# Patient Record
Sex: Male | Born: 1960 | Race: Black or African American | Hispanic: No | Smoking: Former smoker
Health system: Southern US, Community
[De-identification: ages and names within clinical notes are randomized; demographics above are authoritative.]

## PROBLEM LIST (undated history)

## (undated) DIAGNOSIS — J189 Pneumonia, unspecified organism: Secondary | ICD-10-CM

## (undated) DIAGNOSIS — C4491 Basal cell carcinoma of skin, unspecified: Secondary | ICD-10-CM

## (undated) DIAGNOSIS — C801 Malignant (primary) neoplasm, unspecified: Secondary | ICD-10-CM

## (undated) HISTORY — DX: Basal cell carcinoma of skin, unspecified: C44.91

## (undated) HISTORY — PX: APPENDECTOMY: SHX54

---

## 2006-01-29 ENCOUNTER — Ambulatory Visit (HOSPITAL_COMMUNITY): Admission: RE | Admit: 2006-01-29 | Discharge: 2006-01-29 | Payer: Self-pay

## 2020-04-17 DIAGNOSIS — C189 Malignant neoplasm of colon, unspecified: Secondary | ICD-10-CM

## 2020-04-17 HISTORY — DX: Malignant neoplasm of colon, unspecified: C18.9

## 2020-05-09 ENCOUNTER — Telehealth: Payer: Self-pay | Admitting: Hematology and Oncology

## 2020-05-09 NOTE — Telephone Encounter (Signed)
Created in error

## 2020-05-10 ENCOUNTER — Other Ambulatory Visit: Payer: Self-pay | Admitting: Surgery

## 2020-05-10 DIAGNOSIS — C189 Malignant neoplasm of colon, unspecified: Secondary | ICD-10-CM

## 2020-05-24 ENCOUNTER — Ambulatory Visit (HOSPITAL_BASED_OUTPATIENT_CLINIC_OR_DEPARTMENT_OTHER): Admission: RE | Admit: 2020-05-24 | Payer: No Typology Code available for payment source | Source: Ambulatory Visit

## 2020-06-04 ENCOUNTER — Ambulatory Visit
Admission: RE | Admit: 2020-06-04 | Discharge: 2020-06-04 | Disposition: A | Discharge status: Home / Self Care | Payer: No Typology Code available for payment source | Source: Ambulatory Visit | Attending: Surgery | Admitting: Surgery

## 2020-06-04 DIAGNOSIS — C189 Malignant neoplasm of colon, unspecified: Secondary | ICD-10-CM

## 2020-06-04 MED ORDER — IOPAMIDOL (ISOVUE-300) INJECTION 61%
75.0000 mL | Freq: Once | INTRAVENOUS | Status: AC | PRN
Start: 2020-06-04 — End: 2020-06-04
  Administered 2020-06-04: 75 mL via INTRAVENOUS

## 2020-06-05 NOTE — Patient Instructions (Signed)
DUE TO COVID-19 ONLY ONE VISITOR IS ALLOWED TO COME WITH YOU AND STAY IN THE WAITING ROOM ONLY DURING PRE OP AND PROCEDURE DAY OF SURGERY. THE 1 VISITOR  MAY VISIT WITH YOU AFTER SURGERY IN YOUR PRIVATE ROOM DURING VISITING HOURS ONLY!  YOU NEED TO HAVE A COVID 19 TEST ON Jun 11, 2020 @ 10:55 am, THIS TEST MUST BE DONE BEFORE SURGERY,  COVID TESTING SITE [address] [place] [address], IT IS ON THE RIGHT GOING OUT [address] APPROXIMATELY  2 MINUTES PAST [employer] ON THE RIGHT. ONCE YOUR COVID TEST IS COMPLETED,  PLEASE BEGIN THE QUARANTINE INSTRUCTIONS AS OUTLINED IN YOUR HANDOUT.                Andrew Hines  06/05/2020   Your procedure is scheduled on: Jun 13, 2020   Report to [facility] Main  Entrance   Report to admitting at 12:45 PM     Call this number if you have problems the morning of surgery [phone]    Remember: Do not eat food after Midnight. Clear liquids from midnight till 11:45 am day of surgery then nothing by mouth.  BRUSH YOUR TEETH MORNING OF SURGERY AND RINSE YOUR MOUTH OUT, NO CHEWING GUM CANDY OR MINTS.     Take these medicines the morning of surgery with A SIP OF WATER:   DO NOT TAKE ANY DIABETIC MEDICATIONS DAY OF YOUR SURGERY                               You may not have any metal on your body including hair pins and              piercings  Do not wear jewelry, make-up, lotions, powders or perfumes, deodorant             Do not wear nail polish on your fingernails.  Do not shave  48 hours prior to surgery.              Men may shave face and neck.   Do not bring valuables to the hospital. [place].  Contacts, dentures or bridgework may not be worn into surgery.  Leave suitcase in the car. After surgery it may be brought to your room.     Patients discharged the day of surgery will not be allowed to drive home. IF YOU ARE HAVING SURGERY AND GOING HOME THE SAME DAY,  YOU MUST HAVE AN ADULT TO DRIVE YOU HOME AND BE WITH YOU FOR 24 HOURS. YOU MAY GO HOME BY TAXI OR UBER OR ORTHERWISE, BUT AN ADULT MUST ACCOMPANY YOU HOME AND STAY WITH YOU FOR 24 HOURS.  Name and phone number of your driver:  Special Instructions: N/A              Please read over the following fact sheets you were given: _____________________________________________________________________                [place]  Foods Excluded  Coffee and tea, regular and decaf                             liquids that you cannot  Plain Jell-O any favor except red or purple                                           see through such as: Fruit ices (not with fruit pulp)                                     milk, soups, orange juice  Iced Popsicles                                    All solid food Carbonated beverages, regular and diet                                    Cranberry, grape and apple juices Sports drinks like Gatorade Lightly seasoned clear broth or consume(fat free) Sugar, honey syrup  Sample Menu Breakfast                                Lunch                                     Supper Cranberry juice                    Beef broth                            Chicken broth Jell-O                                     Grape juice                           Apple juice Coffee or tea                        Jell-O                                      Popsicle                                                Coffee or tea                        Coffee or tea  _____________________________________________________________________  [facility] - Preparing for Surgery Before surgery, you can play an important role.  Because skin is not sterile, your skin  needs to be as free of germs as possible.  You can reduce the number of germs on your skin by washing with CHG (chlorahexidine gluconate) soap before  surgery.  CHG is an antiseptic cleaner which kills germs and bonds with the skin to continue killing germs even after washing. Please DO NOT use if you have an allergy to CHG or antibacterial soaps.  If your skin becomes reddened/irritated stop using the CHG and inform your nurse when you arrive at Short Stay. Do not shave (including legs and underarms) for at least 48 hours prior to the first CHG shower.  You may shave your face/neck. Please follow these instructions carefully:  1.  Shower with CHG Soap the night before surgery and the  morning of Surgery.  2.  If you choose to wash your hair, wash your hair first as usual with your  normal  shampoo.  3.  After you shampoo, rinse your hair and body thoroughly to remove the  shampoo.                           4.  Use CHG as you would any other liquid soap.  You can apply chg directly  to the skin and wash                       Gently with a scrungie or clean washcloth.  5.  Apply the CHG Soap to your body ONLY FROM THE NECK DOWN.   Do not use on face/ open                           Wound or open sores. Avoid contact with eyes, ears mouth and genitals (private parts).                       Wash face,  Genitals (private parts) with your normal soap.             6.  Wash thoroughly, paying special attention to the area where your surgery  will be performed.  7.  Thoroughly rinse your body with warm water from the neck down.  8.  DO NOT shower/wash with your normal soap after using and rinsing off  the CHG Soap.                9.  Pat yourself dry with a clean towel.            10.  Wear clean pajamas.            11.  Place clean sheets on your bed the night of your first shower and do not  sleep with pets. Day of Surgery : Do not apply any lotions/deodorants the morning of surgery.  Please wear clean clothes to the hospital/surgery center.  FAILURE TO FOLLOW THESE INSTRUCTIONS MAY RESULT IN THE CANCELLATION OF YOUR SURGERY PATIENT  SIGNATURE_________________________________  NURSE SIGNATURE__________________________________  ________________________________________________________________________ [facility] - Preparing for Surgery Before surgery, you can play an important role.  Because skin is not sterile, your skin needs to be as free of germs as possible.  You can reduce the number of germs on your skin by washing with CHG (chlorahexidine gluconate) soap before surgery.  CHG is an antiseptic cleaner which kills germs and bonds with the skin to continue killing germs even after washing. Please DO NOT use if you have  an allergy to CHG or antibacterial soaps.  If your skin becomes reddened/irritated stop using the CHG and inform your nurse when you arrive at Short Stay. Do not shave (including legs and underarms) for at least 48 hours prior to the first CHG shower.  You may shave your face/neck. Please follow these instructions carefully:  1.  Shower with CHG Soap the night before surgery and the  morning of Surgery.  2.  If you choose to wash your hair, wash your hair first as usual with your  normal  shampoo.  3.  After you shampoo, rinse your hair and body thoroughly to remove the  shampoo.                           4.  Use CHG as you would any other liquid soap.  You can apply chg directly  to the skin and wash                       Gently with a scrungie or clean washcloth.  5.  Apply the CHG Soap to your body ONLY FROM THE NECK DOWN.   Do not use on face/ open                           Wound or open sores. Avoid contact with eyes, ears mouth and genitals (private parts).                       Wash face,  Genitals (private parts) with your normal soap.             6.  Wash thoroughly, paying special attention to the area where your surgery  will be performed.  7.  Thoroughly rinse your body with warm water from the neck down.  8.  DO NOT shower/wash with your normal soap after using and rinsing off  the CHG Soap.                 9.  Pat yourself dry with a clean towel.            10.  Wear clean pajamas.            11.  Place clean sheets on your bed the night of your first shower and do not  sleep with pets. Day of Surgery : Do not apply any lotions/deodorants the morning of surgery.  Please wear clean clothes to the hospital/surgery center.  FAILURE TO FOLLOW THESE INSTRUCTIONS MAY RESULT IN THE CANCELLATION OF YOUR SURGERY PATIENT SIGNATURE_________________________________  NURSE SIGNATURE__________________________________  ________________________________________________________________________

## 2020-06-05 NOTE — Progress Notes (Signed)
Please place surgical orders in epic for surgical procedure scheduled on Jun 13, 2020. Thank You.

## 2020-06-06 ENCOUNTER — Encounter (HOSPITAL_COMMUNITY)
Admission: RE | Admit: 2020-06-06 | Discharge: 2020-06-06 | Disposition: A | Discharge status: Home / Self Care | Payer: No Typology Code available for payment source | Source: Ambulatory Visit | Attending: Surgery | Admitting: Surgery

## 2020-06-06 ENCOUNTER — Ambulatory Visit: Payer: Self-pay | Admitting: Surgery

## 2020-06-06 ENCOUNTER — Other Ambulatory Visit: Payer: Self-pay

## 2020-06-06 ENCOUNTER — Encounter (HOSPITAL_COMMUNITY): Payer: Self-pay

## 2020-06-06 DIAGNOSIS — Z01812 Encounter for preprocedural laboratory examination: Secondary | ICD-10-CM | POA: Insufficient documentation

## 2020-06-06 HISTORY — DX: Malignant (primary) neoplasm, unspecified: C80.1

## 2020-06-06 HISTORY — DX: Pneumonia, unspecified organism: J18.9

## 2020-06-06 LAB — CBC
HCT: 41 % (ref 39.0–52.0)
Hemoglobin: 13.1 g/dL (ref 13.0–17.0)
MCH: 28.4 pg (ref 26.0–34.0)
MCHC: 32 g/dL (ref 30.0–36.0)
MCV: 88.9 fL (ref 80.0–100.0)
Platelets: 321 K/uL (ref 150–400)
RBC: 4.61 MIL/uL (ref 4.22–5.81)
RDW: 13.3 % (ref 11.5–15.5)
WBC: 4.9 K/uL (ref 4.0–10.5)
nRBC: 0 % (ref 0.0–0.2)

## 2020-06-06 LAB — DIFFERENTIAL
Abs Immature Granulocytes: 0.01 10*3/uL (ref 0.00–0.07)
Basophils Absolute: 0 10*3/uL (ref 0.0–0.1)
Basophils Relative: 1 %
Eosinophils Absolute: 0 10*3/uL (ref 0.0–0.5)
Eosinophils Relative: 1 %
Immature Granulocytes: 0 %
Lymphocytes Relative: 25 %
Lymphs Abs: 1.3 10*3/uL (ref 0.7–4.0)
Monocytes Absolute: 0.5 10*3/uL (ref 0.1–1.0)
Monocytes Relative: 11 %
Neutro Abs: 3.1 10*3/uL (ref 1.7–7.7)
Neutrophils Relative %: 62 %

## 2020-06-06 LAB — COMPREHENSIVE METABOLIC PANEL WITH GFR
ALT: 12 U/L (ref 0–44)
AST: 32 U/L (ref 15–41)
Albumin: 4 g/dL (ref 3.5–5.0)
Alkaline Phosphatase: 77 U/L (ref 38–126)
Anion gap: 10 (ref 5–15)
BUN: 14 mg/dL (ref 8–23)
CO2: 22 mmol/L (ref 22–32)
Calcium: 9.3 mg/dL (ref 8.9–10.3)
Chloride: 110 mmol/L (ref 98–111)
Creatinine, Ser: 1 mg/dL (ref 0.61–1.24)
GFR, Estimated: >60 mL/min (ref >60)
Glucose, Bld: 92 mg/dL (ref 70–99)
Potassium: 4.3 mmol/L (ref 3.5–5.1)
Sodium: 142 mmol/L (ref 135–145)
Total Bilirubin: 0.9 mg/dL (ref 0.3–1.2)
Total Protein: 7.7 g/dL (ref 6.5–8.1)

## 2020-06-06 NOTE — Progress Notes (Addendum)
Called [facility] and Spoke with Elmo Putt in Triage.  ORders for surgery not in until after preop appt.  Asked if could Call pt and have pt drink 2 gatorades at 1000pm the nite before surgery and one gatorade am of surgery .  Aliciia to ask DR Dema Severin and call me back.  Alicia from [facility] called back to state per DR Dema Severin it was okay for pt to obtain 3 Gatorades for preop drinks for surgery since orders were placed after preop appt.

## 2020-06-06 NOTE — H&P (Signed)
CC: Referred for newly diagnosed colon cancer - proximal ascending colon  HPI: Andrew Hines is a very pleasant 60yoM with hx of ED, HLD, who underwent his first colonoscopy at [facility] 04/17/20 with Dr. Alan Ripper. This was done for the purposes of blood in his stool when she first noticed approximately 1 month ago. He was found to have a mass in the proximal ascending colon that was one third the circumference of the lumen, nonobstructing, ulcerated, friable. Biopsies were obtained in this lesion was tattooed proximally and distally. A small polyp was removed from the descending colon.  Path: Pathology returned of the proximal ascending colon mass showing invasive moderately differentiated adenocarcinoma arising in a tubular adenoma with high-grade dysplasia. Descending colon polyp returned as a tubular adenoma.  He denies any complaints at this time. He reports that the blood in his stool he had noted has completely resolved. He denies any weight changes. He denies any nausea or vomiting. He denies any abdominal pain.  CT Abd/pelvis at [facility] 04/17/20 - done right after his colonoscopy showed large amount of gas;Marland Kitchen No obvious masses per se. Right renal cysts with probable hepatic cysts without definite evidence of metastatic disease. Right inguinal hernia. The lesion in the liver is 6 mm and low density. It did not enhance.  PMH: ED, HLD  PSH: Open appendectomy, 1983.  FHx: His father had metastatic prostate ca but ultimately died of lung cancer. He denies FHx of breast, endometrial, ovarian or cervical cancer  Social: Denies use of tobacco/drugs; 2-3 shots of gin per day. He works as a Librarian, academic at [employer]  ROS: A comprehensive 10 system review of systems was completed with the patient and pertinent findings as noted above.  The patient is a 60 year old male.   Past Surgical History Carrolyn Leigh, [place]; 23-May-2020 8:56 AM) Appendectomy  Colon Polyp  Removal - Colonoscopy   Diagnostic Studies History Car[place]lyn Leigh, [place]; 2020/05/23 8:56 AM) Colonoscopy  within last year  Social History Car[place]lyn Leigh, [place]; May 23, 2020 8:56 AM) Alcohol use  Moderate alcohol use. Caffeine use  Carbonated beverages, Coffee, Tea. No drug use  Tobacco use  Former smoker.  Family History Carrolyn Leigh, [place]; 05-23-20 8:56 AM) Alcohol Abuse  Father. Colon Cancer  Father. Colon Polyps  Mother. Hypertension  Brother, Mother, Sister. Prostate Cancer  Father. Respiratory Condition  Father.  Other Problems Carrolyn Leigh, [place]; 2020/05/23 8:56 AM) Depression  Hemorrhoids     Review of Systems Carrolyn Leigh CMA; 2020-05-23 8:56 AM) General Present- Weight Loss. Not Present- Appetite Loss, Chills, Fatigue, Fever, Night Sweats and Weight Gain. Skin Not Present- Change in Wart/Mole, Dryness, Hives, Jaundice, New Lesions, Non-Healing Wounds, Rash and Ulcer. HEENT Not Present- Earache, Hearing Loss, Hoarseness, Nose Bleed, Oral Ulcers, Ringing in the Ears, Seasonal Allergies, Sinus Pain, Sore Throat, Visual Disturbances, Wears glasses/contact lenses and Yellow Eyes. Breast Not Present- Breast Mass, Breast Pain, Nipple Discharge and Skin Changes. Cardiovascular Not Present- Chest Pain, Difficulty Breathing Lying Down, Leg Cramps, Palpitations, Rapid Heart Rate, Shortness of Breath and Swelling of Extremities. Gastrointestinal Present- Hemorrhoids. Not Present- Abdominal Pain, Bloating, Bloody Stool, Change in Bowel Habits, Chronic diarrhea, Constipation, Difficulty Swallowing, Excessive gas, Gets full quickly at meals, Indigestion, Nausea, Rectal Pain and Vomiting. Male Genitourinary Not Present- Blood in Urine, Change in Urinary Stream, Frequency, Impotence, Nocturia, Painful Urination, Urgency and Urine Leakage. Musculoskeletal Not Present- Back Pain, Joint Pain, Joint Stiffness, Muscle Pain, Muscle Weakness and Swelling of Extremities. Neurological Not Present-  Decreased  Memory, Fainting, Headaches, Numbness, Seizures, Tingling, Tremor, Trouble walking and Weakness. Psychiatric Not Present- Anxiety, Bipolar, Change in Sleep Pattern, Depression, Fearful and Frequent crying. Endocrine Not Present- Cold Intolerance, Excessive Hunger, Hair Changes, Heat Intolerance, Hot flashes and New Diabetes. Hematology Not Present- Blood Thinners, Easy Bruising, Excessive bleeding, Gland problems, HIV and Persistent Infections.  Vitals Arville Go Fox CMA; 05/09/2020 8:57 AM) 05/09/2020 8:57 AM Height: 67in Temp.: 97.46F  Pulse: 97 (Regular)  P.OX: 99% (Room air) BP: 140/82(Sitting, Left Arm, Standard)       Physical Exam [place] Gave M. Shalunda Lindh MD; 05/09/2020 9:33 AM) The physical exam findings are as follows: Note: Constitutional: No acute distress; conversant; no deformities; wearing mask Eyes: Moist conjunctiva; no lid lag; anicteric sclerae; pupils equal and round Neck: Trachea midline; no palpable thyromegaly Lungs: Normal respiratory effort; no tactile fremitus CV: rrr; no palpable thrill; no pitting edema GI: Abdomen soft, nontender, nondistended; no palpable hepatosplenomegaly MSK: Normal gait; no clubbing/cyanosis Psychiatric: Appropriate affect; alert and oriented 3 Lymphatic: No palpable cervical or axillary lymphadenopathy    Assessment & Plan [place] Gave M. Jevan Gaunt MD; 05/09/2020 9:39 AM) COLON CANCER (C18.9) Story: Andrew Hines is a very pleasant 60yoM with hx of HLD, ED here for evaluation of newly diagnosed adenocarcinoma in proximal ascending colon - tattoo'd prox + distally CT A/P 04/17/20 - no evident metastatic dz; small 6 mm low denisity liver lesion which did not enhance - favored cyst Impression: -CT chest with IV contrast -The anatomy and physiology of the GI tract was reviewed with him today as well as the pathophysiology of colon cancers with associated pictures. -We discussed laparoscopic right hemicolectomy to address his malignancy, provided  CT chest is without evidence of metastatic disease -The planned procedure, material risks (including, but not limited to, pain, bleeding, infection, scarring, need for blood transfusion, damage to surrounding structures- blood vessels/nerves/viscus/organs, damage to ureter, leak from anastomosis, need for additional procedures, worsening of pre-existing medical conditions, need for stoma which may be permanent, hernia formation, recurrence of cancer despite surgery, DVT/PE, pneumonia, heart attack, stroke, death) benefits and alternatives to surgery were discussed at length. The patient's questions were answered to his satisfaction, he voiced understanding and elected to proceed with surgery. Additionally, we discussed typical postoperative expectations and the recovery process. -We also reviewed potential for additional treatment being recommended based on pathology results following surgery  This patient encounter took 65 minutes today to perform the following: take history, perform exam, review outside records, interpret imaging, counsel the patient on their diagnosis and document encounter, findings & plan in the EHR  Signed by Ileana Roup, MD (05/09/2020 9:39 AM)

## 2020-06-06 NOTE — Progress Notes (Signed)
DUE TO COVID-19 ONLY ONE VISITOR IS ALLOWED TO COME WITH YOU AND STAY IN THE WAITING ROOM ONLY DURING PRE OP AND PROCEDURE DAY OF SURGERY. THE 1 VISITOR  MAY VISIT WITH YOU AFTER SURGERY IN YOUR PRIVATE ROOM DURING VISITING HOURS ONLY!  YOU NEED TO HAVE A COVID 19 TEST ON_5/2/22______ @_______ , THIS TEST MUST BE DONE BEFORE SURGERY,  COVID TESTING SITE [address] [place] [address], IT IS ON THE RIGHT GOING OUT [address] APPROXIMATELY  2 MINUTES PAST [employer] ON THE RIGHT. ONCE YOUR COVID TEST IS COMPLETED,  PLEASE BEGIN THE QUARANTINE INSTRUCTIONS AS OUTLINED IN YOUR HANDOUT.                Andrew Hines  06/06/2020   Your procedure is scheduled on:  06/13/20  Report to [facility] Main  Entrance   Report to admitting at     1245pm     Call this number if you have problems the morning of surgery [phone]    Remember: Do not eat food , candy gum or mints :After Midnight. You may have clear liquids from midnight until 1145am     CLEAR LIQUID DIET   Foods Allowed                                                                       Coffee and tea, regular and decaf                              Plain Jell-O any favor except red or purple                                            Fruit ices (not with fruit pulp)                                      Iced Popsicles                                     Carbonated beverages, regular and diet                                    Cranberry, grape and apple juices Sports drinks like Gatorade Lightly seasoned clear broth or consume(fat free) Sugar, honey syrup   _____________________________________________________________________    BRUSH YOUR TEETH MORNING OF SURGERY AND RINSE YOUR MOUTH OUT, NO CHEWING GUM CANDY OR MINTS.     Take these medicines the morning of surgery with A SIP OF WATER: none   DO NOT TAKE ANY DIABETIC MEDICATIONS DAY OF YOUR SURGERY                               You may  not have any metal on your body including  hair pins and              piercings  Do not wear jewelry, make-up, lotions, powders or perfumes, deodorant             Do not wear nail polish on your fingernails.  Do not shave  48 hours prior to surgery.              Men may shave face and neck.   Do not bring valuables to the hospital. [place].  Contacts, dentures or bridgework may not be worn into surgery.  Leave suitcase in the car. After surgery it may be brought to your room.     Patients discharged the day of surgery will not be allowed to drive home. IF YOU ARE HAVING SURGERY AND GOING HOME THE SAME DAY, YOU MUST HAVE AN ADULT TO DRIVE YOU HOME AND BE WITH YOU FOR 24 HOURS. YOU MAY GO HOME BY TAXI OR UBER OR ORTHERWISE, BUT AN ADULT MUST ACCOMPANY YOU HOME AND STAY WITH YOU FOR 24 HOURS.  Name and phone number of your driver:  Special Instructions: N/A              Please read over the following fact sheets you were given: _____________________________________________________________________  [facility] - Preparing for Surgery Before surgery, you can play an important role.  Because skin is not sterile, your skin needs to be as free of germs as possible.  You can reduce the number of germs on your skin by washing with CHG (chlorahexidine gluconate) soap before surgery.  CHG is an antiseptic cleaner which kills germs and bonds with the skin to continue killing germs even after washing. Please DO NOT use if you have an allergy to CHG or antibacterial soaps.  If your skin becomes reddened/irritated stop using the CHG and inform your nurse when you arrive at Short Stay. Do not shave (including legs and underarms) for at least 48 hours prior to the first CHG shower.  You may shave your face/neck. Please follow these instructions carefully:  1.  Shower with CHG Soap the night before surgery and the  morning of Surgery.  2.  If you choose to wash  your hair, wash your hair first as usual with your  normal  shampoo.  3.  After you shampoo, rinse your hair and body thoroughly to remove the  shampoo.                           4.  Use CHG as you would any other liquid soap.  You can apply chg directly  to the skin and wash                       Gently with a scrungie or clean washcloth.  5.  Apply the CHG Soap to your body ONLY FROM THE NECK DOWN.   Do not use on face/ open                           Wound or open sores. Avoid contact with eyes, ears mouth and genitals (private parts).                       Wash face,  Genitals (private parts) with  your normal soap.             6.  Wash thoroughly, paying special attention to the area where your surgery  will be performed.  7.  Thoroughly rinse your body with warm water from the neck down.  8.  DO NOT shower/wash with your normal soap after using and rinsing off  the CHG Soap.                9.  Pat yourself dry with a clean towel.            10.  Wear clean pajamas.            11.  Place clean sheets on your bed the night of your first shower and do not  sleep with pets. Day of Surgery : Do not apply any lotions/deodorants the morning of surgery.  Please wear clean clothes to the hospital/surgery center.  FAILURE TO FOLLOW THESE INSTRUCTIONS MAY RESULT IN THE CANCELLATION OF YOUR SURGERY PATIENT SIGNATURE_________________________________  NURSE SIGNATURE__________________________________  ________________________________________________________________________

## 2020-06-06 NOTE — Progress Notes (Signed)
Called and [facility] for pt to call me back regarding preop instrucitons for surgery on 5/4/2.  Left call back number of [phone].

## 2020-06-08 NOTE — Progress Notes (Signed)
PT has not yet returned call for nurse to give him further preop instructions regardin gsurgery o f 06/13/20.  [facility] again for pt to call back with call back number of [phone].  Between the hourse of 8-430pm.

## 2020-06-11 ENCOUNTER — Other Ambulatory Visit (HOSPITAL_COMMUNITY)
Admission: RE | Admit: 2020-06-11 | Discharge: 2020-06-11 | Disposition: A | Discharge status: Home / Self Care | Payer: No Typology Code available for payment source | Source: Ambulatory Visit | Attending: Surgery | Admitting: Surgery

## 2020-06-11 DIAGNOSIS — Z20822 Contact with and (suspected) exposure to covid-19: Secondary | ICD-10-CM | POA: Insufficient documentation

## 2020-06-11 DIAGNOSIS — Z01812 Encounter for preprocedural laboratory examination: Secondary | ICD-10-CM | POA: Insufficient documentation

## 2020-06-11 NOTE — Progress Notes (Signed)
Patient called back.  Patient instructed per instruction of DR Dema Severin to drink 2 Gatorade drinks ( 8-10 ounces ) at 1000pm nite before surgery and one ( 8-10 ounce) of Gatorade am of surgery and have completed by 1145 am of surgery.  PT instructed to either drink the orange or blue or clear gatorade.  Patient voiced understanding.

## 2020-06-12 LAB — SARS CORONAVIRUS 2 (TAT 6-24 HRS): SARS Coronavirus 2: NEGATIVE

## 2020-06-12 MED ORDER — BUPIVACAINE LIPOSOME 1.3 % IJ SUSP
20.0000 mL | Freq: Once | INTRAMUSCULAR | Status: DC
  Filled 2020-06-12: qty 20

## 2020-06-13 ENCOUNTER — Inpatient Hospital Stay (HOSPITAL_COMMUNITY)
Admission: RE | Admit: 2020-06-13 | Discharge: 2020-06-17 | DRG: 330 | Disposition: A | Discharge status: Home / Self Care | Payer: No Typology Code available for payment source | Attending: Surgery | Admitting: Surgery

## 2020-06-13 ENCOUNTER — Inpatient Hospital Stay (HOSPITAL_COMMUNITY): Payer: No Typology Code available for payment source | Admitting: Anesthesiology

## 2020-06-13 ENCOUNTER — Other Ambulatory Visit: Payer: Self-pay

## 2020-06-13 ENCOUNTER — Encounter (HOSPITAL_COMMUNITY)
Admission: RE | Disposition: A | Discharge status: Home / Self Care | Payer: Self-pay | Source: Other Acute Inpatient Hospital | Attending: Surgery

## 2020-06-13 ENCOUNTER — Encounter (HOSPITAL_COMMUNITY): Payer: Self-pay | Admitting: Surgery

## 2020-06-13 DIAGNOSIS — C182 Malignant neoplasm of ascending colon: Principal | ICD-10-CM | POA: Diagnosis present

## 2020-06-13 DIAGNOSIS — K921 Melena: Secondary | ICD-10-CM | POA: Diagnosis not present

## 2020-06-13 DIAGNOSIS — C772 Secondary and unspecified malignant neoplasm of intra-abdominal lymph nodes: Secondary | ICD-10-CM | POA: Diagnosis present

## 2020-06-13 DIAGNOSIS — K660 Peritoneal adhesions (postprocedural) (postinfection): Secondary | ICD-10-CM | POA: Diagnosis present

## 2020-06-13 DIAGNOSIS — Z8042 Family history of malignant neoplasm of prostate: Secondary | ICD-10-CM

## 2020-06-13 DIAGNOSIS — E785 Hyperlipidemia, unspecified: Secondary | ICD-10-CM | POA: Diagnosis present

## 2020-06-13 DIAGNOSIS — Z85828 Personal history of other malignant neoplasm of skin: Secondary | ICD-10-CM

## 2020-06-13 DIAGNOSIS — Z801 Family history of malignant neoplasm of trachea, bronchus and lung: Secondary | ICD-10-CM | POA: Diagnosis not present

## 2020-06-13 DIAGNOSIS — Z87891 Personal history of nicotine dependence: Secondary | ICD-10-CM

## 2020-06-13 DIAGNOSIS — Z9049 Acquired absence of other specified parts of digestive tract: Secondary | ICD-10-CM

## 2020-06-13 DIAGNOSIS — Z20822 Contact with and (suspected) exposure to covid-19: Secondary | ICD-10-CM | POA: Diagnosis present

## 2020-06-13 HISTORY — PX: LAPAROSCOPIC RIGHT HEMI COLECTOMY: SHX5926

## 2020-06-13 LAB — PROTIME-INR
INR: 1 (ref 0.8–1.2)
Prothrombin Time: 13.1 seconds (ref 11.4–15.2)

## 2020-06-13 LAB — TYPE AND SCREEN
ABO/RH(D): O NEG
Antibody Screen: NEGATIVE

## 2020-06-13 LAB — HEMOGLOBIN A1C
Hgb A1c MFr Bld: 5.6 % (ref 4.8–5.6)
Mean Plasma Glucose: 114.02 mg/dL

## 2020-06-13 LAB — ABO/RH: ABO/RH(D): O NEG

## 2020-06-13 LAB — APTT: aPTT: 35 seconds (ref 24–36)

## 2020-06-13 SURGERY — LAPAROSCOPIC RIGHT HEMI COLECTOMY
Anesthesia: General | Site: Abdomen

## 2020-06-13 MED ORDER — DEXAMETHASONE SODIUM PHOSPHATE 10 MG/ML IJ SOLN
INTRAMUSCULAR | Status: AC
  Filled 2020-06-13: qty 1

## 2020-06-13 MED ORDER — CHLORHEXIDINE GLUCONATE CLOTH 2 % EX PADS: 6.0000 | MEDICATED_PAD | Freq: Once | CUTANEOUS | Status: DC

## 2020-06-13 MED ORDER — FENTANYL CITRATE (PF) 250 MCG/5ML IJ SOLN
INTRAMUSCULAR | Status: AC
  Filled 2020-06-13: qty 5

## 2020-06-13 MED ORDER — KETAMINE HCL 10 MG/ML IJ SOLN
INTRAMUSCULAR | Status: AC
  Filled 2020-06-13: qty 1

## 2020-06-13 MED ORDER — LIDOCAINE HCL 2 % IJ SOLN
INTRAMUSCULAR | Status: AC
  Filled 2020-06-13: qty 20

## 2020-06-13 MED ORDER — ONDANSETRON HCL 4 MG/2ML IJ SOLN
INTRAMUSCULAR | Status: AC
  Filled 2020-06-13: qty 2

## 2020-06-13 MED ORDER — IBUPROFEN 400 MG PO TABS: 600.0000 mg | ORAL_TABLET | Freq: Four times a day (QID) | ORAL | Status: DC | PRN

## 2020-06-13 MED ORDER — ROCURONIUM BROMIDE 10 MG/ML (PF) SYRINGE
PREFILLED_SYRINGE | INTRAVENOUS | Status: DC | PRN
  Administered 2020-06-13: 10 mg via INTRAVENOUS
  Administered 2020-06-13: 100 mg via INTRAVENOUS

## 2020-06-13 MED ORDER — FENTANYL CITRATE (PF) 250 MCG/5ML IJ SOLN
INTRAMUSCULAR | Status: DC | PRN
  Administered 2020-06-13: 50 ug via INTRAVENOUS
  Administered 2020-06-13: 100 ug via INTRAVENOUS

## 2020-06-13 MED ORDER — POLYETHYLENE GLYCOL 3350 17 GM/SCOOP PO POWD: 1.0000 | Freq: Once | ORAL | Status: DC

## 2020-06-13 MED ORDER — SUGAMMADEX SODIUM 200 MG/2ML IV SOLN
INTRAVENOUS | Status: DC | PRN
  Administered 2020-06-13: 200 mg via INTRAVENOUS

## 2020-06-13 MED ORDER — PHENYLEPHRINE 40 MCG/ML (10ML) SYRINGE FOR IV PUSH (FOR BLOOD PRESSURE SUPPORT)
PREFILLED_SYRINGE | INTRAVENOUS | Status: DC | PRN
  Administered 2020-06-13: 200 ug via INTRAVENOUS

## 2020-06-13 MED ORDER — LIDOCAINE 20MG/ML (2%) 15 ML SYRINGE OPTIME
INTRAMUSCULAR | Status: DC | PRN
  Administered 2020-06-13: 1 mg/kg/h via INTRAVENOUS

## 2020-06-13 MED ORDER — ONDANSETRON HCL 4 MG/2ML IJ SOLN
4.0000 mg | Freq: Four times a day (QID) | INTRAMUSCULAR | Status: DC | PRN
  Administered 2020-06-15: 4 mg via INTRAVENOUS
  Filled 2020-06-13: qty 2

## 2020-06-13 MED ORDER — BISACODYL 5 MG PO TBEC: 20.0000 mg | DELAYED_RELEASE_TABLET | Freq: Once | ORAL | Status: DC

## 2020-06-13 MED ORDER — LACTATED RINGERS IV SOLN: INTRAVENOUS | Status: DC

## 2020-06-13 MED ORDER — NEOMYCIN SULFATE 500 MG PO TABS: 1000.0000 mg | ORAL_TABLET | ORAL | Status: DC

## 2020-06-13 MED ORDER — BUPIVACAINE LIPOSOME 1.3 % IJ SUSP
INTRAMUSCULAR | Status: DC | PRN
  Administered 2020-06-13: 20 mL

## 2020-06-13 MED ORDER — ACETAMINOPHEN 500 MG PO TABS
1000.0000 mg | ORAL_TABLET | Freq: Four times a day (QID) | ORAL | Status: DC
  Administered 2020-06-13 – 2020-06-16 (×9): 1000 mg via ORAL
  Filled 2020-06-13 (×13): qty 2

## 2020-06-13 MED ORDER — MIDAZOLAM HCL 5 MG/5ML IJ SOLN
INTRAMUSCULAR | Status: DC | PRN
  Administered 2020-06-13: 2 mg via INTRAVENOUS

## 2020-06-13 MED ORDER — ONDANSETRON HCL 4 MG PO TABS: 4.0000 mg | ORAL_TABLET | Freq: Four times a day (QID) | ORAL | Status: DC | PRN

## 2020-06-13 MED ORDER — ACETAMINOPHEN 500 MG PO TABS
1000.0000 mg | ORAL_TABLET | ORAL | Status: AC
  Administered 2020-06-13: 1000 mg via ORAL
  Filled 2020-06-13: qty 2

## 2020-06-13 MED ORDER — ALVIMOPAN 12 MG PO CAPS
12.0000 mg | ORAL_CAPSULE | ORAL | Status: AC
  Administered 2020-06-13: 12 mg via ORAL
  Filled 2020-06-13: qty 1

## 2020-06-13 MED ORDER — DIPHENHYDRAMINE HCL 50 MG/ML IJ SOLN
12.5000 mg | Freq: Four times a day (QID) | INTRAMUSCULAR | Status: DC | PRN
  Administered 2020-06-14 (×2): 12.5 mg via INTRAVENOUS
  Filled 2020-06-13 (×2): qty 1

## 2020-06-13 MED ORDER — ALUM & MAG HYDROXIDE-SIMETH 200-200-20 MG/5ML PO SUSP: 30.0000 mL | Freq: Four times a day (QID) | ORAL | Status: DC | PRN

## 2020-06-13 MED ORDER — LACTATED RINGERS IR SOLN
Status: DC | PRN
Start: 2020-06-13 — End: 2020-06-13
  Administered 2020-06-13: 1000 mL

## 2020-06-13 MED ORDER — SIMETHICONE 80 MG PO CHEW: 40.0000 mg | CHEWABLE_TABLET | Freq: Four times a day (QID) | ORAL | Status: DC | PRN

## 2020-06-13 MED ORDER — FENTANYL CITRATE (PF) 100 MCG/2ML IJ SOLN
25.0000 ug | INTRAMUSCULAR | Status: DC | PRN
  Administered 2020-06-13 (×3): 50 ug via INTRAVENOUS

## 2020-06-13 MED ORDER — ROCURONIUM BROMIDE 10 MG/ML (PF) SYRINGE
PREFILLED_SYRINGE | INTRAVENOUS | Status: AC
  Filled 2020-06-13: qty 10

## 2020-06-13 MED ORDER — METRONIDAZOLE 500 MG PO TABS: 1000.0000 mg | ORAL_TABLET | ORAL | Status: DC

## 2020-06-13 MED ORDER — TRAMADOL HCL 50 MG PO TABS
50.0000 mg | ORAL_TABLET | Freq: Four times a day (QID) | ORAL | Status: DC | PRN
  Administered 2020-06-14: 50 mg via ORAL
  Filled 2020-06-13: qty 1

## 2020-06-13 MED ORDER — KETAMINE HCL 10 MG/ML IJ SOLN
INTRAMUSCULAR | Status: DC | PRN
  Administered 2020-06-13: 30 mg via INTRAVENOUS
  Administered 2020-06-13: 20 mg via INTRAVENOUS

## 2020-06-13 MED ORDER — PROPOFOL 10 MG/ML IV BOLUS
INTRAVENOUS | Status: DC | PRN
  Administered 2020-06-13: 150 mg via INTRAVENOUS

## 2020-06-13 MED ORDER — ONDANSETRON HCL 4 MG/2ML IJ SOLN
INTRAMUSCULAR | Status: DC | PRN
  Administered 2020-06-13: 4 mg via INTRAVENOUS

## 2020-06-13 MED ORDER — LACTATED RINGERS IV SOLN: INTRAVENOUS | Status: DC | PRN

## 2020-06-13 MED ORDER — ENSURE PRE-SURGERY PO LIQD
296.0000 mL | Freq: Once | ORAL | Status: DC
  Filled 2020-06-13: qty 296

## 2020-06-13 MED ORDER — TETRAHYDROZOLINE HCL 0.05 % OP SOLN
1.0000 [drp] | Freq: Two times a day (BID) | OPHTHALMIC | Status: DC
  Administered 2020-06-13 – 2020-06-17 (×8): 1 [drp] via OPHTHALMIC
  Filled 2020-06-13: qty 15

## 2020-06-13 MED ORDER — ENSURE PRE-SURGERY PO LIQD
592.0000 mL | Freq: Once | ORAL | Status: DC
  Filled 2020-06-13: qty 592

## 2020-06-13 MED ORDER — LIDOCAINE 2% (20 MG/ML) 5 ML SYRINGE
INTRAMUSCULAR | Status: DC | PRN
  Administered 2020-06-13: 80 mg via INTRAVENOUS

## 2020-06-13 MED ORDER — HEPARIN SODIUM (PORCINE) 5000 UNIT/ML IJ SOLN
5000.0000 [IU] | Freq: Three times a day (TID) | INTRAMUSCULAR | Status: DC
  Administered 2020-06-13 – 2020-06-16 (×10): 5000 [IU] via SUBCUTANEOUS
  Filled 2020-06-13 (×10): qty 1

## 2020-06-13 MED ORDER — MIDAZOLAM HCL 2 MG/2ML IJ SOLN
INTRAMUSCULAR | Status: AC
  Filled 2020-06-13: qty 2

## 2020-06-13 MED ORDER — HYDRALAZINE HCL 20 MG/ML IJ SOLN: 10.0000 mg | INTRAMUSCULAR | Status: DC | PRN

## 2020-06-13 MED ORDER — LABETALOL HCL 5 MG/ML IV SOLN
INTRAVENOUS | Status: DC | PRN
  Administered 2020-06-13 (×2): 2.5 mg via INTRAVENOUS

## 2020-06-13 MED ORDER — PROPOFOL 10 MG/ML IV BOLUS
INTRAVENOUS | Status: AC
  Filled 2020-06-13: qty 20

## 2020-06-13 MED ORDER — HYDROMORPHONE HCL 1 MG/ML IJ SOLN: 0.5000 mg | INTRAMUSCULAR | Status: DC | PRN

## 2020-06-13 MED ORDER — HEPARIN SODIUM (PORCINE) 5000 UNIT/ML IJ SOLN
5000.0000 [IU] | Freq: Once | INTRAMUSCULAR | Status: AC
  Administered 2020-06-13: 5000 [IU] via SUBCUTANEOUS
  Filled 2020-06-13: qty 1

## 2020-06-13 MED ORDER — DIPHENHYDRAMINE HCL 12.5 MG/5ML PO ELIX: 12.5000 mg | ORAL_SOLUTION | Freq: Four times a day (QID) | ORAL | Status: DC | PRN

## 2020-06-13 MED ORDER — PHENYLEPHRINE HCL (PRESSORS) 10 MG/ML IV SOLN
INTRAVENOUS | Status: AC
  Filled 2020-06-13: qty 1

## 2020-06-13 MED ORDER — DEXAMETHASONE SODIUM PHOSPHATE 10 MG/ML IJ SOLN
INTRAMUSCULAR | Status: DC | PRN
  Administered 2020-06-13: 10 mg via INTRAVENOUS

## 2020-06-13 MED ORDER — SODIUM CHLORIDE 0.9 % IR SOLN
Status: DC | PRN
Start: 2020-06-13 — End: 2020-06-13
  Administered 2020-06-13: 2000 mL

## 2020-06-13 MED ORDER — LIDOCAINE 2% (20 MG/ML) 5 ML SYRINGE
INTRAMUSCULAR | Status: AC
  Filled 2020-06-13: qty 5

## 2020-06-13 MED ORDER — ENSURE SURGERY PO LIQD
237.0000 mL | Freq: Two times a day (BID) | ORAL | Status: DC
  Administered 2020-06-14: 237 mL via ORAL

## 2020-06-13 MED ORDER — ALVIMOPAN 12 MG PO CAPS: 12.0000 mg | ORAL_CAPSULE | Freq: Two times a day (BID) | ORAL | Status: DC

## 2020-06-13 MED ORDER — FENTANYL CITRATE (PF) 100 MCG/2ML IJ SOLN
INTRAMUSCULAR | Status: AC
  Filled 2020-06-13: qty 2

## 2020-06-13 MED ORDER — BUPIVACAINE-EPINEPHRINE (PF) 0.25% -1:200000 IJ SOLN
INTRAMUSCULAR | Status: AC
  Filled 2020-06-13: qty 30

## 2020-06-13 MED ORDER — HYDROMORPHONE HCL 1 MG/ML IJ SOLN
0.2500 mg | INTRAMUSCULAR | Status: DC | PRN
Start: 2020-06-13 — End: 2020-06-13
  Administered 2020-06-13 (×2): 0.25 mg via INTRAVENOUS

## 2020-06-13 MED ORDER — BUPIVACAINE-EPINEPHRINE 0.25% -1:200000 IJ SOLN
INTRAMUSCULAR | Status: DC | PRN
  Administered 2020-06-13: 30 mL

## 2020-06-13 MED ORDER — ORAL CARE MOUTH RINSE: 15.0000 mL | Freq: Once | OROMUCOSAL | Status: AC

## 2020-06-13 MED ORDER — PHENYLEPHRINE HCL-NACL 10-0.9 MG/250ML-% IV SOLN
INTRAVENOUS | Status: DC | PRN
  Administered 2020-06-13: 50 ug/min via INTRAVENOUS

## 2020-06-13 MED ORDER — CHLORHEXIDINE GLUCONATE 0.12 % MT SOLN
15.0000 mL | Freq: Once | OROMUCOSAL | Status: AC
  Administered 2020-06-13: 15 mL via OROMUCOSAL

## 2020-06-13 MED ORDER — ACETAMINOPHEN 500 MG PO TABS: 1000.0000 mg | ORAL_TABLET | ORAL | Status: DC

## 2020-06-13 MED ORDER — HYDROMORPHONE HCL 1 MG/ML IJ SOLN
INTRAMUSCULAR | Status: AC
  Filled 2020-06-13: qty 1

## 2020-06-13 MED ORDER — SODIUM CHLORIDE 0.9 % IV SOLN
2.0000 g | INTRAVENOUS | Status: AC
  Administered 2020-06-13: 2 g via INTRAVENOUS
  Filled 2020-06-13: qty 2

## 2020-06-13 SURGICAL SUPPLY — 57 items
APPLIER CLIP ROT 10 11.4 M/L (STAPLE)
BLADE CLIPPER SURG (BLADE) IMPLANT
CABLE HIGH FREQUENCY MONO STRZ (ELECTRODE) ×4 IMPLANT
CELLS DAT CNTRL 66122 CELL SVR (MISCELLANEOUS) IMPLANT
CHLORAPREP W/TINT 26 (MISCELLANEOUS) ×2 IMPLANT
CLIP APPLIE ROT 10 11.4 M/L (STAPLE) IMPLANT
COVER WAND RF STERILE (DRAPES) IMPLANT
DECANTER SPIKE VIAL GLASS SM (MISCELLANEOUS) ×2 IMPLANT
DISSECTOR BLUNT TIP ENDO 5MM (MISCELLANEOUS) IMPLANT
DRSG OPSITE POSTOP 4X6 (GAUZE/BANDAGES/DRESSINGS) ×2 IMPLANT
ELECT REM PT RETURN 15FT ADLT (MISCELLANEOUS) ×2 IMPLANT
GAUZE SPONGE 4X4 12PLY STRL (GAUZE/BANDAGES/DRESSINGS) ×2 IMPLANT
GLOVE SURG ENC MOIS LTX SZ7.5 (GLOVE) ×4 IMPLANT
GLOVE SURG LTX SZ8 (GLOVE) ×4 IMPLANT
GOWN STRL REUS W/TWL XL LVL3 (GOWN DISPOSABLE) ×8 IMPLANT
KIT TURNOVER KIT A (KITS) ×2 IMPLANT
LIGASURE IMPACT 36 18CM CVD LR (INSTRUMENTS) IMPLANT
NS IRRIG 1000ML POUR BTL (IV SOLUTION) ×2 IMPLANT
PACK COLON (CUSTOM PROCEDURE TRAY) ×2 IMPLANT
PAD POSITIONING PINK XL (MISCELLANEOUS) IMPLANT
PENCIL SMOKE EVACUATOR (MISCELLANEOUS) IMPLANT
PROTECTOR NERVE ULNAR (MISCELLANEOUS) IMPLANT
RELOAD PROXIMATE 75MM BLUE (ENDOMECHANICALS) ×4 IMPLANT
RTRCTR WOUND ALEXIS 18CM MED (MISCELLANEOUS)
SCISSORS LAP 5X35 DISP (ENDOMECHANICALS) ×2 IMPLANT
SEALER TISSUE G2 STRG ARTC 35C (ENDOMECHANICALS) ×2 IMPLANT
SET IRRIG TUBING LAPAROSCOPIC (IRRIGATION / IRRIGATOR) IMPLANT
SET TUBE SMOKE EVAC HIGH FLOW (TUBING) ×2 IMPLANT
SHEARS HARMONIC ACE PLUS 36CM (ENDOMECHANICALS) IMPLANT
SLEEVE ADV FIXATION 5X100MM (TROCAR) ×4 IMPLANT
SLEEVE ENDOPATH XCEL 5M (ENDOMECHANICALS) ×2 IMPLANT
STAPLER 90 3.5 STAND SLIM (STAPLE) ×2
STAPLER 90 3.5 STD SLIM (STAPLE) ×1 IMPLANT
STAPLER PROXIMATE 75MM BLUE (STAPLE) ×2 IMPLANT
STAPLER VISISTAT 35W (STAPLE) ×2 IMPLANT
SUT MNCRL AB 4-0 PS2 18 (SUTURE) ×2 IMPLANT
SUT PDS AB 1 CT1 27 (SUTURE) IMPLANT
SUT PROLENE 2 0 CT2 30 (SUTURE) IMPLANT
SUT PROLENE 2 0 KS (SUTURE) IMPLANT
SUT SILK 2 0 (SUTURE)
SUT SILK 2 0 SH CR/8 (SUTURE) IMPLANT
SUT SILK 2 0SH CR/8 30 (SUTURE) ×2 IMPLANT
SUT SILK 2-0 18XBRD TIE 12 (SUTURE) IMPLANT
SUT SILK 3 0 (SUTURE)
SUT SILK 3 0 SH CR/8 (SUTURE) IMPLANT
SUT SILK 3-0 18XBRD TIE 12 (SUTURE) IMPLANT
SYS LAPSCP GELPORT 120MM (MISCELLANEOUS)
SYSTEM LAPSCP GELPORT 120MM (MISCELLANEOUS) IMPLANT
TAPE CLOTH 4X10 WHT NS (GAUZE/BANDAGES/DRESSINGS) IMPLANT
TOWEL OR 17X26 10 PK STRL BLUE (TOWEL DISPOSABLE) ×2 IMPLANT
TRAY FOLEY MTR SLVR 14FR STAT (SET/KITS/TRAYS/PACK) ×2 IMPLANT
TRAY FOLEY MTR SLVR 16FR STAT (SET/KITS/TRAYS/PACK) IMPLANT
TROCAR ADV FIXATION 5X100MM (TROCAR) ×2 IMPLANT
TROCAR BALLN 12MMX100 BLUNT (TROCAR) ×2 IMPLANT
TROCAR XCEL NON-BLD 11X100MML (ENDOMECHANICALS) IMPLANT
TUBING CONNECTING 10 (TUBING) ×4 IMPLANT
YANKAUER SUCT BULB TIP NO VENT (SUCTIONS) ×2 IMPLANT

## 2020-06-13 NOTE — Anesthesia Procedure Notes (Signed)
Procedure Name: Intubation Date/Time: 06/13/2020 1:45 PM Performed by: Mitzie Na, CRNA Pre-anesthesia Checklist: Patient identified, Emergency Drugs available, Suction available and Patient being monitored Patient Re-evaluated:Patient Re-evaluated prior to induction Oxygen Delivery Method: Circle system utilized Preoxygenation: Pre-oxygenation with 100% oxygen Induction Type: IV induction Ventilation: Mask ventilation without difficulty Laryngoscope Size: Mac and 3 Grade View: Grade I Tube type: Oral Tube size: 7.5 mm Number of attempts: 1 Airway Equipment and Method: Stylet and Oral airway Placement Confirmation: positive ETCO2,  breath sounds checked- equal and bilateral and ETT inserted through vocal cords under direct vision Secured at: 24 cm Tube secured with: Tape Dental Injury: Teeth and Oropharynx as per pre-operative assessment

## 2020-06-13 NOTE — Op Note (Signed)
PATIENT: Andrew Hines  60 y.o. male  Patient Care Team: Andrew Cage, PA as PCP - General (Physician Assistant)  PREOP DIAGNOSIS: Ascending colon cancer  POSTOP DIAGNOSIS: Same  PROCEDURE: Laparoscopic-assisted right hemicolectomy 2. Lysis of adhesions x 90 minutes 3. Partial omentectomy 4. Bilateral transversus abdominus plane blocks  SURGEON: Andrew Mt. Ofilia Rayon, MD  ASSISTANT: Andrew Hurl PA-C  ANESTHESIA: General endotracheal  EBL: 75 mL Total I/O In: 1000 [I.V.:1000] Out: 75 [Blood:75]  DRAINS: None  SPECIMEN: 1. Right colon (including terminal ileum) 2. Omentum  COUNTS: Sponge, needle and instrument counts were reported correct x2  FINDINGS:  Relatively dense adhesions in the right lower quadrant between small bowel and small bowel as well as small bowel and mesentery likely related to his prior appendectomy in the 1980s.  Adhesiolysis was necessary to facilitate mobility of small bowel out of the pelvis and to identify the ileum.  Tattoo in the ascending colon.  No evident extension beyond the serosa of the colon or invasion and other structures.  Normal-appearing liver.  Normal-appearing omentum.  Omentum was quite adherent to the ascending colon as well as the ascending mesocolon.  Mass is palpable in the ascending colon.  Laparoscopic-assisted right hemicolectomy was carried out with a side-to-side ileo-transverse anastomosis.  NARRATIVE:  The patient was identified & brought into the operating room, placed supine on the operating table and SCDs were applied to the lower extremities. General endotracheal anesthesia was induced. The patient was positioned supine with left arm tucked.  Pressure points were evaluated and padded.  Antibiotics were administered. A foley catheter was placed under sterile conditions. Hair in the region of planned surgery was clipped. The abdomen was prepped and draped in a sterile fashion. A timeout was performed confirming our patient  and plan.   Beginning with the planned extraction port, a supraumbilical incision was made and carried down to the midline fascia. This was then incised with electrocautery. The peritoneum was identified and elevated between clamps and carefully opened sharply. A small Alexis wound protector with a cap and associated port was then placed. The abdomen was insufflated to 15 mmHg with Co2. A laparoscope was placed and camera inspection revealed no evidence of injury. Bilateral TAP blocks were then performed under laparoscopic visualization using a mixture of 0.25% marcaine with epinepherine + Exparel. 3 additional ports were then placed under direct laparoscopic visualization - two in the left hemiabdomen and one in the right abdomen. The abdomen was surveyed. The liver and peritoneum appeared normal.  There were no signs of metastatic disease.  The patient was positioned in trendelenburg with left side down.  There are relatively dense adhesions in the right lower quadrant from the small bowel to the small bowel as well as to the small bowel in the mesocolon.  There also omental adhesions down to the ascending colon.  Laparoscopic adhesiolysis was then carefully carried out with scissors.  Ultimately we were able to free up the distal ileum and identify the terminal ileum.  Given the amount of omentum that was relatively stuck over his ascending colon, a lateral to medial approach was employed.  The cecum was grasped and retracted medially.  The cecum and ascending colon were then mobilized by incising the Aleyna Cueva line of Toldt.  This was carried up to and partially around the hepatic flexure.  The terminal ileum was mobilized to facilitate region of the upper abdomen.  At this point, the cecum easily reached into the left upper quadrant without difficulty.  He was then repositioned in reverse Trendelenburg.  The omentum was elevated cephalad and the colon retracted caudad.  The lesser sac was gained.  This plane  was developed from the mid transverse colon out to the hepatic flexure.  The hepatic flexure was then carefully mobilized.  This ran relatively high up underneath his liver and therefore the gallbladder was carefully lifted anteriorly to facilitate mobilization.  The associated mesocolon was mobilized medially and the duodenum was identified and protected free of injury.  The mesocolon was mobilized off the anterior portion of the lateral wall of the duodenum.  The retroperitoneum was inspected and hemostasis appreciated.  At this point, the abdomen was desufflated and the terminal ileum and right colon delivered through the wound protector. The terminal ileum was then transected using a GIA blue load stapler.  A Babcock clamp is placed on the ileal staple line to help maintain orientation for our anastomosis.  There were some additional adhesions of the small bowel to the ileal mesentery that were carefully lysed sharply.  The mass was found in the ascending colon.  The middle colic pedicle was identified.  Including the right branch of the middle colic, the planned specimen includes both the ileocolic as well as right branch of middle colic pedicles.  This will allow adequate flow to the transverse colon for the anastomosis.  A window was created the mesentery and the colon at the level of the transverse was divided using a linear cutting GIA 75 mm blue load stapler.  Including the right branch of middle colic, the mesentery was ligated out to the ileocolic pedicle using the Enseal device.  The ileocolic pedicle is circumferentially dissected.  The duodenum was again confirmed to be well away from our plan dissection.  This is then sealed and divided using the Enseal device.  The mesenteric edges are inspected noted be hemostatic.  The specimen was then passed off. Attention was turned to creating the anastomosis. The distal ileum and transverse colon were inspected for orientation to ensure no twisting nor  bowel included in the mesenteric defect. An anastomosis was created between the terminal ileum and the transverse colon using a 75 mm GIA blue load stapler. This is on the antimesenteric side of the ileum and the tenia of the colon. The staple line was inspected and noted to be hemostatic.  The common enterotomy channel was closed using a TA 90 blue load stapler. Hemostasis was achieved at the staple line using 3-0 silk U-stitches. 3-0 silk sutures were used to imbricate the corners of the staple line as well.  A 2-0 silk suture was placed securing the "crotch" of the anastomosis. The anastomosis was palpated and noted to be widely patent. This was then placed back into the abdomen. The abdomen was then irrigated with sterile saline and hemostasis verified. The omentum was then brought down over the anastomosis. The laparoscopic ports were removed under direct visualization and the sites noted to be hemostatic. The Alexis wound protector was removed, counts were reported correct, and we switched to clean instruments, gowns and drapes.   The fascia was then closed using two running #1 PDS sutures tied centrally.  The skin of all incision sites was closed with a skin stapling device and covered with bandages. All counts were again reported correct. The patient was then awakened from anesthesia, extubated, and transferred to a stretcher for transport to PACU in satisfactory condition.  DISPOSITION: PACU in satisfactory condition

## 2020-06-13 NOTE — Anesthesia Postprocedure Evaluation (Signed)
Anesthesia Post Note  Patient: Andrew Hines  Procedure(s) Performed: LAPAROSCOPIC RIGHT HEMI COLECTOMY AND PARTIAL OMENTECTOMY WITH LYSIS OF ADHESIONS (N/A Abdomen)     Patient location during evaluation: PACU Anesthesia Type: General Level of consciousness: awake and alert Pain management: pain level controlled Vital Signs Assessment: post-procedure vital signs reviewed and stable Respiratory status: spontaneous breathing, nonlabored ventilation, respiratory function stable and patient connected to nasal cannula oxygen Cardiovascular status: blood pressure returned to baseline and stable Postop Assessment: no apparent nausea or vomiting Anesthetic complications: no   No complications documented.  Last Vitals:  Vitals:   06/13/20 1745 06/13/20 1805  BP: (!) 144/91 (!) 145/94  Pulse: 77 82  Resp: 10 16  Temp:  36.7 C  SpO2: 100% 99%    Last Pain:  Vitals:   06/13/20 1805  TempSrc: Oral  PainSc:                  Lekia Nier L Nayelis Bonito

## 2020-06-13 NOTE — H&P (Signed)
CC: Referred for newly diagnosed colon cancer - proximal ascending colon  HPI: Mr. Andrew Hines is a very pleasant 42yoM with hx of ED, HLD, who underwent his first colonoscopy at [facility] 04/17/20 with Dr. Alan Ripper. This was done for the purposes of blood in his stool when she first noticed approximately 1 month ago. He was found to have a mass in the proximal ascending colon that was one third the circumference of the lumen, nonobstructing, ulcerated, friable. Biopsies were obtained in this lesion was tattooed proximally and distally. A small polyp was removed from the descending colon.  Path: Pathology returned of the proximal ascending colon mass showing invasive moderately differentiated adenocarcinoma arising in a tubular adenoma with high-grade dysplasia. Descending colon polyp returned as a tubular adenoma.  He denies any complaints at this time. He reports that the blood in his stool he had noted has completely resolved. He denies any weight changes. He denies any nausea or vomiting. He denies any abdominal pain.  CT Abd/pelvis at [facility] 04/17/20 - done right after his colonoscopy showed large amount of gas;Marland Kitchen No obvious masses per se. Right renal cysts with probable hepatic cysts without definite evidence of metastatic disease. Right inguinal hernia. The lesion in the liver is 6 mm and low density. It did not enhance.  CT Chest 06/04/20 shows no evidence of metastatic disease in the chest.  INTERVAL HX He denies any changes in his health or health history since we met in the office. States he tolerated his bowel prep and is ready for surgery.   PMH: ED, HLD  PSH: Open appendectomy, 1983.  FHx: His father had metastatic prostate ca but ultimately died of lung cancer. He denies FHx of breast, endometrial, ovarian or cervical cancer  Social: Denies use of tobacco/drugs; 2-3 shots of gin per day. He works as a Librarian, academic at [employer]  ROS:  A comprehensive 10 system review of systems was completed with the patient and pertinent findings as noted above.  Past Medical History:  Diagnosis Date  . Cancer ([place])    colon cancer, basal cell   . Pneumonia    2020     Past Surgical History:  Procedure Laterality Date  . APPENDECTOMY      History reviewed. No pertinent family history.  Social:  reports that he has quit smoking. He has never used smokeless tobacco. He reports current alcohol use. He reports that he does not use drugs.  Allergies: No Known Allergies  Medications: I have reviewed the patient's current medications.  No results found for this or any previous visit (from the past 48 hour(s)).  No results found.  ROS - all of the below systems have been reviewed with the patient and positives are indicated with bold text General: chills, fever or night sweats Eyes: blurry vision or double vision ENT: epistaxis or sore throat Allergy/Immunology: itchy/watery eyes or nasal congestion Hematologic/Lymphatic: bleeding problems, blood clots or swollen lymph nodes Endocrine: temperature intolerance or unexpected weight changes Breast: new or changing breast lumps or nipple discharge Resp: cough, shortness of breath, or wheezing CV: chest pain or dyspnea on exertion GI: as per HPI GU: dysuria, trouble voiding, or hematuria MSK: joint pain or joint stiffness Neuro: TIA or stroke symptoms Derm: pruritus and skin lesion changes Psych: anxiety and depression  PE Blood pressure (!) 166/89, pulse 73, temperature 98.6 F (37 C), temperature source Oral, resp. rate 18, height 6' (1.829 m), weight 91.2 kg, SpO2 100 %. Constitutional:  NAD; conversant; wearing mask Eyes: Moist conjunctiva; no lid lag; anicteric Lungs: Normal respiratory effort CV: RRR; no pitting edema GI: Abd soft, NT/ND; no palpable hepatosplenomegaly MSK: Normal range of motion of extremities Psychiatric: Appropriate affect; alert and oriented  x3 Lymphatic: No palpable cervical or axillary lymphadenopathy  No results found for this or any previous visit (from the past 48 hour(s)).  No results found.   A/P: Andrew Hines is a very pleasant 59yoM with hx of HLD, ED here for evaluation of newly diagnosed adenocarcinoma in proximal ascending colon - tattoo'd prox + distally  CT A/P 04/17/20 - no evident metastatic dz; small 6 mm low denisity liver lesion which did not enhance - favored cyst  -The anatomy and physiology of the GI tract was reviewed with him today as well as the pathophysiology of colon cancers with associated pictures. -We discussed laparoscopic right hemicolectomy to address his malignancy -The planned procedure, material risks (including, but not limited to, pain, bleeding, infection, scarring, need for blood transfusion, damage to surrounding structures- blood vessels/nerves/viscus/organs, damage to ureter, leak from anastomosis, need for additional procedures, worsening of pre-existing medical conditions, need for stoma which may be permanent, hernia formation, recurrence of cancer despite surgery, DVT/PE, pneumonia, heart attack, stroke, death) benefits and alternatives to surgery were discussed at length. The patient's questions were answered to his satisfaction, he voiced understanding and elected to proceed with surgery. Additionally, we discussed typical postoperative expectations and the recovery process. -We also reviewed potential for additional treatment being recommended based on pathology results following surgery  Andrew Landau, MD [facility] Use [email or web address] to contact on call provider

## 2020-06-13 NOTE — Transfer of Care (Signed)
Immediate Anesthesia Transfer of Care Note  Patient: Andrew Hines  Procedure(s) Performed: Procedure(s): LAPAROSCOPIC RIGHT HEMI COLECTOMY AND PARTIAL OMENTECTOMY WITH LYSIS OF ADHESIONS (N/A)  Patient Location: PACU  Anesthesia Type:General  Level of Consciousness: Alert, Awake, Oriented  Airway & Oxygen Therapy: Patient Spontanous Breathing  Post-op Assessment: Report given to RN  Post vital signs: Reviewed and stable  Last Vitals:  Vitals:   06/13/20 1256  BP: (!) 166/89  Pulse: 73  Resp: 18  Temp: 37 C  SpO2: 262%    Complications: No apparent anesthesia complications

## 2020-06-13 NOTE — Anesthesia Preprocedure Evaluation (Addendum)
Anesthesia Evaluation  Patient identified by MRN, date of birth, ID band Patient awake    Reviewed: Allergy & Precautions, NPO status , Patient's Chart, lab work & pertinent test results  Airway Mallampati: II  TM Distance: >3 FB Neck ROM: Full    Dental no notable dental hx. (+) Teeth Intact, Dental Advisory Given   Pulmonary neg pulmonary ROS, former smoker,    Pulmonary exam normal breath sounds clear to auscultation       Cardiovascular Normal cardiovascular exam Rhythm:Regular Rate:Normal  HLD   Neuro/Psych negative neurological ROS  negative psych ROS   GI/Hepatic negative GI ROS, Neg liver ROS,   Endo/Other  negative endocrine ROS  Renal/GU negative Renal ROS  negative genitourinary   Musculoskeletal negative musculoskeletal ROS (+)   Abdominal   Peds  Hematology negative hematology ROS (+)   Anesthesia Other Findings Colon CA  Reproductive/Obstetrics                            Anesthesia Physical Anesthesia Plan  ASA: II  Anesthesia Plan: General   Post-op Pain Management:    Induction: Intravenous  PONV Risk Score and Plan: 2 and Midazolam, Dexamethasone and Ondansetron  Airway Management Planned: Oral ETT  Additional Equipment:   Intra-op Plan:   Post-operative Plan: Extubation in OR  Informed Consent: I have reviewed the patients History and Physical, chart, labs and discussed the procedure including the risks, benefits and alternatives for the proposed anesthesia with the patient or authorized representative who has indicated his/her understanding and acceptance.     Dental advisory given  Plan Discussed with: CRNA  Anesthesia Plan Comments:         Anesthesia Quick Evaluation

## 2020-06-14 ENCOUNTER — Encounter (HOSPITAL_COMMUNITY): Payer: Self-pay | Admitting: Surgery

## 2020-06-14 LAB — BASIC METABOLIC PANEL
Anion gap: 9 (ref 5–15)
BUN: 9 mg/dL (ref 8–23)
CO2: 22 mmol/L (ref 22–32)
Calcium: 8.6 mg/dL — ABNORMAL LOW (ref 8.9–10.3)
Chloride: 105 mmol/L (ref 98–111)
Creatinine, Ser: 1.16 mg/dL (ref 0.61–1.24)
GFR, Estimated: >60 mL/min (ref >60)
Glucose, Bld: 118 mg/dL — ABNORMAL HIGH (ref 70–99)
Potassium: 4.1 mmol/L (ref 3.5–5.1)
Sodium: 136 mmol/L (ref 135–145)

## 2020-06-14 LAB — CBC
HCT: 40.2 % (ref 39.0–52.0)
Hemoglobin: 12.6 g/dL — ABNORMAL LOW (ref 13.0–17.0)
MCH: 28 pg (ref 26.0–34.0)
MCHC: 31.3 g/dL (ref 30.0–36.0)
MCV: 89.3 fL (ref 80.0–100.0)
Platelets: 286 10*3/uL (ref 150–400)
RBC: 4.5 MIL/uL (ref 4.22–5.81)
RDW: 13.5 % (ref 11.5–15.5)
WBC: 9.9 10*3/uL (ref 4.0–10.5)
nRBC: 0 % (ref 0.0–0.2)

## 2020-06-14 NOTE — Progress Notes (Signed)
Subjective No acute events. Feeling reasonably well. Denies nausea/vomiting. Tolerating clears. Reports he had a BM this morning. Foley out this morning, voiding without difficulty.  Objective: Vital signs in last 24 hours: Temp:  [97.8 F (36.6 C)-98.7 F (37.1 C)] 97.9 F (36.6 C) (05/05 0519) Pulse Rate:  [69-95] 78 (05/05 0519) Resp:  [9-21] 15 (05/05 0519) BP: (133-169)/(88-107) 144/88 (05/05 0519) SpO2:  [93 %-100 %] 98 % (05/05 0519) Weight:  [91.2 kg] 91.2 kg (05/04 1247) Last BM Date: 06/14/20  Intake/Output from previous day: 05/04 0701 - 05/05 0700 In: 1451.2 [I.V.:1451.2] Out: 1550 [Urine:1475; Blood:75] Intake/Output this shift: No intake/output data recorded.  Gen: NAD, comfortable CV: RRR Pulm: Normal work of breathing Abd: Soft, appropriately ttp around incisions. Nondistended. Incisions c/d without erythema or drainage. Ext: SCDs in place  Lab Results: CBC  Recent Labs    06/14/20 0418  WBC 9.9  HGB 12.6*  HCT 40.2  PLT 286   BMET Recent Labs    06/14/20 0418  NA 136  K 4.1  CL 105  CO2 22  GLUCOSE 118*  BUN 9  CREATININE 1.16  CALCIUM 8.6*   PT/INR Recent Labs    06/13/20 1250  LABPROT 13.1  INR 1.0   ABG No results for input(s): PHART, HCO3 in the last 72 hours.  Invalid input(s): PCO2, PO2  Studies/Results:  Anti-infectives: Anti-infectives (From admission, onward)   Start     Dose/Rate Route Frequency Ordered Stop   06/13/20 1400  neomycin (MYCIFRADIN) tablet 1,000 mg  Status:  Discontinued       "And" Linked Group Details   1,000 mg Oral 3 times per day 06/13/20 1233 06/13/20 1243   06/13/20 1400  metroNIDAZOLE (FLAGYL) tablet 1,000 mg  Status:  Discontinued       "And" Linked Group Details   1,000 mg Oral 3 times per day 06/13/20 1233 06/13/20 1243   06/13/20 1245  cefoTEtan (CEFOTAN) 2 g in sodium chloride 0.9 % 100 mL IVPB        2 g 200 mL/hr over 30 Minutes Intravenous On call to O.R. 06/13/20 1233 06/13/20 1353        Assessment/Plan: Patient Active Problem List   Diagnosis Date Noted  . S/P right hemicolectomy 06/13/2020   s/p Procedure(s): LAPAROSCOPIC RIGHT HEMI COLECTOMY AND PARTIAL OMENTECTOMY WITH LYSIS OF ADHESIONS 06/13/2020  -We spent time reviewing his procedure, findings and answering his questions this morning -Full liquids, advance as tolerated -Ambulate 5x/day -D/C Entereg, D/C MIVF -PPx: SQH, SCDs   LOS: 1 day   Nadeen Landau, MD [facility] Use [email or web address] to contact on call provider

## 2020-06-15 LAB — BASIC METABOLIC PANEL
Anion gap: 9 (ref 5–15)
BUN: 10 mg/dL (ref 8–23)
CO2: 23 mmol/L (ref 22–32)
Calcium: 9.1 mg/dL (ref 8.9–10.3)
Chloride: 101 mmol/L (ref 98–111)
Creatinine, Ser: 1.09 mg/dL (ref 0.61–1.24)
GFR, Estimated: >60 mL/min (ref >60)
Glucose, Bld: 111 mg/dL — ABNORMAL HIGH (ref 70–99)
Potassium: 4 mmol/L (ref 3.5–5.1)
Sodium: 133 mmol/L — ABNORMAL LOW (ref 135–145)

## 2020-06-15 LAB — CBC
HCT: 43.1 % (ref 39.0–52.0)
Hemoglobin: 13.8 g/dL (ref 13.0–17.0)
MCH: 28.6 pg (ref 26.0–34.0)
MCHC: 32 g/dL (ref 30.0–36.0)
MCV: 89.2 fL (ref 80.0–100.0)
Platelets: 314 10*3/uL (ref 150–400)
RBC: 4.83 MIL/uL (ref 4.22–5.81)
RDW: 13.8 % (ref 11.5–15.5)
WBC: 10.1 10*3/uL (ref 4.0–10.5)
nRBC: 0 % (ref 0.0–0.2)

## 2020-06-15 MED ORDER — TRAMADOL HCL 50 MG PO TABS: 50.0000 mg | ORAL_TABLET | Freq: Four times a day (QID) | ORAL | 0 refills | Status: DC | PRN

## 2020-06-15 NOTE — Progress Notes (Signed)
Subjective Had 1 emesis at 4 am. Having multiple BMs now and passing flatus. Since has continued to tolerate liquids without difficulty or emesis. Objective: Vital signs in last 24 hours: Temp:  [98 F (36.7 C)-98.6 F (37 C)] 98.5 F (36.9 C) (05/06 0553) Pulse Rate:  [73-81] 77 (05/06 0553) Resp:  [15-18] 18 (05/06 0553) BP: (144-162)/(90-95) 154/93 (05/06 0553) SpO2:  [93 %-100 %] 93 % (05/06 0553) Weight:  [88.7 kg] 88.7 kg (05/06 0556) Last BM Date: 06/15/20  Intake/Output from previous day: 05/05 0701 - 05/06 0700 In: 0  Out: 375 [Urine:225; Emesis/NG output:150] Intake/Output this shift: Total I/O In: 120 [P.O.:120] Out: 600 [Stool:600]  Gen: NAD, comfortable CV: RRR Pulm: Normal work of breathing Abd: Soft, appropriately ttp around incisions. Not significantly distended. Incisions c/d without erythema or drainage; dressings in place Ext: SCDs in place  Lab Results: CBC  Recent Labs    06/14/20 0418 06/15/20 0342  WBC 9.9 10.1  HGB 12.6* 13.8  HCT 40.2 43.1  PLT 286 314   BMET Recent Labs    06/14/20 0418 06/15/20 0342  NA 136 133*  K 4.1 4.0  CL 105 101  CO2 22 23  GLUCOSE 118* 111*  BUN 9 10  CREATININE 1.16 1.09  CALCIUM 8.6* 9.1   PT/INR Recent Labs    06/13/20 1250  LABPROT 13.1  INR 1.0   ABG No results for input(s): PHART, HCO3 in the last 72 hours.  Invalid input(s): PCO2, PO2  Studies/Results:  Anti-infectives: Anti-infectives (From admission, onward)   Start     Dose/Rate Route Frequency Ordered Stop   06/13/20 1400  neomycin (MYCIFRADIN) tablet 1,000 mg  Status:  Discontinued       "And" Linked Group Details   1,000 mg Oral 3 times per day 06/13/20 1233 06/13/20 1243   06/13/20 1400  metroNIDAZOLE (FLAGYL) tablet 1,000 mg  Status:  Discontinued       "And" Linked Group Details   1,000 mg Oral 3 times per day 06/13/20 1233 06/13/20 1243   06/13/20 1245  cefoTEtan (CEFOTAN) 2 g in sodium chloride 0.9 % 100 mL IVPB        2  g 200 mL/hr over 30 Minutes Intravenous On call to O.R. 06/13/20 1233 06/13/20 1353       Assessment/Plan: Patient Active Problem List   Diagnosis Date Noted  . S/P right hemicolectomy 06/13/2020   s/p Procedure(s): LAPAROSCOPIC RIGHT HEMI COLECTOMY AND PARTIAL OMENTECTOMY WITH LYSIS OF ADHESIONS 06/13/2020  -He is on a soft diet; currently without n/v. Tolerating liquids this morning well. Discussed monitoring symptoms. He is not eating much but is drinking a fair amount. Voiding well. If develops more issues with n/v or isn't drinking well, restart MIVF -Ambulate 5x/day -PPx: SQH, SCDs -Dispo: Possible discharge tomorrow/Sunday based on how he's doing clinically   LOS: 2 days   Nadeen Landau, MD [facility] Use [email or web address] to contact on call provider

## 2020-06-15 NOTE — Discharge Instructions (Addendum)
POST OP INSTRUCTIONS AFTER COLON SURGERY  1. DIET: Be sure to include lots of fluids daily to stay hydrated - 64oz of water per day (8, 8 oz glasses).  Avoid fast food or heavy meals for the first couple of weeks as your are more likely to get nauseated. Avoid raw/uncooked fruits or vegetables for the first 4 weeks (its ok to have these if they are blended into smoothie form). If you have fruits/vegetables, make sure they are cooked until soft enough to mash on the roof of your mouth and chew your food well. Otherwise, diet as tolerated.  2. Take your usually prescribed home medications unless otherwise directed.  3. PAIN CONTROL: a. Pain is best controlled by a usual combination of three different methods TOGETHER: i. Ice/Heat ii. Over the counter pain medication iii. Prescription pain medication b. Most patients will experience some swelling and bruising around the surgical site.  Ice packs or heating pads (30-60 minutes up to 6 times a day) will help. Some people prefer to use ice alone, heat alone, alternating between ice & heat.  Experiment to what works for you.  Swelling and bruising can take several weeks to resolve.   c. It is helpful to take an over-the-counter pain medication regularly for the first few weeks: i. Ibuprofen (Motrin/Advil) - 200mg  tabs - take 3 tabs (600mg ) every 6 hours as needed for pain (unless you have been directed previously to avoid NSAIDs/ibuprofen) ii. Acetaminophen (Tylenol) - you may take 650mg  every 6 hours as needed. You can take this with motrin as they act differently on the body. If you are taking a narcotic pain medication that has acetaminophen in it, do not take over the counter tylenol at the same time. iii. NOTE: You may take both of these medications together - most patients  find it most helpful when alternating between the two (i.e. Ibuprofen at 6am, tylenol at 9am, ibuprofen at 12pm ...) d. A  prescription for pain medication should be given to you  upon discharge.  Take your pain medication as prescribed if your pain is not adequatly controlled with the over-the-counter pain reliefs mentioned above.  4. Avoid getting constipated.  Between the surgery and the pain medications, it is common to experience some constipation.  Increasing fluid intake and taking a fiber supplement (such as Metamucil, Citrucel, FiberCon, MiraLax, etc) 1-2 times a day regularly will usually help prevent this problem from occurring.  A mild laxative (prune juice, Milk of Magnesia, MiraLax, etc) should be taken according to package directions if there are no bowel movements after 48 hours.    5. Dressing: Your incisions have skin staples in place. We typically leave these in place for 10-14 days. You may bathe normally in a shower with soap/water over all incisions. Avoid submerging under water (like a bathtub, pool, lake, ocean) until your wounds are fully healed - typically around the time staples come out.   6. ACTIVITIES as tolerated:   a. Avoid heavy lifting (>10lbs or 1 gallon of milk) for the next 6 weeks. b. You may resume regular daily activities as tolerated--such as daily self-care, walking, climbing stairs--gradually increasing activities as tolerated.  If you can walk 30 minutes without difficulty, it is safe to try more intense activity such as jogging, treadmill, bicycling, low-impact aerobics.  c. DO NOT PUSH THROUGH PAIN.  Let pain be your guide: If it hurts to do something, don't do it. d. Dennis Bast may drive when you are no longer taking prescription  pain medication, you can comfortably wear a seatbelt, and you can safely maneuver your car and apply brakes.  7. FOLLOW UP in our office a. Please call [facility] at [phone] to set up an appointment to see your surgeon in the office for a follow-up appointment approximately 2 weeks after your surgery. b. Make sure that you call for this appointment the day you arrive home to insure a convenient appointment  time.  9. If you have disability or family leave forms that need to be completed, you may have them completed by your primary care physician's office; for return to work instructions, please ask our office staff and they will be happy to assist you in obtaining this documentation   When to call us [phone]: 1. Poor pain control 2. Reactions / problems with new medications (rash/itching, etc)  3. Fever over 101.5 F (38.5 C) 4. Inability to urinate 5. Nausea/vomiting 6. Worsening swelling or bruising 7. Continued bleeding from incision. 8. Increased pain, redness, or drainage from the incision  The clinic staff is available to answer your questions during regular business hours (8:30am-5pm).  Please don't hesitate to call and ask to speak to one of our nurses for clinical concerns.   A surgeon from [facility] is always on call at the hospitals   If you have a medical emergency, go to the nearest emergency room or call 911.  [facility], [place] [address] [place], [place], [place]  [address] MAIN: [phone] FAX: [phone] [email or web address]

## 2020-06-16 NOTE — Discharge Summary (Signed)
Physician Discharge Summary  Patient ID: Andrew Hines MRN: [id number] DOB/AGE: Jul 22, 1960 60 y.o.  Admit date: 06/13/2020 Discharge date: 06/16/2020  Admission Diagnoses: Colon cancer ascending colon  Discharge Diagnoses: Same Active Problems:   S/P right hemicolectomy   Discharged Condition: good  Hospital Course: Patient underwent laparoscopic right hemicolectomy 5//2022 with right.  He did well postoperatively.  His bowel function returned.  His diet was advanced and he was able to ambulate.  He did have some diarrhea which improved by postop day 3.  He had no issues with his wounds.  He was felt to be stable for discharge on postop day 3.     Treatments: surgery: Laparoscopic right hemicolectomy  Discharge Exam: Blood pressure 129/80, pulse 85, temperature 98.6 F (37 C), temperature source Oral, resp. rate 18, height 6' (1.829 m), weight 88.7 kg, SpO2 95 %. General appearance: alert and cooperative Resp: clear to auscultation bilaterally Cardio: RRR Incision/Wound: Dressings intact.  Incisions clean dry and intact with minimal serosanguineous drainage.  No rebound or guarding on the abdomen.  Disposition: Discharge disposition: 01-Home or Self Care       Discharge Instructions    Diet - low sodium heart healthy   Complete by: As directed    Increase activity slowly   Complete by: As directed      Allergies as of 06/16/2020   No Known Allergies     Medication List    TAKE these medications   diphenhydrAMINE HCl (Sleep) 50 MG Caps Take 100 mg by mouth at bedtime.   sildenafil 100 MG tablet Commonly known as: VIAGRA Take 100 mg by mouth daily as needed for erectile dysfunction.   traMADol 50 MG tablet Commonly known as: Ultram Take 1 tablet (50 mg total) by mouth every 6 (six) hours as needed for up to 5 days (postop pain not controlled with tylenol and ibuprofen first).   VISINE OP Place 1 drop into both eyes daily.   Vitamin D 50 MCG (2000 UT)  tablet Take 2,000 Units by mouth daily.       Follow-up Information    Ileana Roup, MD Follow up in 3 week(s).   Specialties: General Surgery, Colon and Rectal Surgery Why: Staple removal nurse visit week of 5/16 (our office will call to schedule this). If you do not hear back from our office early next week, please call to confirm this appointment. You will follow-up with me in 3-4 weeks as well, or sooner if necessary. Contact information: [place] [address] [phone]               Signed: Joyice Faster Rockie Vawter MD 06/16/2020, 9:57 AM

## 2020-06-16 NOTE — Progress Notes (Signed)
Dr Thermon Leyland aware frank blood in stool. See new orders entered by MD. Discharged cancelled.

## 2020-06-16 NOTE — Progress Notes (Signed)
3 Days Post-Op   Subjective/Chief Complaint: Patient feeling well.  No abdominal pain  Having loose stools overnight but these are getting more solid this morning he states.  No blood in the stool.  No abdominal pain.   Objective: Vital signs in last 24 hours: Temp:  [98 F (36.7 C)-98.6 F (37 C)] 98.6 F (37 C) (05/07 0535) Pulse Rate:  [85-97] 85 (05/07 0535) Resp:  [18] 18 (05/07 0535) BP: (124-144)/(77-90) 129/80 (05/07 0535) SpO2:  [95 %-100 %] 95 % (05/07 0535) Last BM Date: 06/15/20  Intake/Output from previous day: 05/06 0701 - 05/07 0700 In: 600 [P.O.:600] Out: 1600 [Urine:500; Stool:1100] Intake/Output this shift: Total I/O In: -  Out: 100 [Stool:100]  General appearance: alert and cooperative Resp: clear to auscultation bilaterally Cardio: regular rate and rhythm, S1, S2 normal, no murmur, click, rub or gallop Incision/Wound: Dressings are dry.  Abdomen soft nontender.  No rebound or guarding  Lab Results:  Recent Labs    06/14/20 0418 06/15/20 0342  WBC 9.9 10.1  HGB 12.6* 13.8  HCT 40.2 43.1  PLT 286 314   BMET Recent Labs    06/14/20 0418 06/15/20 0342  NA 136 133*  K 4.1 4.0  CL 105 101  CO2 22 23  GLUCOSE 118* 111*  BUN 9 10  CREATININE 1.16 1.09  CALCIUM 8.6* 9.1   PT/INR Recent Labs    06/13/20 1250  LABPROT 13.1  INR 1.0   ABG No results for input(s): PHART, HCO3 in the last 72 hours.  Invalid input(s): PCO2, PO2  Studies/Results: No results found.  Anti-infectives: Anti-infectives (From admission, onward)   Start     Dose/Rate Route Frequency Ordered Stop   06/13/20 1400  neomycin (MYCIFRADIN) tablet 1,000 mg  Status:  Discontinued       "And" Linked Group Details   1,000 mg Oral 3 times per day 06/13/20 1233 06/13/20 1243   06/13/20 1400  metroNIDAZOLE (FLAGYL) tablet 1,000 mg  Status:  Discontinued       "And" Linked Group Details   1,000 mg Oral 3 times per day 06/13/20 1233 06/13/20 1243   06/13/20 1245   cefoTEtan (CEFOTAN) 2 g in sodium chloride 0.9 % 100 mL IVPB        2 g 200 mL/hr over 30 Minutes Intravenous On call to O.R. 06/13/20 1233 06/13/20 1353      Assessment/Plan: s/p Procedure(s): LAPAROSCOPIC RIGHT HEMI COLECTOMY AND PARTIAL OMENTECTOMY WITH LYSIS OF ADHESIONS (N/A) Patient with diarrhea but much better this morning and stools becoming more solid he states.  He is ready for discharge  I gave instructions for him to remove his dressings tomorrow and shower  No lifting  He may be driving a week  I told to call if his diarrhea worsens or he has blood in his stool.  Follow-up already arranged with Dr. Dema Severin  LOS: 3 days    Joyice Faster Makale Pindell 06/16/2020

## 2020-06-17 LAB — CBC
HCT: 38.5 % — ABNORMAL LOW (ref 39.0–52.0)
Hemoglobin: 12.3 g/dL — ABNORMAL LOW (ref 13.0–17.0)
MCH: 28.4 pg (ref 26.0–34.0)
MCHC: 31.9 g/dL (ref 30.0–36.0)
MCV: 88.9 fL (ref 80.0–100.0)
Platelets: 330 10*3/uL (ref 150–400)
RBC: 4.33 MIL/uL (ref 4.22–5.81)
RDW: 13.8 % (ref 11.5–15.5)
WBC: 5.8 10*3/uL (ref 4.0–10.5)
nRBC: 0 % (ref 0.0–0.2)

## 2020-06-17 MED ORDER — TRAMADOL HCL 50 MG PO TABS: 50.0000 mg | ORAL_TABLET | Freq: Four times a day (QID) | ORAL | 0 refills | Status: AC | PRN

## 2020-06-17 NOTE — Discharge Summary (Signed)
Addendum   Pt has a bloody BM prior to discharge yesterday  That stopped over night   More than likely staple line and not unexpected HGB 12.3   No more bleeding   Feels well Discharge home

## 2020-06-17 NOTE — Progress Notes (Signed)
4 Days Post-Op   Subjective/Chief Complaint: Patient feeling well.  No abdominal pain  Blood in his stool yesterday.  Last BM was 6PM.  No nausea/vomiting.  Pain control adequate.   Objective: Vital signs in last 24 hours: Temp:  [97.9 F (36.6 C)-98.2 F (36.8 C)] 98.1 F (36.7 C) (05/08 0557) Pulse Rate:  [80-88] 80 (05/08 0557) Resp:  [15-18] 15 (05/08 0557) BP: (116-127)/(73-87) 127/87 (05/08 0557) SpO2:  [97 %-98 %] 97 % (05/08 0557) Last BM Date: 06/15/20  Intake/Output from previous day: 05/07 0701 - 05/08 0700 In: 600 [P.O.:600] Out: 450 [Stool:450] Intake/Output this shift: No intake/output data recorded.  Incision/Wound: Dressings are dry.  Abdomen soft nontender.  No rebound or guarding  Lab Results:  Recent Labs    06/15/20 0342  WBC 10.1  HGB 13.8  HCT 43.1  PLT 314   BMET Recent Labs    06/15/20 0342  NA 133*  K 4.0  CL 101  CO2 23  GLUCOSE 111*  BUN 10  CREATININE 1.09  CALCIUM 9.1   PT/INR No results for input(s): LABPROT, INR in the last 72 hours. ABG No results for input(s): PHART, HCO3 in the last 72 hours.  Invalid input(s): PCO2, PO2  Studies/Results: No results found.  Anti-infectives: Anti-infectives (From admission, onward)   Start     Dose/Rate Route Frequency Ordered Stop   06/13/20 1400  neomycin (MYCIFRADIN) tablet 1,000 mg  Status:  Discontinued       "And" Linked Group Details   1,000 mg Oral 3 times per day 06/13/20 1233 06/13/20 1243   06/13/20 1400  metroNIDAZOLE (FLAGYL) tablet 1,000 mg  Status:  Discontinued       "And" Linked Group Details   1,000 mg Oral 3 times per day 06/13/20 1233 06/13/20 1243   06/13/20 1245  cefoTEtan (CEFOTAN) 2 g in sodium chloride 0.9 % 100 mL IVPB        2 g 200 mL/hr over 30 Minutes Intravenous On call to O.R. 06/13/20 1233 06/13/20 1353      Assessment/Plan: s/p Procedure(s): LAPAROSCOPIC RIGHT HEMI COLECTOMY AND PARTIAL OMENTECTOMY WITH LYSIS OF ADHESIONS (N/A)  Follow  up AM labwork.  If hgb stable, plan for discharge today.  Blood in BM likely within normal limits from staple line oozing.  Fewer bowel movements and more solid are signs of bleeding stopping.   LOS: 4 days    Andrew Hines 06/17/2020

## 2020-06-17 NOTE — Progress Notes (Signed)
AVS discussed with patient. All new medications and follow up appointments reviewed with patient. IV access removed. Dressing applied. All pt belongings with patient in belongings bag and given to daughter. All questions answered/ Daughter will transport pt home via private vehicle.

## 2020-06-19 LAB — SURGICAL PATHOLOGY

## 2020-06-19 NOTE — Progress Notes (Signed)
Patient returned my call.  I explained Dr. Dema Severin with [facility] referred him to medical oncology.  I have set him up to be seen by Cira Rue NP & Dr. Truitt Merle on Wednesday 07/04/2020 at 11:00 to arrive no later than 10:45.  He is aware he may bring one person with him to the consult and that [place] parking is available for his use.  He agreed to this date/time.

## 2020-06-19 NOTE — Progress Notes (Signed)
Left voice message for patient to return my call on my direct number regarding referral we received from Dr. Dema Severin to medical oncology.

## 2020-06-27 ENCOUNTER — Other Ambulatory Visit: Payer: Self-pay

## 2020-06-27 NOTE — Progress Notes (Signed)
The proposed treatment discussed in conference is for discussion purposes only and is not a binding recommendation.  The patients have not been physically examined, or presented with their treatment options.  Therefore, final treatment plans cannot be decided.   

## 2020-07-02 ENCOUNTER — Telehealth: Payer: Self-pay

## 2020-07-02 NOTE — Telephone Encounter (Signed)
Andrew Hines at [facility] has faxed request for CD of images done at [facility] be sent to [place] via [employer] for loading into our system.

## 2020-07-03 DIAGNOSIS — C182 Malignant neoplasm of ascending colon: Secondary | ICD-10-CM | POA: Insufficient documentation

## 2020-07-03 NOTE — Progress Notes (Addendum)
[place]  [phone] Fax:([phone] [place] Note   Patient Care Team: Milford Cage, PA as PCP - General (Physician Assistant) Alla Feeling, NP as Nurse Practitioner (Oncology) Jonnie Finner, RN as Oncology Nurse Navigator Truitt Merle, MD as Consulting Physician (Hematology and Oncology) Ileana Roup, MD as Consulting Physician (General Surgery) Koleen Distance, MD as Referring Physician (Gastroenterology) 07/04/2020  CHIEF COMPLAINTS/PURPOSE OF CONSULTATION:  Colon cancer, referred by [facility] surgeon Dr. Nadeen Landau  Cancer Staging Malignant neoplasm of ascending colon [facility]) Staging form: Colon and Rectum, AJCC 8th Edition - Pathologic stage from 06/13/2020: Stage IIIB (pT3, pN1b, cM0) - Signed by Alla Feeling, NP on 07/03/2020 Stage prefix: Initial diagnosis Histologic grading system: 4 grade system Histologic grade (G): G2 Lymph-vascular invasion (LVI): LVI present/identified, NOS Perineural invasion (PNI): Absent  Oncology History  Malignant neoplasm of ascending colon ([place])  04/17/2020 Procedure   Colonoscopy at [facility], by Dr. Twana First Shahid A mass was found in the proximal ascending colon, 1/3 circumference of the lumen, nonobstructing, ulcerated and friable multiple biopsies were performed.  A single polyp measuring 4 mm was found in the descending colon, a polypectomy was performed.  The resection was complete.  Medium size internal hemorrhoids were found.   04/17/2020 Initial Biopsy   Path : Proximal ascending colon: Invasive moderately differentiated adenocarcinoma arising in a tubular adenoma with high-grade dysplasia  Descending colon polyp: tubular adenoma   04/17/2020 Imaging   Staging CT A/P 04/17/20 at [facility]: Impression The lung bases are clear.  Focal 6 mm low-density lesion in the right lobe of the liver which does not enhance and likely represents a small cyst.  No altered  areas of attenuation are identified to suggest hepatic mass.  The spleen, pancreas, adrenal glands have a normal appearance.  There are cortical cysts in both kidneys.  Both kidneys function normally.  Large amount of gas in the colon and this post colonoscopy patient.  No obvious colonic masses.  No pneumoperitoneum.  No free fluid or abdominal fluid collections.  No lytic or blastic lesions.  Right inguinal hernia filled with fat.  Impression: Renal cyst with probable hepatic cyst without definite evidence of metastatic disease.  Right inguinal hernia.   06/04/2020 Imaging   Staging CT chest IMPRESSION: 1. No evidence of metastatic disease. 2. Hepatic steatosis. 3. Aortic atherosclerosis (ICD10-I70.0). Coronary artery calcification. 4. Enlarged pulmonary arteries, indicative of pulmonary arterial hypertension.     06/13/2020 Cancer Staging   Staging form: Colon and Rectum, AJCC 8th Edition - Pathologic stage from 06/13/2020: Stage IIIB (pT3, pN1b, cM0) - Signed by Alla Feeling, NP on 07/03/2020 Stage prefix: Initial diagnosis Histologic grading system: 4 grade system Histologic grade (G): G2 Lymph-vascular invasion (LVI): LVI present/identified, NOS Perineural invasion (PNI): Absent   06/13/2020 Surgery   PROCEDURE: Laparoscopic-assisted right hemicolectomy 2. Lysis of adhesions x 90 minutes 3. Partial omentectomy 4. Bilateral transversus abdominus plane blocks SURGEON: [name], MD   06/13/2020 Pathology Results   FINAL MICROSCOPIC DIAGNOSIS:  A. COLON, RIGHT AND TERMINAL ILEUM, RESECTION:  - Invasive adenocarcinoma, moderately differentiated, spanning 3.8 cm.  - Tumor invades through muscularis propria into pericolonic tissue.  - Lymphovascular invasion present.  - Resection margins are negative.  - Metastatic carcinoma in two of nineteen lymph nodes (2/19).  - See oncology table.   B. OMENTUM, RESECTION:  - Benign fibroadipose tissue.   pT3pN1b   IHC  EXPRESSION RESULTS  TEST  RESULT  MLH1:          Preserved nuclear expression  MSH2:          Preserved nuclear expression  MSH6:          Preserved nuclear expression  PMS2:          Preserved nuclear expression    06/13/2020 Initial Diagnosis   Malignant neoplasm of ascending colon ([facility])    HISTORY OF PRESENTING ILLNESS:  Andrew Hines 60 y.o. male is here because of newly diagnosed colon cancer.  He presented for first colonoscopy on 04/17/2020 with Dr. Alan Ripper [facility]) after 1 month history of blood in his stool.  He was found to have a nonobstructing, ulcerated and friable mass in the proximal ascending colon, one third the circumference of the lumen.  Biopsies were obtained.  A small polyp was removed from the descending colon which was tubular adenoma.  Path of the ascending colon mass showed invasive moderately differentiated adenocarcinoma arising from a tubular adenoma with high-grade dysplasia.  CT AP at [facility] 04/17/2020 showed bilateral cortical cysts in kidneys and a non-enhancing 6 mm low-density right liver lesion, felt to represent a cyst, otherwise no evidence of metastatic disease. CT chest 06/04/2020 to complete staging showed enlarged pulmonary arteries indicative of PAH, aortic atherosclerosis, hepatic steatosis, but no metastatic disease.  Baseline CEA was not done.  He underwent lap assisted right hemicolectomy, lysis of adhesions, and partial omenectomy on 06/13/2020 by Dr. Dema Severin.  Path of the right colon and terminal ileum showed invasive adenocarcinoma, moderately differentiated spanning 3.8 cm invading through muscularis propria into pericolonic tissue.  Lymphovascular invasion is present, resection margins are negative.  No perforation or perineural invasion were identified.  There is metastatic carcinoma found in 2 of 19 lymph nodes.  Omental path showed benign fibroadipose tissue.  This was staged pT3pN1b, stage IIIB  There is preserved expression of the  major mismatch repair proteins.  He had an uneventful postoperative course except 1 bloody BM and was discharged home 06/16/2020.   He has no significant past medical history.  He lives alone, has a significant other, siblings, and strong social support.  He is independent with ADLs and drives himself, walks 2 miles daily.  Works as a Health and safety inspector at [employer].  He is a former smoker x5 years, quit in 1993.  Drinks a gin and tonic daily for the past 25 years.  Denies other drug use.  3 adult healthy children including 2 sons and 1 daughter.  Family history is significant for a father with lung cancer and 6 paternal uncles with either prostate or colon cancer, a paternal cousin with lung cancer, and his ex-wife (his daughter's mother) had breast cancer.  His daughter had genetic testing and is reportedly negative.  He has discussed cancer risk with his children and they are aware.  Andrew Hines presents by himself.  He is recovering well from surgery, overdid it recently trying to clean his home.  He is 60% energy back to baseline.  Bowels moving daily, no recent bleeding.  He has a little discomfort in the abdomen with seatbelts and prolonged sitting.  Denies nausea/vomiting, fever, chills, cough, chest pain, dyspnea, signs of thrombosis, or other concerns.   MEDICAL HISTORY:  Past Medical History:  Diagnosis Date  . Cancer ([place])    colon cancer, basal cell   . Pneumonia    2020     SURGICAL HISTORY: Past Surgical History:  Procedure Laterality Date  .  APPENDECTOMY    . LAPAROSCOPIC RIGHT HEMI COLECTOMY N/A 06/13/2020   Procedure: LAPAROSCOPIC RIGHT HEMI COLECTOMY AND PARTIAL OMENTECTOMY WITH LYSIS OF ADHESIONS;  Surgeon: Ileana Roup, MD;  Location: [facility];  Service: General;  Laterality: N/A;    SOCIAL HISTORY: Social History   Socioeconomic History  . Marital status: Significant Other    Spouse name: Not on file  . Number of children: 3  . Years of education: Not on file   . Highest education level: Not on file  Occupational History  . Not on file  Tobacco Use  . Smoking status: Former Smoker    Packs/day: 1.00    Years: 5.00    Pack years: 5.00    Quit date: 1993    Years since quitting: 29.4  . Smokeless tobacco: Never Used  Vaping Use  . Vaping Use: Never used  Substance and Sexual Activity  . Alcohol use: Yes    Comment: gin daily x25 years  . Drug use: Never  . Sexual activity: Yes  Other Topics Concern  . Not on file  Social History Narrative   Lives alone, has significant other and sibling support. 3 adult children (1 son has Crohn's). Daughter's mother had breast cancer (pt's daughter reportedly negative genetic testing). 6 paternal uncles had either prostate or colon cancers, details kept private in the family. 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     Social Determinants of Health   Financial Resource Strain: Not on file  Food Insecurity: Not on file  Transportation Needs: Not on file  Physical Activity: Not on file  Stress: Not on file  Social Connections: Not on file  Intimate Partner Violence: Not At Risk  . Fear of Current or Ex-Partner: No  . Emotionally Abused: No  . Physically Abused: No  . Sexually Abused: No    FAMILY HISTORY: Family History  Problem Relation Age of Onset  . Cancer Father        lung  . Cancer Paternal Uncle   . Cancer Cousin        paternal, lung  . Cancer Paternal Uncle   . Cancer Paternal Uncle   . Cancer Paternal Uncle   . Cancer Paternal Uncle   . Cancer Paternal Uncle     ALLERGIES:  has No Known Allergies.  MEDICATIONS:  Current Outpatient Medications  Medication Sig Dispense Refill  . Cholecalciferol (VITAMIN D) 50 MCG (2000 UT) tablet Take 2,000 Units by mouth daily.    . diphenhydrAMINE HCl, Sleep, 50 MG CAPS Take 100 mg by mouth at bedtime.    . sildenafil (VIAGRA) 100 MG tablet Take 100 mg by mouth daily as needed for erectile  dysfunction.    . Tetrahydrozoline HCl (VISINE OP) Place 1 drop into both eyes daily.     No current facility-administered medications for this visit.    REVIEW OF SYSTEMS:   Constitutional: Denies fevers, chills or abnormal night sweats Eyes: Denies blurriness of vision, double vision or watery eyes Ears, nose, mouth, throat, and face: Denies mucositis or sore throat Respiratory: Denies cough, dyspnea or wheezes Cardiovascular: Denies palpitation, chest discomfort or lower extremity swelling Gastrointestinal:  Denies nausea, heartburn or change in bowel habits (+) 1 mo bloody stool before diagnosis  Skin: Denies abnormal skin rashes Lymphatics: Denies new lymphadenopathy or easy bruising Neurological:Denies numbness, tingling or new weaknesses Behavioral/Psych: Mood is stable, no new changes  All other systems were reviewed with the patient  and are negative.  PHYSICAL EXAMINATION: ECOG PERFORMANCE STATUS: 1 - Symptomatic but completely ambulatory  Vitals:   07/04/20 1107  BP: (!) 155/91  Pulse: 84  Resp: 19  Temp: (!) 96.3 F (35.7 C)  SpO2: 100%   Filed Weights   07/04/20 1107  Weight: 195 lb 14.4 oz (88.9 kg)    GENERAL:alert, no distress and comfortable SKIN: No rash EYES:  sclera clear NECK: Without mass LYMPH:  no palpable cervical or supraclavicular lymphadenopathy  LUNGS: clear with normal breathing effort HEART: regular rate & rhythm, no lower extremity edema ABDOMEN:abdomen soft, non-tender and normal bowel sounds.  Midline and laparoscopic incisions closed, healing well   PSYCH: alert & oriented x 3 with fluent speech NEURO: no focal motor/sensory deficits  LABORATORY DATA:  I have reviewed the data as listed CBC Latest Ref Rng & Units 06/17/2020 06/15/2020 06/14/2020  WBC 4.0 - 10.5 K/uL 5.8 10.1 9.9  Hemoglobin 13.0 - 17.0 g/dL 12.3(L) 13.8 12.6(L)  Hematocrit 39.0 - 52.0 % 38.5(L) 43.1 40.2  Platelets 150 - 400 K/uL 330 314 286   CMP Latest Ref Rng &  Units 06/15/2020 06/14/2020 06/06/2020  Glucose 70 - 99 mg/dL 111(H) 118(H) 92  BUN 8 - 23 mg/dL _0 Creatinine 0.61 - 1.24 mg/dL 1.09 1.16 1.00  Sodium 135 - 145 mmol/L 133(L) 136 142  Potassium 3.5 - 5.1 mmol/L 4.0 4.1 4.3  Chloride 98 - 111 mmol/L 101 105 110  CO2 22 - 32 mmol/L _1 Calcium 8.9 - 10.3 mg/dL 9.1 8.6(L) 9.3  Total Protein 6.5 - 8.1 g/dL - - 7.7  Total Bilirubin 0.3 - 1.2 mg/dL - - 0.9  Alkaline Phos 38 - 126 U/L - - 77  AST 15 - 41 U/L - - 32  ALT 0 - 44 U/L - - 12    RADIOGRAPHIC STUDIES: I have personally reviewed the radiological images as listed and agreed with the findings in the report. CT CHEST W CONTRAST  Result Date: 06/04/2020 CLINICAL DATA:  Colon cancer staging. EXAM: CT CHEST WITH CONTRAST TECHNIQUE: Multidetector CT imaging of the chest was performed during intravenous contrast administration. CONTRAST:  66m ISOVUE-300 IOPAMIDOL (ISOVUE-300) INJECTION 61% Creatinine was obtained on site at [facility] [address] Results: Creatinine 1.2 mg/dL. COMPARISON:  None. FINDINGS: Cardiovascular: Atherosclerotic calcification of the aorta, aortic valve and coronary arteries. Right and left pulmonary arteries are enlarged. Heart size normal. No pericardial effusion. Mediastinum/Nodes: No pathologically enlarged mediastinal, hilar or axillary lymph nodes. Esophagus is grossly unremarkable. Lungs/Pleura: Minimal patchy peribronchovascular ground-glass in the lingula, likely infectious or inflammatory in etiology. No pulmonary nodules. No pleural fluid. Airway is unremarkable. Upper Abdomen: Liver is decreased in attenuation diffusely. Subcentimeter low-attenuation lesion in the right hepatic lobe is too small to characterize. Visualized portions of the liver, gallbladder and adrenal glands are otherwise unremarkable. 1.2 cm low-attenuation lesion in the right kidney is too small to characterize. Visualized portions of the kidneys, spleen, pancreas,  stomach and bowel are otherwise unremarkable. Musculoskeletal: Minimal degenerative change in the spine. IMPRESSION: 1. No evidence of metastatic disease. 2. Hepatic steatosis. 3. Aortic atherosclerosis (ICD10-I70.0). Coronary artery calcification. 4. Enlarged pulmonary arteries, indicative of pulmonary arterial hypertension. Electronically Signed   By: MLorin PicketM.D.   On: 06/04/2020 15:16    ASSESSMENT & PLAN: 60year old male with no significant PMH  1.  Invasive adenocarcinoma of the ascending colon, grade 2, pT3pN1bM0 stage IIIB, MMR normal -We reviewed his  medical record in detail with the patient.  He presented with 1 month history of bloody stools, found to have invasive adenocarcinoma in the ascending colon on first colonoscopy -Staging work-up showed renal and liver cysts, otherwise negative for distant metastatic disease. Pre-op CEA not done. -We reviewed his surgical path in detail which shows grade 2 adenocarcinoma invading through the muscularis propria, LVI+, metastatic carcinoma in 2 of 19 lymph nodes.  Margins were clear. There was no perineural invasion or perforation.  -MMR is normal, not likely MSI high but will request testing. -We discussed his colon cancer was resected but given the lymph node involvement carries a moderate to high risk of recurrence. -We discussed the standard of care is adjuvant chemo for stage III colon cancer, to reduce the risk of recurrence; we discussed 3 months Capeox with oxaliplatin on day 1 and oral xeloda BID on days 1-14 q21 days versus 3-6 months FOLFOX q14 days. He is young and otherwise, he would likely tolerate chemotherapy well. Dr. Burr Medico recommends 3 months adjuvant capeox. -Chemotherapy consent: Side effects including but not limited to fatigue, decreased appetite, mucositis, n/v, constipation (oxali), diarrhea (xeloda), hair thinning, neuropathy/cold sensitivity (oxali), hand/foot syndrome (xeloda), fluid retention, renal and kidney  dysfunction, neutropenic fever, need for blood transfusion, and bleeding, were discussed with patient in great detail. He agrees to proceed. We discussed if he does not tolerate capoex we can switch to FOLFOX. -We will proceed without a port a cath. Will arrange chemo education session with support person in 1-2 weeks -We discussed the risk of cancer recurrence in the future and the surveillance plan, which is a physical exam and lab test (including CBC, CMP and CEA) every 3 months for the first 2-3 years, then every 6-12 months, colonoscopy in one year, and surveilliance CT scan every year for 3 years. He is agreeable. -Andrew Hines is recovering well from surgery with mild abdominal soreness and fatigue. Incisions closed and healing well. He plans to dog/house sit for his daughter in 2 weeks, plan to start chemo after that.  -in the meantime, he understands to focus on nutrition/hydration, activity, and recovery.  -lab and follow up 6/13 with first cycle capeox. He knows not to start xeloda until we see him.  2.  Genetics -Due to his personal colon cancer and family history of colon and prostate cancers, he qualifies for genetics. He is interested and agreeable, I referred him. -his daughter's mother had breast cancer, daughter is reportedly negative but unclear if just BRCA or full genetics panel.  -patient's 3 children understand their risks and are agreeable to cancer screening. Pt also has siblings who will need screening and possible genetic testing.  3.  Health promotion/disease prevention -He smoked cigarettes for 5 years, quit in 1991/04/06 after his father died of lung cancer. He knows to abstain from smoking -He has had gin/tonic drink daily x25 years. I recommend to stop alcohol during chemo, and cut back significantly to reduce risk of colon cancer recurrence and developing new cancers in the future. He is agreeable -he is UTD on covid-19 vaccines  PLAN: -Work up including colonoscopy,  imaging, and surgical path reviewed -Recommend adjuvant CAPEOX with oxaliplatin on day 1 and oral xeloda BID days 1-14 q21 days x 3 months for stage IIIB colon cancer  -Proceed without PAC  -Reviewed subsequent surveillance plan  -Chemo class in 1-2 weeks -Referral to genetics  -Request MSI testing -Lab and follow up with first cycle 6/13   Orders Placed  This Encounter  Procedures  . CEA (IN HOUSE-[facility])FOR [facility] ONLY    Standing Status:   Standing    Number of Occurrences:   20    Standing Expiration Date:   07/04/2021  . CBC with Differential (Cancer Center Only)    Standing Status:   Standing    Number of Occurrences:   50    Standing Expiration Date:   07/04/2021  . CMP ([place] only)    Standing Status:   Standing    Number of Occurrences:   50    Standing Expiration Date:   07/04/2021  . Ferritin    Standing Status:   Standing    Number of Occurrences:   20    Standing Expiration Date:   07/04/2021  . Iron and TIBC    Standing Status:   Standing    Number of Occurrences:   20    Standing Expiration Date:   07/04/2021  . Ambulatory referral to Genetics    Referral Priority:   Routine    Referral Type:   Consultation    Referral Reason:   Specialty Services Required    Number of Visits Requested:   1    All questions were answered. The patient knows to call the clinic with any problems, questions or concerns.     Alla Feeling, NP 07/04/2020   Addendum  I have seen the patient, examined him. I agree with the assessment and and plan and have edited the notes.   Andrew Hines is a 60 yo male without significant past medical history, presented with rectal bleeding.  Work-up reviewed sending colon cancer.  Status post right hemicolectomy.  I reviewed his surgical pathology in detail, which showed grade 2 T3 lesion, with 2 positive lymph nodes, and positive lymphovascular invasion.  Surgical margins were negative.  I reviewed the natural history of colon cancer, and  high risk of recurrence in stage III disease.  I recommend adjuvant chemotherapy.  We discussed the option of Capox and FOLFOX.  I recommend [place] ox for 3 months, based on the IDEA data.  Benefit and side effects reviewed with him in detail, he agrees to proceed.  He is recovering well from surgery, plan to start in 2 weeks.  We also discussed colon cancer surveillance.  All questions were answered.  Truitt Merle  07/04/2020

## 2020-07-04 ENCOUNTER — Other Ambulatory Visit: Payer: Self-pay

## 2020-07-04 ENCOUNTER — Encounter: Payer: Self-pay | Admitting: Nurse Practitioner

## 2020-07-04 ENCOUNTER — Other Ambulatory Visit: Payer: Self-pay | Admitting: Hematology

## 2020-07-04 ENCOUNTER — Inpatient Hospital Stay
Admission: RE | Admit: 2020-07-04 | Discharge: 2020-07-04 | Disposition: A | Discharge status: Home / Self Care | Payer: Self-pay | Source: Ambulatory Visit | Attending: Hematology | Admitting: Hematology

## 2020-07-04 ENCOUNTER — Inpatient Hospital Stay: Payer: No Typology Code available for payment source | Attending: Nurse Practitioner | Admitting: Nurse Practitioner

## 2020-07-04 ENCOUNTER — Other Ambulatory Visit (HOSPITAL_COMMUNITY): Payer: Self-pay | Admitting: Hematology

## 2020-07-04 DIAGNOSIS — Z79899 Other long term (current) drug therapy: Secondary | ICD-10-CM | POA: Insufficient documentation

## 2020-07-04 DIAGNOSIS — I10 Essential (primary) hypertension: Secondary | ICD-10-CM | POA: Diagnosis not present

## 2020-07-04 DIAGNOSIS — K760 Fatty (change of) liver, not elsewhere classified: Secondary | ICD-10-CM | POA: Diagnosis not present

## 2020-07-04 DIAGNOSIS — Z87891 Personal history of nicotine dependence: Secondary | ICD-10-CM | POA: Diagnosis not present

## 2020-07-04 DIAGNOSIS — K4090 Unilateral inguinal hernia, without obstruction or gangrene, not specified as recurrent: Secondary | ICD-10-CM | POA: Diagnosis not present

## 2020-07-04 DIAGNOSIS — C801 Malignant (primary) neoplasm, unspecified: Secondary | ICD-10-CM

## 2020-07-04 DIAGNOSIS — I700 Atherosclerosis of aorta: Secondary | ICD-10-CM | POA: Diagnosis not present

## 2020-07-04 DIAGNOSIS — Z801 Family history of malignant neoplasm of trachea, bronchus and lung: Secondary | ICD-10-CM | POA: Insufficient documentation

## 2020-07-04 DIAGNOSIS — C182 Malignant neoplasm of ascending colon: Secondary | ICD-10-CM

## 2020-07-04 DIAGNOSIS — K7689 Other specified diseases of liver: Secondary | ICD-10-CM | POA: Diagnosis not present

## 2020-07-04 MED ORDER — CAPECITABINE 500 MG PO TABS
ORAL_TABLET | ORAL | 0 refills | Status: DC
  Filled 2020-07-04: qty 98, fill #0

## 2020-07-04 MED ORDER — PROCHLORPERAZINE MALEATE 10 MG PO TABS: 10.0000 mg | ORAL_TABLET | Freq: Four times a day (QID) | ORAL | 1 refills | Status: DC | PRN

## 2020-07-04 MED ORDER — ONDANSETRON HCL 8 MG PO TABS: 8.0000 mg | ORAL_TABLET | Freq: Two times a day (BID) | ORAL | 1 refills | Status: DC | PRN

## 2020-07-04 NOTE — Progress Notes (Signed)
Met with patient who presented alone to his initial medical oncology consult with Cira Rue NP and Dr. Truitt Merle.  I had previously spoken to him on the phone.  I explained my role as nurse navigator and he was given my direct contact information. No barriers to treatment were identified at this time.

## 2020-07-04 NOTE — Progress Notes (Signed)
START ON PATHWAY REGIMEN - Colorectal     A cycle is every 21 days:     Capecitabine      Oxaliplatin   **Always confirm dose/schedule in your pharmacy ordering system**  Patient Characteristics: Postoperative without Neoadjuvant Therapy (Pathologic Staging), Colon, Stage III, Low Risk (pT1-3, pN1) Tumor Location: Colon Therapeutic Status: Postoperative without Neoadjuvant Therapy (Pathologic Staging) AJCC M Category: cM0 AJCC T Category: pT3 AJCC N Category: pN1b AJCC 8 Stage Grouping: IIIB Intent of Therapy: Curative Intent, Discussed with Patient

## 2020-07-04 NOTE — Patient Instructions (Signed)
Oxaliplatin Injection What is this medicine? OXALIPLATIN (ox AL i PLA tin) is a chemotherapy drug. It targets fast dividing cells, like cancer cells, and causes these cells to die. This medicine is used to treat cancers of the colon and rectum, and many other cancers. This medicine may be used for other purposes; ask your health care provider or pharmacist if you have questions. COMMON BRAND NAME(S): Eloxatin What should I tell my health care provider before I take this medicine? They need to know if you have any of these conditions:  heart disease  history of irregular heartbeat  liver disease  low blood counts, like white cells, platelets, or red blood cells  lung or breathing disease, like asthma  take medicines that treat or prevent blood clots  tingling of the fingers or toes, or other nerve disorder  an unusual or allergic reaction to oxaliplatin, other chemotherapy, other medicines, foods, dyes, or preservatives  pregnant or trying to get pregnant  breast-feeding How should I use this medicine? This drug is given as an infusion into a vein. It is administered in a hospital or clinic by a specially trained health care professional. Talk to your pediatrician regarding the use of this medicine in children. Special care may be needed. Overdosage: If you think you have taken too much of this medicine contact a poison control center or emergency room at once. NOTE: This medicine is only for you. Do not share this medicine with others. What if I miss a dose? It is important not to miss a dose. Call your doctor or health care professional if you are unable to keep an appointment. What may interact with this medicine? Do not take this medicine with any of the following medications:  cisapride  dronedarone  pimozide  thioridazine This medicine may also interact with the following medications:  aspirin and aspirin-like medicines  certain medicines that treat or prevent  blood clots like warfarin, apixaban, dabigatran, and rivaroxaban  cisplatin  cyclosporine  diuretics  medicines for infection like acyclovir, adefovir, amphotericin B, bacitracin, cidofovir, foscarnet, ganciclovir, gentamicin, pentamidine, vancomycin  NSAIDs, medicines for pain and inflammation, like ibuprofen or naproxen  other medicines that prolong the QT interval (an abnormal heart rhythm)  pamidronate  zoledronic acid This list may not describe all possible interactions. Give your health care provider a list of all the medicines, herbs, non-prescription drugs, or dietary supplements you use. Also tell them if you smoke, drink alcohol, or use illegal drugs. Some items may interact with your medicine. What should I watch for while using this medicine? Your condition will be monitored carefully while you are receiving this medicine. You may need blood work done while you are taking this medicine. This medicine may make you feel generally unwell. This is not uncommon as chemotherapy can affect healthy cells as well as cancer cells. Report any side effects. Continue your course of treatment even though you feel ill unless your healthcare professional tells you to stop. This medicine can make you more sensitive to cold. Do not drink cold drinks or use ice. Cover exposed skin before coming in contact with cold temperatures or cold objects. When out in cold weather wear warm clothing and cover your mouth and nose to warm the air that goes into your lungs. Tell your doctor if you get sensitive to the cold. Do not become pregnant while taking this medicine or for 9 months after stopping it. Women should inform their health care professional if they wish to become   pregnant or think they might be pregnant. Men should not father a child while taking this medicine and for 6 months after stopping it. There is potential for serious side effects to an unborn child. Talk to your health care professional  for more information. Do not breast-feed a child while taking this medicine or for 3 months after stopping it. This medicine has caused ovarian failure in some women. This medicine may make it more difficult to get pregnant. Talk to your health care professional if you are concerned about your fertility. This medicine has caused decreased sperm counts in some men. This may make it more difficult to father a child. Talk to your health care professional if you are concerned about your fertility. This medicine may increase your risk of getting an infection. Call your health care professional for advice if you get a fever, chills, or sore throat, or other symptoms of a cold or flu. Do not treat yourself. Try to avoid being around people who are sick. Avoid taking medicines that contain aspirin, acetaminophen, ibuprofen, naproxen, or ketoprofen unless instructed by your health care professional. These medicines may hide a fever. Be careful brushing or flossing your teeth or using a toothpick because you may get an infection or bleed more easily. If you have any dental work done, tell your dentist you are receiving this medicine. What side effects may I notice from receiving this medicine? Side effects that you should report to your doctor or health care professional as soon as possible:  allergic reactions like skin rash, itching or hives, swelling of the face, lips, or tongue  breathing problems  cough  low blood counts - this medicine may decrease the number of white blood cells, red blood cells, and platelets. You may be at increased risk for infections and bleeding  nausea, vomiting  pain, redness, or irritation at site where injected  pain, tingling, numbness in the hands or feet  signs and symptoms of bleeding such as bloody or black, tarry stools; red or dark brown urine; spitting up blood or brown material that looks like coffee grounds; red spots on the skin; unusual bruising or bleeding  from the eyes, gums, or nose  signs and symptoms of a dangerous change in heartbeat or heart rhythm like chest pain; dizziness; fast, irregular heartbeat; palpitations; feeling faint or lightheaded; falls  signs and symptoms of infection like fever; chills; cough; sore throat; pain or trouble passing urine  signs and symptoms of liver injury like dark yellow or brown urine; general ill feeling or flu-like symptoms; light-colored stools; loss of appetite; nausea; right upper belly pain; unusually weak or tired; yellowing of the eyes or skin  signs and symptoms of low red blood cells or anemia such as unusually weak or tired; feeling faint or lightheaded; falls  signs and symptoms of muscle injury like dark urine; trouble passing urine or change in the amount of urine; unusually weak or tired; muscle pain; back pain Side effects that usually do not require medical attention (report to your doctor or health care professional if they continue or are bothersome):  changes in taste  diarrhea  gas  hair loss  loss of appetite  mouth sores This list may not describe all possible side effects. Call your doctor for medical advice about side effects. You may report side effects to FDA at 1-800-FDA-1088. Where should I keep my medicine? This drug is given in a hospital or clinic and will not be stored at home. NOTE:   This sheet is a summary. It may not cover all possible information. If you have questions about this medicine, talk to your doctor, pharmacist, or health care provider.  2021 Elsevier/Gold Standard (2018-06-16 12:20:35)   Capecitabine tablets What is this medicine? CAPECITABINE (ka pe SITE a been) is a chemotherapy drug. It treats certain types of cancer like breast cancer, colon cancer, and rectal cancer. This medicine may be used for other purposes; ask your health care provider or pharmacist if you have questions. COMMON BRAND NAME(S): Xeloda What should I tell my health care  provider before I take this medicine? They need to know if you have any of these conditions:  dehydration  have been told that you lack the enzyme DPD (dihydropyrimidine dehydrogenase)  heart disease  kidney disease  liver disease  low blood counts (white cells or platelets)  take medicines that treat or prevent blood clots  an unusual or allergic reaction to capecitabine, fluorouracil, other medicines, foods, dyes, or preservatives  pregnant or trying to get pregnant  breast-feeding How should I use this medicine? Take this medicine by mouth with water. Take it as directed on the prescription label. Do not cut, crush or chew this medicine. Swallow the tablets whole. Take it with food within 30 minutes after finishing a meal. Keep taking it unless your health care provider tells you to stop. This medicine is taken in "cycles". There will be days you do not take it. Talk to your health care provider if you have questions about when to take your medicine. It is very important to follow the exact schedule. Taking it more often than directed can cause serious side effects. Handling this medicine may be harmful. Wear gloves while touching the medicine or bottle. Talk to your health care provider about how to handle this medicine. Special instructions may apply. Talk to your health care provider about the use of this medicine in children. Special care may be needed. Patients over 24 years of age may have a stronger reaction. Overdosage: If you think you have taken too much of this medicine contact a poison control center or emergency room at once. NOTE: This medicine is only for you. Do not share this medicine with others. What if I miss a dose? If you miss a dose, skip it. Take your next dose at the normal time. Do not take extra or 2 doses at the same time to make up for the missed dose. What may interact with this medicine? This medicine may interact with the following  medications:  allopurinol  leucovorin  phenytoin  warfarin This list may not describe all possible interactions. Give your health care provider a list of all the medicines, herbs, non-prescription drugs, or dietary supplements you use. Also tell them if you smoke, drink alcohol, or use illegal drugs. Some items may interact with your medicine. What should I watch for while using this medicine? This medicine may make you feel generally unwell. This is not uncommon as chemotherapy can affect healthy cells as well as cancer cells. Report any side effects. Continue your course of treatment even though you feel ill unless your health care provider tells you to stop. You may need blood work while you are taking this medicine. This medicine may increase your risk of getting an infection. Call your health care provider for advice if you get a fever, chills, sore throat, or other symptoms of a cold or flu. Do not treat yourself. Try to avoid being around people who are  sick. This medicine may increase your risk to bruise or bleed. Call your doctor or health care professional if you notice any unusual bleeding. Be careful brushing and flossing your teeth or using a toothpick because you may get an infection or bleed more easily. If you have any dental work done, tell your dentist you are receiving this medicine. Avoid taking medicines that contain aspirin, acetaminophen, ibuprofen, naproxen, or ketoprofen unless instructed by your health care provider. These medicines may hide a fever. Do not become pregnant while taking this medicine or for 6 months after stopping it. Women should inform their health care provider if they wish to become pregnant or think they might be pregnant. Men should not father a child while taking this medicine and for 3 months after stopping it. There is potential for serious harm to an unborn child. Talk to your health care provider for more information. Do not breast-feed an infant  while taking this medicine or for 2 weeks after stopping it. This medicine may make it more difficult to get pregnant or father a child. Talk to your health care provider if you are concerned about your fertility. Check with your health care provider if you have severe diarrhea, nausea, and vomiting, or if you sweat a lot. The loss of too much body fluid may make it dangerous for you to take this medicine. This medicine may cause serious skin reactions. They can happen weeks to months after starting the medicine. Contact your health care provider right away if you notice fevers or flu-like symptoms with a rash. The rash may be red or purple and then turn into blisters or peeling of the skin. Or, you might notice a red rash with swelling of the face, lips or lymph nodes in your neck or under your arms. What side effects may I notice from receiving this medicine? Side effects that you should report to your doctor or health care professional as soon as possible:  allergic reactions (skin rash, itching or hives; swelling of the face, lips, or tongue)  diarrhea  dizziness  infection (fever, chills, cough, sore throat, pain or trouble passing urine)  low red blood cell counts (trouble breathing; feeling faint; lightheaded, falls; unusually weak or tired)  kidney injury (trouble passing urine or change in the amount of urine)  light-colored stool  liver injury (dark yellow or brown urine; general ill feeling or flu-like symptoms; loss of appetite, right upper belly pain; unusually weak or tired, yellowing of the eyes or skin)  mouth sores  nausea or vomiting  pain or tightness in the chest, neck, back, or arms  redness, blistering, peeling, bleeding, or swelling of the skin on the palms of your hands or soles of your feet  redness, blistering, peeling, or loosening of the skin, including inside the mouth  unusual bruising or bleeding Side effects that usually do not require medical  attention (report to your doctor or health care professional if they continue or are bothersome):  changes in vision  constipation  loss of appetite  mouth sores  pain, tingling, numbness in the hands or feet  stomach pain This list may not describe all possible side effects. Call your doctor for medical advice about side effects. You may report side effects to FDA at 1-800-FDA-1088. Where should I keep my medicine? Keep out of the reach of children and pets. Store at room temperature between 20 and 25 degrees C (68 and 77 degrees F). Keep the container tightly closed. Get rid  of any unused medicine after the expiration date. To get rid of medicines that are no longer needed or have expired:  Take the medicine to a medicine take-back program. Check with your pharmacy or law enforcement to find a location.  If you cannot return the medicine, ask your pharmacist or health care provider how to get rid of this medicine safely. NOTE: This sheet is a summary. It may not cover all possible information. If you have questions about this medicine, talk to your doctor, pharmacist, or health care provider.  2021 Elsevier/Gold Standard (2019-08-29 10:43:03)

## 2020-07-05 ENCOUNTER — Other Ambulatory Visit (HOSPITAL_COMMUNITY): Payer: Self-pay

## 2020-07-05 ENCOUNTER — Telehealth: Payer: Self-pay | Admitting: Nurse Practitioner

## 2020-07-05 ENCOUNTER — Telehealth: Payer: Self-pay

## 2020-07-05 ENCOUNTER — Encounter: Payer: Self-pay | Admitting: Hematology

## 2020-07-05 ENCOUNTER — Telehealth: Payer: Self-pay | Admitting: Pharmacist

## 2020-07-05 DIAGNOSIS — C182 Malignant neoplasm of ascending colon: Secondary | ICD-10-CM

## 2020-07-05 NOTE — Telephone Encounter (Signed)
Oral Oncology Pharmacist Encounter  Received new prescription for Xeloda (capecitabine) for the adjuvant treatment of stage IIIB colon cancer in conjunction with oxaliplatin, planned duration 3 months.  Prescription dose and frequency assessed for appropriateness. Appropriate for therapy initiation.   BMP from 06/15/20 and CBC from 06/17/20 assessed, no dose adjustments required at this time.  Current medication list in Epic reviewed, no relevant/significant DDIs with Xeloda identified.  Evaluated chart and no patient barriers to medication adherence noted.   Patient agreement for treatment documented in MD note on 07/04/20.  Prescription has been e-scribed to the [facility] for benefits analysis and approval.  Oral Oncology Clinic will continue to follow for insurance authorization, copayment issues, initial counseling and start date.  Leron Croak, PharmD, BCPS Hematology/Oncology Clinical Pharmacist [facility] [phone] 07/05/2020 11:00 AM

## 2020-07-05 NOTE — Telephone Encounter (Signed)
Oral Oncology Patient Advocate Encounter  After completing a benefits investigation, prior authorization for Xeloda is not required at this time through [employer].  Patient's copay is $93.04(has not met deductible).    [place] [facility] [place] Phone [phone] Fax [phone] 07/05/2020 9:08 AM

## 2020-07-05 NOTE — Telephone Encounter (Signed)
Scheduled appt per 5/25 sch msg. Called pt, no answer. Left msg with appt date and time.

## 2020-07-11 ENCOUNTER — Encounter: Payer: Self-pay | Admitting: Hematology

## 2020-07-11 ENCOUNTER — Other Ambulatory Visit: Payer: Self-pay

## 2020-07-11 ENCOUNTER — Inpatient Hospital Stay: Payer: No Typology Code available for payment source | Attending: Hematology

## 2020-07-11 DIAGNOSIS — I2510 Atherosclerotic heart disease of native coronary artery without angina pectoris: Secondary | ICD-10-CM | POA: Insufficient documentation

## 2020-07-11 DIAGNOSIS — F17210 Nicotine dependence, cigarettes, uncomplicated: Secondary | ICD-10-CM | POA: Insufficient documentation

## 2020-07-11 DIAGNOSIS — K7689 Other specified diseases of liver: Secondary | ICD-10-CM | POA: Insufficient documentation

## 2020-07-11 DIAGNOSIS — I700 Atherosclerosis of aorta: Secondary | ICD-10-CM | POA: Insufficient documentation

## 2020-07-11 DIAGNOSIS — K760 Fatty (change of) liver, not elsewhere classified: Secondary | ICD-10-CM | POA: Insufficient documentation

## 2020-07-11 DIAGNOSIS — C182 Malignant neoplasm of ascending colon: Secondary | ICD-10-CM | POA: Insufficient documentation

## 2020-07-11 DIAGNOSIS — Z5111 Encounter for antineoplastic chemotherapy: Secondary | ICD-10-CM | POA: Insufficient documentation

## 2020-07-11 DIAGNOSIS — K4090 Unilateral inguinal hernia, without obstruction or gangrene, not specified as recurrent: Secondary | ICD-10-CM | POA: Insufficient documentation

## 2020-07-11 DIAGNOSIS — N281 Cyst of kidney, acquired: Secondary | ICD-10-CM | POA: Insufficient documentation

## 2020-07-16 ENCOUNTER — Other Ambulatory Visit (HOSPITAL_COMMUNITY): Payer: Self-pay

## 2020-07-16 MED ORDER — CAPECITABINE 500 MG PO TABS
ORAL_TABLET | ORAL | 0 refills | Status: DC
  Filled 2020-07-16: qty 98, fill #0
  Filled 2020-07-20: qty 98, 21d supply, fill #0

## 2020-07-16 NOTE — Progress Notes (Signed)
Pharmacist Chemotherapy Monitoring - Initial Assessment    Anticipated start date: 07/23/20   Regimen:  . Are orders appropriate based on the patient's diagnosis, regimen, and cycle? Yes . Does the plan date match the patient's scheduled date? Yes . Is the sequencing of drugs appropriate? Yes . Are the premedications appropriate for the patient's regimen? Yes . Prior Authorization for treatment is: Pending o If applicable, is the correct biosimilar selected based on the patient's insurance? not applicable  Organ Function and Labs: Marland Kitchen Are dose adjustments needed based on the patient's renal function, hepatic function, or hematologic function? No . Are appropriate labs ordered prior to the start of patient's treatment? Yes . Other organ system assessment, if indicated: N/A . The following baseline labs, if indicated, have been ordered: N/A  Dose Assessment: . Are the drug doses appropriate? Yes . Are the following correct: o Drug concentrations Yes o IV fluid compatible with drug Yes o Administration routes Yes o Timing of therapy Yes . If applicable, does the patient have documented access for treatment and/or plans for port-a-cath placement? no . If applicable, have lifetime cumulative doses been properly documented and assessed? not applicable Lifetime Dose Tracking  No doses have been documented on this patient for the following tracked chemicals: Doxorubicin, Epirubicin, Idarubicin, Daunorubicin, Mitoxantrone, Bleomycin, Oxaliplatin, Carboplatin, Liposomal Doxorubicin  o   Toxicity Monitoring/Prevention: . The patient has the following take home antiemetics prescribed: Ondansetron and Prochlorperazine . The patient has the following take home medications prescribed: N/A . Medication allergies and previous infusion related reactions, if applicable, have been reviewed and addressed. Yes . The patient's current medication list has been assessed for drug-drug interactions with their  chemotherapy regimen. no significant drug-drug interactions were identified on review.  Order Review: . Are the treatment plan orders signed? No . Is the patient scheduled to see a provider prior to their treatment? Yes  I verify that I have reviewed each item in the above checklist and answered each question accordingly.  Andrew Hines, [place], 07/16/2020  2:35 PM

## 2020-07-16 NOTE — Telephone Encounter (Signed)
Oral Chemotherapy Pharmacist Encounter   Attempted to reach patient to provide update and offer for initial counseling on oral medication: Xeloda (capecitabine).   No answer. Left voicemail for patient to call back to discuss details of medication acquisition and initial counseling session.  Leron Croak, PharmD, BCPS Hematology/Oncology Clinical Pharmacist [facility] [phone] 07/16/2020 1:15 PM

## 2020-07-17 ENCOUNTER — Other Ambulatory Visit (HOSPITAL_COMMUNITY): Payer: Self-pay

## 2020-07-17 ENCOUNTER — Encounter: Payer: Self-pay | Admitting: Hematology

## 2020-07-17 NOTE — Telephone Encounter (Signed)
Oral Chemotherapy Pharmacist Encounter   Attempted to reach patient to provide update and offer for initial counseling on oral medication: Xeloda (capecitabine).   No answer. Left voicemail for patient to call back to discuss details of medication acquisition and initial counseling session.  Leron Croak, PharmD, BCPS Hematology/Oncology Clinical Pharmacist [facility] [phone] 07/17/2020 12:45 PM

## 2020-07-17 NOTE — Progress Notes (Signed)
Called pt to introduce myself as his Arboriculturist and to discuss the [employer].  Unfortunately there aren't any foundations offering copay assistance for his Dx.  I left a msg requesting he return my call if he's interested in applying for the grant.

## 2020-07-18 ENCOUNTER — Other Ambulatory Visit (HOSPITAL_COMMUNITY): Payer: Self-pay

## 2020-07-18 ENCOUNTER — Inpatient Hospital Stay: Payer: No Typology Code available for payment source | Admitting: Genetic Counselor

## 2020-07-18 ENCOUNTER — Inpatient Hospital Stay: Payer: No Typology Code available for payment source

## 2020-07-18 NOTE — Progress Notes (Signed)
[place]   Telephone:(336) [phone] Fax:(336) [phone]   Clinic Follow up Note   Patient Care Team: Milford Cage, PA as PCP - General (Physician Assistant) Alla Feeling, NP as Nurse Practitioner (Oncology) Jonnie Finner, RN as Oncology Nurse Navigator Truitt Merle, MD as Consulting Physician (Hematology and Oncology) Ileana Roup, MD as Consulting Physician (General Surgery) Koleen Distance, MD as Referring Physician (Gastroenterology)  Date of Service:  07/23/2020  CHIEF COMPLAINT: F/u of colon cancer   SUMMARY OF ONCOLOGIC HISTORY: Oncology History Overview Note  Cancer Staging Malignant neoplasm of ascending colon [facility]) Staging form: Colon and Rectum, AJCC 8th Edition - Pathologic stage from 06/13/2020: Stage IIIB (pT3, pN1b, cM0) - Signed by Alla Feeling, NP on 07/03/2020 Stage prefix: Initial diagnosis Histologic grading system: 4 grade system Histologic grade (G): G2 Lymph-vascular invasion (LVI): LVI present/identified, NOS Perineural invasion (PNI): Absent     Malignant neoplasm of ascending colon ([place])  04/17/2020 Procedure   Colonoscopy at [facility], by Dr. Twana First Shahid A mass was found in the proximal ascending colon, 1/3 circumference of the lumen, nonobstructing, ulcerated and friable multiple biopsies were performed.  A single polyp measuring 4 mm was found in the descending colon, a polypectomy was performed.  The resection was complete.  Medium size internal hemorrhoids were found.   04/17/2020 Initial Biopsy   Path : Proximal ascending colon: Invasive moderately differentiated adenocarcinoma arising in a tubular adenoma with high-grade dysplasia  Descending colon polyp: tubular adenoma   04/17/2020 Imaging   Staging CT A/P 04/17/20 at [facility]: Impression The lung bases are clear.  Focal 6 mm low-density lesion in the right lobe of the liver which does not enhance and likely represents a small cyst.  No altered  areas of attenuation are identified to suggest hepatic mass.  The spleen, pancreas, adrenal glands have a normal appearance.  There are cortical cysts in both kidneys.  Both kidneys function normally.  Large amount of gas in the colon and this post colonoscopy patient.  No obvious colonic masses.  No pneumoperitoneum.  No free fluid or abdominal fluid collections.  No lytic or blastic lesions.  Right inguinal hernia filled with fat.  Impression: Renal cyst with probable hepatic cyst without definite evidence of metastatic disease.  Right inguinal hernia.   06/04/2020 Imaging   Staging CT chest IMPRESSION: 1. No evidence of metastatic disease. 2. Hepatic steatosis. 3. Aortic atherosclerosis (ICD10-I70.0). Coronary artery calcification. 4. Enlarged pulmonary arteries, indicative of pulmonary arterial hypertension.     06/13/2020 Cancer Staging   Staging form: Colon and Rectum, AJCC 8th Edition - Pathologic stage from 06/13/2020: Stage IIIB (pT3, pN1b, cM0) - Signed by Alla Feeling, NP on 07/03/2020  Stage prefix: Initial diagnosis  Histologic grading system: 4 grade system  Histologic grade (G): G2  Lymph-vascular invasion (LVI): LVI present/identified, NOS  Perineural invasion (PNI): Absent    06/13/2020 Surgery   PROCEDURE: Laparoscopic-assisted right hemicolectomy 2. Lysis of adhesions x 90 minutes 3. Partial omentectomy 4. Bilateral transversus abdominus plane blocks SURGEON: [name], MD   06/13/2020 Pathology Results   FINAL MICROSCOPIC DIAGNOSIS:  A. COLON, RIGHT AND TERMINAL ILEUM, RESECTION:  - Invasive adenocarcinoma, moderately differentiated, spanning 3.8 cm.  - Tumor invades through muscularis propria into pericolonic tissue.  - Lymphovascular invasion present.  - Resection margins are negative.  - Metastatic carcinoma in two of nineteen lymph nodes (2/19).  - See oncology table.   B. OMENTUM, RESECTION:  -  Benign fibroadipose tissue.   pT3pN1b    IHC EXPRESSION RESULTS  TEST           RESULT  MLH1:          Preserved nuclear expression  MSH2:          Preserved nuclear expression  MSH6:          Preserved nuclear expression  PMS2:          Preserved nuclear expression    06/13/2020 Initial Diagnosis   Malignant neoplasm of ascending colon ([place])    07/23/2020 -  Chemotherapy   CAPOX q3weeks with Xeloda 1551m in the AM and 20049min the PM starting 07/23/20.        CURRENT THERAPY:  CAPOX q3weeks with Xeloda 150041mn the AM and 2000m61m the PM starting 07/23/20  INTERVAL HISTORY:  Andrew PARSONhere for a follow up. She presents to the clinic alone. He notes he is ready to proceed with chemo. He has recovered 90% from surgery. He was able to gain few pounds. He notes his daughter is a pharSoftware engineer will be stay with her his first few days after chemo.    REVIEW OF SYSTEMS:   Constitutional: Denies fevers, chills or abnormal weight loss Eyes: Denies blurriness of vision Ears, nose, mouth, throat, and face: Denies mucositis or sore throat Respiratory: Denies cough, dyspnea or wheezes Cardiovascular: Denies palpitation, chest discomfort or lower extremity swelling Gastrointestinal:  Denies nausea, heartburn or change in bowel habits Skin: Denies abnormal skin rashes Lymphatics: Denies new lymphadenopathy or easy bruising Neurological:Denies numbness, tingling or new weaknesses Behavioral/Psych: Mood is stable, no new changes  All other systems were reviewed with the patient and are negative.  MEDICAL HISTORY:  Past Medical History:  Diagnosis Date   Cancer (HCC)[place] colon cancer, basal cell    Pneumonia    2020     SURGICAL HISTORY: Past Surgical History:  Procedure Laterality Date   APPENDECTOMY     LAPAROSCOPIC RIGHT HEMI COLECTOMY N/A 06/13/2020   Procedure: LAPAROSCOPIC RIGHT HEMI COLECTOMY AND PARTIAL OMENTECTOMY WITH LYSIS OF ADHESIONS;  Surgeon: [name];  Location: [facility];  Service:  General;  Laterality: N/A;    I have reviewed the social history and family history with the patient and they are unchanged from previous note.  ALLERGIES:  has No Known Allergies.  MEDICATIONS:  Current Outpatient Medications  Medication Sig Dispense Refill   capecitabine (XELODA) 500 MG tablet Take 3 tabs in morning and 4 tabs in evening for 14 days then off 7 days. Repeat every 21 days. Take within 30 minutes after meals. 98 tablet 0   Cholecalciferol (VITAMIN D) 50 MCG (2000 UT) tablet Take 2,000 Units by mouth daily.     diphenhydrAMINE HCl, Sleep, 50 MG CAPS Take 100 mg by mouth at bedtime.     ondansetron (ZOFRAN) 8 MG tablet Take 1 tablet (8 mg total) by mouth 2 (two) times daily as needed for refractory nausea / vomiting. Start on day 3 after chemotherapy. 30 tablet 1   prochlorperazine (COMPAZINE) 10 MG tablet Take 1 tablet (10 mg total) by mouth every 6 (six) hours as needed (Nausea or vomiting). 30 tablet 1   sildenafil (VIAGRA) 100 MG tablet Take 100 mg by mouth daily as needed for erectile dysfunction.     Tetrahydrozoline HCl (VISINE OP) Place 1 drop into both eyes daily.     No current facility-administered medications for  this visit.   Facility-Administered Medications Ordered in Other Visits  Medication Dose Route Frequency Provider Last Rate Last Admin   heparin lock flush 100 unit/mL  500 Units Intracatheter Once PRN Truitt Merle, MD       oxaliplatin (ELOXATIN) 275 mg in dextrose 5 % 500 mL chemo infusion  130 mg/m2 (Treatment Plan Recorded) Intravenous Once Truitt Merle, MD 278 mL/hr at 07/23/20 1517 275 mg at 07/23/20 1517   sodium chloride flush (NS) 0.9 % injection 10 mL  10 mL Intracatheter PRN Truitt Merle, MD        PHYSICAL EXAMINATION: ECOG PERFORMANCE STATUS: 0 - Asymptomatic  Vitals:   07/23/20 1211  BP: (!) 148/87  Pulse: 83  Resp: 19  Temp: (!) 97.4 F (36.3 C)  SpO2: 99%   Filed Weights   07/23/20 1211  Weight: 199 lb 12.8 oz (90.6 kg)   Due to  COVID19 we will limit examination to appearance. Patient had no complaints.  GENERAL:alert, no distress and comfortable SKIN: skin color normal, no rashes or significant lesions EYES: normal, Conjunctiva are pink and non-injected, sclera clear  NEURO: alert & oriented x 3 with fluent speech   LABORATORY DATA:  I have reviewed the data as listed CBC Latest Ref Rng & Units 07/23/2020 06/17/2020 06/15/2020  WBC 4.0 - 10.5 K/uL 5.1 5.8 10.1  Hemoglobin 13.0 - 17.0 g/dL 12.4(L) 12.3(L) 13.8  Hematocrit 39.0 - 52.0 % 38.8(L) 38.5(L) 43.1  Platelets 150 - 400 K/uL 298 330 314     CMP Latest Ref Rng & Units 07/23/2020 06/15/2020 06/14/2020  Glucose 70 - 99 mg/dL 99 111(H) 118(H)  BUN 8 - 23 mg/dL 11 10 9   Creatinine 0.61 - 1.24 mg/dL 1.16 1.09 1.16  Sodium 135 - 145 mmol/L 141 133(L) 136  Potassium 3.5 - 5.1 mmol/L 3.8 4.0 4.1  Chloride 98 - 111 mmol/L 108 101 105  CO2 22 - 32 mmol/L 25 23 22   Calcium 8.9 - 10.3 mg/dL 9.3 9.1 8.6(L)  Total Protein 6.5 - 8.1 g/dL 7.7 - -  Total Bilirubin 0.3 - 1.2 mg/dL 0.3 - -  Alkaline Phos 38 - 126 U/L 85 - -  AST 15 - 41 U/L 33 - -  ALT 0 - 44 U/L 18 - -      RADIOGRAPHIC STUDIES: I have personally reviewed the radiological images as listed and agreed with the findings in the report. No results found.   ASSESSMENT & PLAN:  Andrew Hines is a 60 y.o. male with    1.  Invasive adenocarcinoma of the ascending colon, grade 2, pT3pN1bM0 stage IIIB, MMR normal He presented with 1 month history of bloody stools, found to have invasive adenocarcinoma in the ascending colon on first colonoscopy on 04/17/20. Staging work-up showed renal and liver cysts, otherwise negative for distant metastatic disease. Pre-op CEA not done. -He underwent right hemicolectomy on 06/13/20 with Dr Dema Severin. Surgical path showed grade 2 adenocarcinoma invading through the muscularis propria, LVI+, metastatic carcinoma in 2 of 19 lymph nodes.  Margins were clear. There was no perineural  invasion or perforation. MMR is normal.  -We discussed his colon cancer was resected but given the lymph node involvement carries a moderate to high risk of recurrence. -I started him on adjuvant chemo CAPOX q3weeks with Xeloda 1520m in the AM and 20050min the PM, 2 weeks/1 week off starting 07/23/20 for 3 months to reduce his risk of recurrence. We discussed if he does not tolerate CAPOX we  can switch to FOLFOX. -He has recovered very well from surgery.  Labs reviewed and adequate to proceed with C1 Oxaliplatin today along with Xeloda.  -Phone call next week for toxicity check. Lab, f/u and chemo in 3 weeks.    2. Genetics -Due to his personal colon cancer and family history of colon and prostate cancers, he qualifies for genetics. He is interested and agreeable, I previously referred him. -his daughter's mother had breast cancer, daughter is reportedly negative but unclear if just BRCA or full genetics panel.  -patient's 3 children understand their risks and are agreeable to cancer screening. Pt also has siblings who will need screening and possible genetic testing.   3. Health promotion/disease prevention -He smoked cigarettes for 5 years, quit in April 30, 1991 after his father died of lung cancer. He knows to abstain from smoking -He has had gin/tonic drink daily x25 years. I recommend to stop alcohol during chemo, and cut back significantly to reduce risk of colon cancer recurrence and developing new cancers in the future. He is agreeable -he is UTD on covid-19 vaccines   PLAN: -Gave paper script for Zofran  -Labs reviewed and adequate to proceed with C1 Oxaliplatin today  -Start C1D1 Xeloda at 1562m in the AM and 2001min the PM, 2 weeks on/1 week off today  -Phone call next week for toxicity checkup -Lab, F/u and Oxaliplatin in 3, 6, 9 weeks.    No problem-specific Assessment & Plan notes found for this encounter.   No orders of the defined types were placed in this encounter.  All  questions were answered. The patient knows to call the clinic with any problems, questions or concerns. No barriers to learning was detected. The total time spent in the appointment was 30 minutes.     YaTruitt MerleMD 07/23/2020   I, AmJoslyn Devonam acting as scribe for YaTruitt MerleMD.   I have reviewed the above documentation for accuracy and completeness, and I agree with the above.

## 2020-07-18 NOTE — Telephone Encounter (Signed)
Oral Chemotherapy Pharmacist Encounter   3rd attempt made to reach patient to provide update and offer for initial counseling on oral medication: Xeloda (capecitbaine).   Call went straight to voicemail, no answer. Left voicemail for patient to call back to discuss details of medication acquisition and initial counseling session.  Leron Croak, PharmD, BCPS Hematology/Oncology Clinical Pharmacist [facility] [phone] 07/18/2020 10:53 AM

## 2020-07-19 NOTE — Telephone Encounter (Signed)
Oral Chemotherapy Pharmacist Encounter   4th attempt made to reach patient to provide update and offer for initial counseling on oral medication: Xeloda (capecitabine).   No answer. Left voicemail for patient to call back to discuss details of medication acquisition and initial counseling session.  Leron Croak, PharmD, BCPS Hematology/Oncology Clinical Pharmacist [facility] [phone] 07/19/2020 12:10 PM

## 2020-07-20 ENCOUNTER — Telehealth: Payer: Self-pay

## 2020-07-20 ENCOUNTER — Other Ambulatory Visit (HOSPITAL_COMMUNITY): Payer: Self-pay

## 2020-07-20 NOTE — Telephone Encounter (Signed)
Oral Chemotherapy Pharmacist Encounter  I spoke with patient for overview of: Xeloda (capecitabine) for the adjuvant treatment of stage IIIB colon cancer in conjunction with infusional oxaliplatin, planned duration of 3 months of therapy.   Counseled patient on administration, dosing, side effects, monitoring, drug-food interactions, safe handling, storage, and disposal.  Patient will take Xeloda 500mg  tablets, 3 tablets (1500mg ) by mouth in AM and 4 tabs (2000mg ) by mouth in PM, within 30 minutes of finishing meals, on days 1-14 of each 21 day cycle.   Oxaliplatin will be infused on day 1 of each 21 day cycle.  Xeloda and oxaliplatin start date: 07/23/20  Adverse effects include but are not limited to: fatigue, decreased blood counts, GI upset, diarrhea, mouth sores, and hand-foot syndrome.  Patient has anti-emetic on hand and knows to take it if nausea develops.   Patient will obtain anti diarrheal and alert the office of 4 or more loose stools above baseline.  Reviewed with patient importance of keeping a medication schedule and plan for any missed doses. No barriers to medication adherence identified.  Medication reconciliation performed and medication/allergy list updated.  Insurance authorization for Xeloda has been obtained. Test claim at the pharmacy revealed copayment $93.04 for 1st fill of Xeloda. Patient will pick this up from the [facility] on 07/21/20.  Patient informed the pharmacy will reach out 5-7 days prior to needing next fill of Xeloda to coordinate continued medication acquisition to prevent break in therapy.  All questions answered.  Mr. Yapp voiced understanding and appreciation.   Medication education handout placed in mail for patient. Patient knows to call the office with questions or concerns. Oral Chemotherapy Clinic phone number provided to patient.   Leron Croak, PharmD, BCPS Hematology/Oncology Clinical Pharmacist [facility] [phone] 07/20/2020 2:25 PM

## 2020-07-20 NOTE — Telephone Encounter (Signed)
I left a vm requesting Andrew Hines return my call.

## 2020-07-21 ENCOUNTER — Other Ambulatory Visit (HOSPITAL_COMMUNITY): Payer: Self-pay

## 2020-07-23 ENCOUNTER — Inpatient Hospital Stay: Payer: No Typology Code available for payment source

## 2020-07-23 ENCOUNTER — Inpatient Hospital Stay (HOSPITAL_BASED_OUTPATIENT_CLINIC_OR_DEPARTMENT_OTHER): Payer: No Typology Code available for payment source | Admitting: Hematology

## 2020-07-23 ENCOUNTER — Other Ambulatory Visit: Payer: Self-pay

## 2020-07-23 ENCOUNTER — Encounter: Payer: Self-pay | Admitting: Hematology

## 2020-07-23 VITALS — BP 148/87 | HR 83 | Temp 97.4°F | Resp 19 | Ht 72.0 in | Wt 199.8 lb

## 2020-07-23 DIAGNOSIS — C182 Malignant neoplasm of ascending colon: Secondary | ICD-10-CM

## 2020-07-23 DIAGNOSIS — I2510 Atherosclerotic heart disease of native coronary artery without angina pectoris: Secondary | ICD-10-CM | POA: Diagnosis not present

## 2020-07-23 DIAGNOSIS — K760 Fatty (change of) liver, not elsewhere classified: Secondary | ICD-10-CM | POA: Diagnosis not present

## 2020-07-23 DIAGNOSIS — I700 Atherosclerosis of aorta: Secondary | ICD-10-CM | POA: Diagnosis not present

## 2020-07-23 DIAGNOSIS — Z5111 Encounter for antineoplastic chemotherapy: Secondary | ICD-10-CM | POA: Diagnosis not present

## 2020-07-23 DIAGNOSIS — F17210 Nicotine dependence, cigarettes, uncomplicated: Secondary | ICD-10-CM | POA: Diagnosis not present

## 2020-07-23 DIAGNOSIS — K7689 Other specified diseases of liver: Secondary | ICD-10-CM | POA: Diagnosis not present

## 2020-07-23 DIAGNOSIS — N281 Cyst of kidney, acquired: Secondary | ICD-10-CM | POA: Diagnosis not present

## 2020-07-23 DIAGNOSIS — K4090 Unilateral inguinal hernia, without obstruction or gangrene, not specified as recurrent: Secondary | ICD-10-CM | POA: Diagnosis not present

## 2020-07-23 LAB — CBC WITH DIFFERENTIAL (CANCER CENTER ONLY)
Abs Immature Granulocytes: 0.01 10*3/uL (ref 0.00–0.07)
Basophils Absolute: 0 10*3/uL (ref 0.0–0.1)
Basophils Relative: 1 %
Eosinophils Absolute: 0.1 10*3/uL (ref 0.0–0.5)
Eosinophils Relative: 1 %
HCT: 38.8 % — ABNORMAL LOW (ref 39.0–52.0)
Hemoglobin: 12.4 g/dL — ABNORMAL LOW (ref 13.0–17.0)
Immature Granulocytes: 0 %
Lymphocytes Relative: 38 %
Lymphs Abs: 1.9 10*3/uL (ref 0.7–4.0)
MCH: 26.6 pg (ref 26.0–34.0)
MCHC: 32 g/dL (ref 30.0–36.0)
MCV: 83.1 fL (ref 80.0–100.0)
Monocytes Absolute: 0.6 10*3/uL (ref 0.1–1.0)
Monocytes Relative: 12 %
Neutro Abs: 2.4 10*3/uL (ref 1.7–7.7)
Neutrophils Relative %: 48 %
Platelet Count: 298 10*3/uL (ref 150–400)
RBC: 4.67 MIL/uL (ref 4.22–5.81)
RDW: 14.9 % (ref 11.5–15.5)
WBC Count: 5.1 10*3/uL (ref 4.0–10.5)
nRBC: 0 % (ref 0.0–0.2)

## 2020-07-23 LAB — CMP (CANCER CENTER ONLY)
ALT: 18 U/L (ref 0–44)
AST: 33 U/L (ref 15–41)
Albumin: 3.7 g/dL (ref 3.5–5.0)
Alkaline Phosphatase: 85 U/L (ref 38–126)
Anion gap: 8 (ref 5–15)
BUN: 11 mg/dL (ref 8–23)
CO2: 25 mmol/L (ref 22–32)
Calcium: 9.3 mg/dL (ref 8.9–10.3)
Chloride: 108 mmol/L (ref 98–111)
Creatinine: 1.16 mg/dL (ref 0.61–1.24)
GFR, Estimated: >60 mL/min (ref >60)
Glucose, Bld: 99 mg/dL (ref 70–99)
Potassium: 3.8 mmol/L (ref 3.5–5.1)
Sodium: 141 mmol/L (ref 135–145)
Total Bilirubin: 0.3 mg/dL (ref 0.3–1.2)
Total Protein: 7.7 g/dL (ref 6.5–8.1)

## 2020-07-23 LAB — IRON AND TIBC
Iron: 22 ug/dL — ABNORMAL LOW (ref 42–163)
Saturation Ratios: 5 % — ABNORMAL LOW (ref 20–55)
TIBC: 415 ug/dL — ABNORMAL HIGH (ref 202–409)
UIBC: 393 ug/dL — ABNORMAL HIGH (ref 117–376)

## 2020-07-23 LAB — CEA (IN HOUSE-CHCC): CEA (CHCC-In House): 1.2 ng/mL (ref 0.00–5.00)

## 2020-07-23 LAB — FERRITIN: Ferritin: 14 ng/mL — ABNORMAL LOW (ref 24–336)

## 2020-07-23 MED ORDER — SODIUM CHLORIDE 0.9% FLUSH
10.0000 mL | INTRAVENOUS | Status: DC | PRN
  Filled 2020-07-23: qty 10

## 2020-07-23 MED ORDER — DEXTROSE 5 % IV SOLN
Freq: Once | INTRAVENOUS | Status: AC
Start: 2020-07-23 — End: 2020-07-23
  Filled 2020-07-23: qty 250

## 2020-07-23 MED ORDER — PALONOSETRON HCL INJECTION 0.25 MG/5ML
INTRAVENOUS | Status: AC
  Filled 2020-07-23: qty 5

## 2020-07-23 MED ORDER — HEPARIN SOD (PORK) LOCK FLUSH 100 UNIT/ML IV SOLN
500.0000 [IU] | Freq: Once | INTRAVENOUS | Status: DC | PRN
  Filled 2020-07-23: qty 5

## 2020-07-23 MED ORDER — ONDANSETRON HCL 8 MG PO TABS: 8.0000 mg | ORAL_TABLET | Freq: Two times a day (BID) | ORAL | 1 refills | Status: DC | PRN

## 2020-07-23 MED ORDER — DEXAMETHASONE SODIUM PHOSPHATE 100 MG/10ML IJ SOLN
10.0000 mg | Freq: Once | INTRAMUSCULAR | Status: AC
  Administered 2020-07-23: 10 mg via INTRAVENOUS
  Filled 2020-07-23: qty 10

## 2020-07-23 MED ORDER — OXALIPLATIN CHEMO INJECTION 100 MG/20ML
130.0000 mg/m2 | Freq: Once | INTRAVENOUS | Status: AC
  Administered 2020-07-23: 275 mg via INTRAVENOUS
  Filled 2020-07-23: qty 55

## 2020-07-23 MED ORDER — PALONOSETRON HCL INJECTION 0.25 MG/5ML
0.2500 mg | Freq: Once | INTRAVENOUS | Status: AC
Start: 2020-07-23 — End: 2020-07-23
  Administered 2020-07-23: 0.25 mg via INTRAVENOUS

## 2020-07-23 NOTE — Patient Instructions (Signed)
[facility]  Discharge Instructions: Thank you for choosing [facility] to provide your oncology and hematology care.   If you have a lab appointment with the [facility], please go directly to the [facility] and check in at the registration area.   Wear comfortable clothing and clothing appropriate for easy access to any Portacath or PICC line.   We strive to give you quality time with your provider. You may need to reschedule your appointment if you arrive late (15 or more minutes).  Arriving late affects you and other patients whose appointments are after yours.  Also, if you miss three or more appointments without notifying the office, you may be dismissed from the clinic at the provider's discretion.      For prescription refill requests, have your pharmacy contact our office and allow 72 hours for refills to be completed.    Today you received the following chemotherapy and/or immunotherapy agent: Oxaliplatin    To help prevent nausea and vomiting after your treatment, we encourage you to take your nausea medication as directed.  BELOW ARE SYMPTOMS THAT SHOULD BE REPORTED IMMEDIATELY: *FEVER GREATER THAN 100.4 F (38 C) OR HIGHER *CHILLS OR SWEATING *NAUSEA AND VOMITING THAT IS NOT CONTROLLED WITH YOUR NAUSEA MEDICATION *UNUSUAL SHORTNESS OF BREATH *UNUSUAL BRUISING OR BLEEDING *URINARY PROBLEMS (pain or burning when urinating, or frequent urination) *BOWEL PROBLEMS (unusual diarrhea, constipation, pain near the anus) TENDERNESS IN MOUTH AND THROAT WITH OR WITHOUT PRESENCE OF ULCERS (sore throat, sores in mouth, or a toothache) UNUSUAL RASH, SWELLING OR PAIN  UNUSUAL VAGINAL DISCHARGE OR ITCHING   Items with * indicate a potential emergency and should be followed up as soon as possible or go to the Emergency Department if any problems should occur.  Please show the CHEMOTHERAPY ALERT CARD or IMMUNOTHERAPY ALERT CARD at check-in to  the Emergency Department and triage nurse.  Should you have questions after your visit or need to cancel or reschedule your appointment, please contact [facility]  Dept: [phone]  and follow the prompts.  Office hours are 8:00 a.m. to 4:30 p.m. Monday - Friday. Please note that voicemails left after 4:00 p.m. may not be returned until the following business day.  We are closed weekends and major holidays. You have access to a nurse at all times for urgent questions. Please call the main number to the clinic Dept: [phone] and follow the prompts.   For any non-urgent questions, you may also contact your provider using MyChart. We now offer e-Visits for anyone 65 and older to request care online for non-urgent symptoms. For details visit [email or web address].   Also download the MyChart app! Go to the app store, search "MyChart", open the app, select [facility], and log in with your MyChart username and password.  Due to Covid, a mask is required upon entering the hospital/clinic. If you do not have a mask, one will be given to you upon arrival. For doctor visits, patients may have 1 support person aged 45 or older with them. For treatment visits, patients cannot have anyone with them due to current Covid guidelines and our immunocompromised population.   Oxaliplatin Injection What is this medication? OXALIPLATIN (ox AL i PLA tin) is a chemotherapy drug. It targets fast dividing cells, like cancer cells, and causes these cells to die. This medicine is usedto treat cancers of the colon and rectum, and many other cancers. This medicine may be used for  other purposes; ask your health care provider orpharmacist if you have questions. COMMON BRAND NAME(S): Eloxatin What should I tell my care team before I take this medication? They need to know if you have any of these conditions: heart disease history of irregular heartbeat liver disease low blood counts,  like white cells, platelets, or red blood cells lung or breathing disease, like asthma take medicines that treat or prevent blood clots tingling of the fingers or toes, or other nerve disorder an unusual or allergic reaction to oxaliplatin, other chemotherapy, other medicines, foods, dyes, or preservatives pregnant or trying to get pregnant breast-feeding How should I use this medication? This drug is given as an infusion into a vein. It is administered in a hospitalor clinic by a specially trained health care professional. Talk to your pediatrician regarding the use of this medicine in children.Special care may be needed. Overdosage: If you think you have taken too much of this medicine contact apoison control center or emergency room at once. NOTE: This medicine is only for you. Do not share this medicine with others. What if I miss a dose? It is important not to miss a dose. Call your doctor or health careprofessional if you are unable to keep an appointment. What may interact with this medication? Do not take this medicine with any of the following medications: cisapride dronedarone pimozide thioridazine This medicine may also interact with the following medications: aspirin and aspirin-like medicines certain medicines that treat or prevent blood clots like warfarin, apixaban, dabigatran, and rivaroxaban cisplatin cyclosporine diuretics medicines for infection like acyclovir, adefovir, amphotericin B, bacitracin, cidofovir, foscarnet, ganciclovir, gentamicin, pentamidine, vancomycin NSAIDs, medicines for pain and inflammation, like ibuprofen or naproxen other medicines that prolong the QT interval (an abnormal heart rhythm) pamidronate zoledronic acid This list may not describe all possible interactions. Give your health care provider a list of all the medicines, herbs, non-prescription drugs, or dietary supplements you use. Also tell them if you smoke, drink alcohol, or use  illegaldrugs. Some items may interact with your medicine. What should I watch for while using this medication? Your condition will be monitored carefully while you are receiving thismedicine. You may need blood work done while you are taking this medicine. This medicine may make you feel generally unwell. This is not uncommon as chemotherapy can affect healthy cells as well as cancer cells. Report any side effects. Continue your course of treatment even though you feel ill unless yourhealthcare professional tells you to stop. This medicine can make you more sensitive to cold. Do not drink cold drinks or use ice. Cover exposed skin before coming in contact with cold temperatures or cold objects. When out in cold weather wear warm clothing and cover your mouth and nose to warm the air that goes into your lungs. Tell your doctor if you getsensitive to the cold. Do not become pregnant while taking this medicine or for 9 months after stopping it. Women should inform their health care professional if they wish to become pregnant or think they might be pregnant. Men should not father a child while taking this medicine and for 6 months after stopping it. There is potential for serious side effects to an unborn child. Talk to your health careprofessional for more information. Do not breast-feed a child while taking this medicine or for 3 months afterstopping it. This medicine has caused ovarian failure in some women. This medicine may make it more difficult to get pregnant. Talk to your health care professional if [facility]  concerned about your fertility. This medicine has caused decreased sperm counts in some men. This may make it more difficult to father a child. Talk to your health care professional if [facility] concerned about your fertility. This medicine may increase your risk of getting an infection. Call your health care professional for advice if you get a fever, chills, or sore throat, or other symptoms of a  cold or flu. Do not treat yourself. Try to avoid beingaround people who are sick. Avoid taking medicines that contain aspirin, acetaminophen, ibuprofen, naproxen, or ketoprofen unless instructed by your health care professional.These medicines may hide a fever. Be careful brushing or flossing your teeth or using a toothpick because you may get an infection or bleed more easily. If you have any dental work done, Primary school teacher you are receiving this medicine. What side effects may I notice from receiving this medication? Side effects that you should report to your doctor or health care professionalas soon as possible: allergic reactions like skin rash, itching or hives, swelling of the face, lips, or tongue breathing problems cough low blood counts - this medicine may decrease the number of white blood cells, red blood cells, and platelets. You may be at increased risk for infections and bleeding nausea, vomiting pain, redness, or irritation at site where injected pain, tingling, numbness in the hands or feet signs and symptoms of bleeding such as bloody or black, tarry stools; red or dark brown urine; spitting up blood or brown material that looks like coffee grounds; red spots on the skin; unusual bruising or bleeding from the eyes, gums, or nose signs and symptoms of a dangerous change in heartbeat or heart rhythm like chest pain; dizziness; fast, irregular heartbeat; palpitations; feeling faint or lightheaded; falls signs and symptoms of infection like fever; chills; cough; sore throat; pain or trouble passing urine signs and symptoms of liver injury like dark yellow or brown urine; general ill feeling or flu-like symptoms; light-colored stools; loss of appetite; nausea; right upper belly pain; unusually weak or tired; yellowing of the eyes or skin signs and symptoms of low red blood cells or anemia such as unusually weak or tired; feeling faint or lightheaded; falls signs and symptoms of  muscle injury like dark urine; trouble passing urine or change in the amount of urine; unusually weak or tired; muscle pain; back pain Side effects that usually do not require medical attention (report to yourdoctor or health care professional if they continue or are bothersome): changes in taste diarrhea gas hair loss loss of appetite mouth sores This list may not describe all possible side effects. Call your doctor for medical advice about side effects. You may report side effects to FDA at1-800-FDA-1088. Where should I keep my medication? This drug is given in a hospital or clinic and will not be stored at home. NOTE: This sheet is a summary. It may not cover all possible information. If you have questions about this medicine, talk to your doctor, pharmacist, orhealth care provider.  2022 Elsevier/Gold Standard (2018-06-16 12:20:35)

## 2020-07-24 ENCOUNTER — Telehealth: Payer: Self-pay | Admitting: Hematology

## 2020-07-24 ENCOUNTER — Telehealth: Payer: Self-pay | Admitting: *Deleted

## 2020-07-24 NOTE — Telephone Encounter (Signed)
Scheduled follow-up appointment per 6/13 los. Patient is aware. 

## 2020-07-24 NOTE — Telephone Encounter (Signed)
Called pt to see how he did with his treatment yesterday & he reports doing ok.  He does report that his L arm was sore yesterday but getting better where IV was started. He started his xeloda & so far ok with that.  He denies any other problems.  Informed to watch arm & to let us know if not improved & to rotate sites for IV.  Pt expressed understanding.

## 2020-07-24 NOTE — Telephone Encounter (Signed)
-----   Message from Isabel Caprice, RN sent at 07/23/2020  5:41 PM EDT ----- Regarding: Dr. Burr Medico, first time oxaliplatin patient. Hello! Please give follow up phone call, patient tolerated oxaliplatin well with no issues. Thanks!

## 2020-07-25 NOTE — Progress Notes (Addendum)
[facility]   [phone] Fax:([phone]) [phone]   Clinic Follow up Note   Patient Care Team: Milford Cage, PA as PCP - General (Physician Assistant) Alla Feeling, NP as Nurse Practitioner (Oncology) Jonnie Finner, RN as Oncology Nurse Navigator Truitt Merle, MD as Consulting Physician (Hematology and Oncology) Ileana Roup, MD as Consulting Physician (General Surgery) Koleen Distance, MD as Referring Physician (Gastroenterology)   I connected with Andrew Hines on 07/30/2020 at  1:20 PM EDT by telephone visit and verified that I am speaking with the correct person using two identifiers.  I discussed the limitations, risks, security and privacy concerns of performing an evaluation and management service by telephone and the availability of in person appointments. I also discussed with the patient that there may be a patient responsible charge related to this service. The patient expressed understanding and agreed to proceed.   Other persons participating in the visit and their role in the encounter:  None  Patient's location:  His home  Provider's location:  My Office   CHIEF COMPLAINT:  F/u of colon cancer   SUMMARY OF ONCOLOGIC HISTORY: Oncology History Overview Note  Cancer Staging Malignant neoplasm of ascending colon [facility]) Staging form: Colon and Rectum, AJCC 8th Edition - Pathologic stage from 06/13/2020: Stage IIIB (pT3, pN1b, cM0) - Signed by Alla Feeling, NP on 07/03/2020 Stage prefix: Initial diagnosis Histologic grading system: 4 grade system Histologic grade (G): G2 Lymph-vascular invasion (LVI): LVI present/identified, NOS Perineural invasion (PNI): Absent     Malignant neoplasm of ascending colon ([place])  04/17/2020 Procedure   Colonoscopy at [facility], by Dr. Twana First Shahid A mass was found in the proximal ascending colon, 1/3 circumference of the lumen, nonobstructing, ulcerated and friable multiple biopsies  were performed.  A single polyp measuring 4 mm was found in the descending colon, a polypectomy was performed.  The resection was complete.  Medium size internal hemorrhoids were found.   04/17/2020 Initial Biopsy   Path : Proximal ascending colon: Invasive moderately differentiated adenocarcinoma arising in a tubular adenoma with high-grade dysplasia  Descending colon polyp: tubular adenoma   04/17/2020 Imaging   Staging CT A/P 04/17/20 at [facility]: Impression The lung bases are clear.  Focal 6 mm low-density lesion in the right lobe of the liver which does not enhance and likely represents a small cyst.  No altered areas of attenuation are identified to suggest hepatic mass.  The spleen, pancreas, adrenal glands have a normal appearance.  There are cortical cysts in both kidneys.  Both kidneys function normally.  Large amount of gas in the colon and this post colonoscopy patient.  No obvious colonic masses.  No pneumoperitoneum.  No free fluid or abdominal fluid collections.  No lytic or blastic lesions.  Right inguinal hernia filled with fat.  Impression: Renal cyst with probable hepatic cyst without definite evidence of metastatic disease.  Right inguinal hernia.   06/04/2020 Imaging   Staging CT chest IMPRESSION: 1. No evidence of metastatic disease. 2. Hepatic steatosis. 3. Aortic atherosclerosis (ICD10-I70.0). Coronary artery calcification. 4. Enlarged pulmonary arteries, indicative of pulmonary arterial hypertension.     06/13/2020 Cancer Staging   Staging form: Colon and Rectum, AJCC 8th Edition - Pathologic stage from 06/13/2020: Stage IIIB (pT3, pN1b, cM0) - Signed by Alla Feeling, NP on 07/03/2020  Stage prefix: Initial diagnosis  Histologic grading system: 4 grade system  Histologic grade (G): G2  Lymph-vascular invasion (LVI): LVI present/identified, NOS  Perineural invasion (PNI): Absent    06/13/2020 Surgery   PROCEDURE: Laparoscopic-assisted right hemicolectomy 2.  Lysis of adhesions x 90 minutes 3. Partial omentectomy 4. Bilateral transversus abdominus plane blocks SURGEON: [name], MD   06/13/2020 Pathology Results   FINAL MICROSCOPIC DIAGNOSIS:  A. COLON, RIGHT AND TERMINAL ILEUM, RESECTION:  - Invasive adenocarcinoma, moderately differentiated, spanning 3.8 cm.  - Tumor invades through muscularis propria into pericolonic tissue.  - Lymphovascular invasion present.  - Resection margins are negative.  - Metastatic carcinoma in two of nineteen lymph nodes (2/19).  - See oncology table.   B. OMENTUM, RESECTION:  - Benign fibroadipose tissue.   pT3pN1b   IHC EXPRESSION RESULTS  TEST           RESULT  MLH1:          Preserved nuclear expression  MSH2:          Preserved nuclear expression  MSH6:          Preserved nuclear expression  PMS2:          Preserved nuclear expression    06/13/2020 Initial Diagnosis   Malignant neoplasm of ascending colon ([place] [facility])    07/23/2020 -  Chemotherapy   CAPOX q3weeks with Xeloda 1519m in the AM and 20027min the PM starting 07/23/20.        CURRENT THERAPY:  CAPOX q3weeks with Xeloda 150067mn the AM and 2000m76m the PM starting 07/23/20  INTERVAL HISTORY:  Andrew Hines for a virtual follow up. He notes on day 4 he had a bad day with nausea, fatigue and was able to recover the rest of the week. He took antiemetics which helped. He notes he is eating fairly with lower appetite. He notes he weighs himself every Monday, last he was 199 pounds. He is starting week 2 of Xeloda at 1500mg57mthe AM and 2000mg 58mhe PM. He notes first thing in the morning he has sour taste and intense pain in back of throat feeling with first bite or drink of something. He has been able to start cold drinks the past 1-2 days.     REVIEW OF SYSTEMS:   Constitutional: Denies fevers, chills or abnormal weight loss (+) Taste change  Eyes: Denies blurriness of vision Ears, nose, mouth, throat, and face:  Denies mucositis or sore throat Respiratory: Denies cough, dyspnea or wheezes Cardiovascular: Denies palpitation, chest discomfort or lower extremity swelling Gastrointestinal:  Denies nausea, heartburn or change in bowel habits Skin: Denies abnormal skin rashes Lymphatics: Denies new lymphadenopathy or easy bruising Neurological:Denies numbness, tingling or new weaknesses (+) Cold sensitivity Behavioral/Psych: Mood is stable, no new changes  All other systems were reviewed with the patient and are negative.  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  ALLERGIES:  has No Known Allergies.  MEDICATIONS:  Current Outpatient Medications  Medication Sig Dispense Refill   capecitabine (XELODA)  500 MG tablet Take 3 tabs in morning and 4 tabs in evening for 14 days then off 7 days. Repeat every 21 days. Take within 30 minutes after meals. 98 tablet 0   Cholecalciferol (VITAMIN D) 50 MCG (2000 UT) tablet Take 2,000 Units by mouth daily.     diphenhydrAMINE HCl, Sleep, 50 MG CAPS Take 100 mg by mouth at bedtime.     ondansetron (ZOFRAN) 8 MG tablet Take 1 tablet (8 mg total) by mouth 2 (two) times daily as needed for refractory nausea / vomiting. Start on day 3 after chemotherapy. 30 tablet 1   prochlorperazine (COMPAZINE) 10 MG tablet Take 1 tablet (10 mg total) by mouth every 6 (six) hours as needed (Nausea or vomiting). 30 tablet 1   sildenafil (VIAGRA) 100 MG tablet Take  100 mg by mouth daily as needed for erectile dysfunction.     Tetrahydrozoline HCl (VISINE OP) Place 1 drop into both eyes daily.     No current facility-administered medications for this visit.    PHYSICAL EXAMINATION: ECOG PERFORMANCE STATUS: 1 - Symptomatic but completely ambulatory  No vitals taken today, Exam not performed today   LABORATORY DATA:  I have reviewed the data as listed CBC Latest Ref Rng & Units 07/23/2020 06/17/2020 06/15/2020  WBC 4.0 - 10.5 K/uL 5.1 5.8 10.1  Hemoglobin 13.0 - 17.0 g/dL 12.4(L) 12.3(L) 13.8  Hematocrit 39.0 - 52.0 % 38.8(L) 38.5(L) 43.1  Platelets 150 - 400 K/uL 298 330 314     CMP Latest Ref Rng & Units 07/23/2020 06/15/2020 06/14/2020  Glucose 70 - 99 mg/dL 99 111(H) 118(H)  BUN 8 - 23 mg/dL _0 Creatinine 0.61 - 1.24 mg/dL 1.16 1.09 1.16  Sodium 135 - 145 mmol/L 141 133(L) 136  Potassium 3.5 - 5.1 mmol/L 3.8 4.0 4.1  Chloride 98 - 111 mmol/L 108 101 105  CO2 22 - 32 mmol/L _1 Calcium 8.9 - 10.3 mg/dL 9.3 9.1 8.6(L)  Total Protein 6.5 - 8.1 g/dL 7.7 - -  Total Bilirubin 0.3 - 1.2 mg/dL 0.3 - -  Alkaline Phos 38 - 126 U/L 85 - -  AST 15 - 41 U/L 33 - -  ALT 0 - 44 U/L 18 - -      RADIOGRAPHIC STUDIES: I have personally reviewed the radiological images as listed and agreed with the findings in the report. No results found.   ASSESSMENT & PLAN:  Andrew Hines is a 60 y.o. male with   1.  Invasive adenocarcinoma of the ascending colon, grade 2, pT3pN1bM0 stage IIIB, MMR normal -He presented with 1 month history of bloody stools, found to have invasive adenocarcinoma in the ascending colon on first colonoscopy on 04/17/20. Staging work-up showed renal and liver cysts, otherwise negative for distant metastatic disease. Pre-op CEA not done. -He underwent right hemicolectomy on 06/13/20 with Dr Dema Severin. Surgical path showed grade 2 adenocarcinoma invading through the muscularis propria, LVI+, metastatic carcinoma in 2 of 19 lymph nodes.   Margins were clear. There was no perineural invasion or perforation. MMR is normal.  -We discussed his colon cancer was resected but given the lymph node involvement carries a moderate to high risk of recurrence. -I started him on adjuvant chemo CAPOX q3weeks with Xeloda 1525m in the AM and 20064min the PM, 2 weeks/1 week off starting 07/23/20 for 3 months to reduce his risk of recurrence. We discussed if he does not tolerate CAPOX we can switch to FOLFOX. -  He tolerated first week of treatment well overall with cold sensitivity, taste change and lower appetite. He is still able to eat well. Will continue with week 2 Xeloda at same dose. He should be able to recover on off week -F/u 7/6 during first week of C2. Plan to start C2 on 08/13/20.    2. Genetics -Due to his personal colon cancer and family history of colon and prostate cancers, he qualifies for genetics. He is interested and agreeable, I previously referred him. -his daughter's mother had breast cancer, daughter is reportedly negative but unclear if just BRCA or full genetics panel. -patient's 3 children understand their risks and are agreeable to cancer screening. Pt also has siblings who will need screening and possible genetic testing.   3. Health promotion/disease prevention -He smoked cigarettes for 5 years, quit in 04/20/1991 after his father died of lung cancer. He knows to abstain from smoking -He has had gin/tonic drink daily x25 years. I recommend to stop alcohol during chemo, and cut back significantly to reduce risk of colon cancer recurrence and developing new cancers in the future. He is agreeable -he is UTD on COVID-19 vaccines   PLAN: -Continue C1 Xeloda at 1552m in the AM and 20076min the PM, 2 weeks on/1 week off  -Start C2 Xeloda on 7/4 or 7/6  -Lab, F/u and Oxaliplatin on 7/6   No problem-specific Assessment & Plan notes found for this encounter.   No orders of the defined types were placed in this encounter.  I  discussed the assessment and treatment plan with the patient. The patient was provided an opportunity to ask questions and all were answered. The patient agreed with the plan and demonstrated an understanding of the instructions.  The patient was advised to call back or seek an in-person evaluation if the symptoms worsen or if the condition fails to improve as anticipated.  The total time spent in the appointment was 15 minutes.    YaTruitt MerleMD 07/30/2020   I, AmJoslyn Devonam acting as scribe for YaTruitt MerleMD.   I have reviewed the above documentation for accuracy and completeness, and I agree with the above.

## 2020-07-27 ENCOUNTER — Telehealth: Payer: Self-pay

## 2020-07-27 NOTE — Telephone Encounter (Signed)
This nurse made patient aware of Andrew Rue, NP results and recommendations.  Patient states he prefers to take oral Iron OTC. He acknowledged understanding of orders.  Patient knows to call clinic with any problems, questons or concerns.

## 2020-07-27 NOTE — Telephone Encounter (Signed)
-----   Message from Alla Feeling, NP sent at 07/25/2020  2:29 PM EDT ----- Please let him know he has moderate iron deficiency from recent colon cancer and surgery, he needs iron replacement. I recommend IV iron, which we can do at next oxali infusion and as needed. Or he can try oral iron first, if he prefers that please encourage ferrous sulfate 1 tab BID taken with vit C/orange juice for better absorption.  Thanks, Regan Rakers, NP

## 2020-07-30 ENCOUNTER — Encounter: Payer: Self-pay | Admitting: Hematology

## 2020-07-30 ENCOUNTER — Ambulatory Visit (HOSPITAL_BASED_OUTPATIENT_CLINIC_OR_DEPARTMENT_OTHER): Payer: No Typology Code available for payment source | Admitting: Hematology

## 2020-07-30 DIAGNOSIS — C182 Malignant neoplasm of ascending colon: Secondary | ICD-10-CM | POA: Diagnosis not present

## 2020-07-31 NOTE — Progress Notes (Signed)
Left voice message for patient regarding not coming for Genetics appointment on 07/18/2020.  I have asked him if he would be willing to reschedule and to call me back on my direct line to let me know.

## 2020-08-06 ENCOUNTER — Other Ambulatory Visit (HOSPITAL_COMMUNITY): Payer: Self-pay

## 2020-08-08 ENCOUNTER — Other Ambulatory Visit (HOSPITAL_COMMUNITY): Payer: Self-pay

## 2020-08-10 ENCOUNTER — Other Ambulatory Visit (HOSPITAL_COMMUNITY): Payer: Self-pay

## 2020-08-13 ENCOUNTER — Other Ambulatory Visit: Payer: Self-pay | Admitting: Hematology

## 2020-08-13 NOTE — Progress Notes (Signed)
[facility]   [phone] [phone]   Clinic Follow up Note   Patient Care Team: Milford Cage, PA as PCP - General (Physician Assistant) Alla Feeling, NP as Nurse Practitioner (Oncology) Jonnie Finner, RN (Inactive) as Oncology Nurse Navigator Truitt Merle, MD as Consulting Physician (Hematology and Oncology) Ileana Roup, MD as Consulting Physician (General Surgery) Koleen Distance, MD as Referring Physician (Gastroenterology) 08/15/2020  CHIEF COMPLAINT: Follow up colon cancer   SUMMARY OF ONCOLOGIC HISTORY: Oncology History Overview Note  Cancer Staging Malignant neoplasm of ascending colon [facility]) Staging form: Colon and Rectum, AJCC 8th Edition - Pathologic stage from 06/13/2020: Stage IIIB (pT3, pN1b, cM0) - Signed by Alla Feeling, NP on 07/03/2020 Stage prefix: Initial diagnosis Histologic grading system: 4 grade system Histologic grade (G): G2 Lymph-vascular invasion (LVI): LVI present/identified, NOS Perineural invasion (PNI): Absent     Malignant neoplasm of ascending colon ([place])  04/17/2020 Procedure   Colonoscopy at [facility], by Dr. Twana First Shahid A mass was found in the proximal ascending colon, 1/3 circumference of the lumen, nonobstructing, ulcerated and friable multiple biopsies were performed.  A single polyp measuring 4 mm was found in the descending colon, a polypectomy was performed.  The resection was complete.  Medium size internal hemorrhoids were found.   04/17/2020 Initial Biopsy   Path : Proximal ascending colon: Invasive moderately differentiated adenocarcinoma arising in a tubular adenoma with high-grade dysplasia  Descending colon polyp: tubular adenoma   04/17/2020 Imaging   Staging CT A/P 04/17/20 at [facility]: Impression The lung bases are clear.  Focal 6 mm low-density lesion in the right lobe of the liver which does not enhance and likely represents a small cyst.  No altered areas of  attenuation are identified to suggest hepatic mass.  The spleen, pancreas, adrenal glands have a normal appearance.  There are cortical cysts in both kidneys.  Both kidneys function normally.  Large amount of gas in the colon and this post colonoscopy patient.  No obvious colonic masses.  No pneumoperitoneum.  No free fluid or abdominal fluid collections.  No lytic or blastic lesions.  Right inguinal hernia filled with fat.  Impression: Renal cyst with probable hepatic cyst without definite evidence of metastatic disease.  Right inguinal hernia.   06/04/2020 Imaging   Staging CT chest IMPRESSION: 1. No evidence of metastatic disease. 2. Hepatic steatosis. 3. Aortic atherosclerosis (ICD10-I70.0). Coronary artery calcification. 4. Enlarged pulmonary arteries, indicative of pulmonary arterial hypertension.     06/13/2020 Cancer Staging   Staging form: Colon and Rectum, AJCC 8th Edition - Pathologic stage from 06/13/2020: Stage IIIB (pT3, pN1b, cM0) - Signed by Alla Feeling, NP on 07/03/2020  Stage prefix: Initial diagnosis  Histologic grading system: 4 grade system  Histologic grade (G): G2  Lymph-vascular invasion (LVI): LVI present/identified, NOS  Perineural invasion (PNI): Absent    06/13/2020 Surgery   PROCEDURE: Laparoscopic-assisted right hemicolectomy 2. Lysis of adhesions x 90 minutes 3. Partial omentectomy 4. Bilateral transversus abdominus plane blocks SURGEON: [name], MD   06/13/2020 Pathology Results   FINAL MICROSCOPIC DIAGNOSIS:  A. COLON, RIGHT AND TERMINAL ILEUM, RESECTION:  - Invasive adenocarcinoma, moderately differentiated, spanning 3.8 cm.  - Tumor invades through muscularis propria into pericolonic tissue.  - Lymphovascular invasion present.  - Resection margins are negative.  - Metastatic carcinoma in two of nineteen lymph nodes (2/19).  - See oncology table.   B. OMENTUM, RESECTION:  - Benign fibroadipose tissue.  pT3pN1b   IHC  EXPRESSION RESULTS  TEST           RESULT  MLH1:          Preserved nuclear expression  MSH2:          Preserved nuclear expression  MSH6:          Preserved nuclear expression  PMS2:          Preserved nuclear expression    06/13/2020 Initial Diagnosis   Malignant neoplasm of ascending colon ([place])    07/23/2020 -  Chemotherapy   CAPOX q3weeks with Xeloda 1563m in the AM and 20035min the PM starting 07/23/20.       CURRENT THERAPY: Adjuvant CAPOX q3weeks with Xeloda 150069mn the AM and 2000m26m the PM starting 07/23/20 (3 months)  INTERVAL HISTORY: [name] for follow up and treatment as scheduled to begin C2 CAPOX.  He began Xeloda 1500 mg a.m./2000 mg p.m. on 7/5 and needs a refill.  With cycle 1, he had low energy and appetite, nausea x1, and cold sensitivity for 2 weeks.  Bowels remain normal, no hand/foot syndrome.  No neuropathy in the absence of cold.  He was able to recover well on his week off.  He continues to work 3 days/week when he can.  Denies fever, chills, cough, chest pain, dyspnea.  After oxalic infusion he had pain in the left arm, shoulder, and neck that was tender to touch.  He noticed a little swelling near the shoulder.  He consulted his daughter and son-in-law who are in the medical field and felt this may have been related to the infusion time.  He had no other issues with Oxilan fusion.  He needs dental work done, plans to wait until chemo is complete.   MEDICAL HISTORY:  Past Medical History:  Diagnosis Date   Cancer (HCC)[place] colon cancer, basal cell    Pneumonia    2020     SURGICAL HISTORY: Past Surgical History:  Procedure Laterality Date   APPENDECTOMY     LAPAROSCOPIC RIGHT HEMI COLECTOMY N/A 06/13/2020   Procedure: LAPAROSCOPIC RIGHT HEMI COLECTOMY AND PARTIAL OMENTECTOMY WITH LYSIS OF ADHESIONS;  Surgeon: [name];  Location: [facility];  Service: General;  Laterality: N/A;    I have reviewed the social history and family  history with the patient and they are unchanged from previous note.  ALLERGIES:  has No Known Allergies.  MEDICATIONS:  Current Outpatient Medications  Medication Sig Dispense Refill   capecitabine (XELODA) 500 MG tablet Take 3 tabs in morning and 4 tabs in evening for 14 days then off 7 days. Repeat every 21 days. Take within 30 minutes after meals. 98 tablet 1   Cholecalciferol (VITAMIN D) 50 MCG (2000 UT) tablet Take 2,000 Units by mouth daily.     diphenhydrAMINE HCl, Sleep, 50 MG CAPS Take 100 mg by mouth at bedtime.     ondansetron (ZOFRAN) 8 MG tablet Take 1 tablet (8 mg total) by mouth 2 (two) times daily as needed for refractory nausea / vomiting. Start on day 3 after chemotherapy. 30 tablet 1   prochlorperazine (COMPAZINE) 10 MG tablet Take 1 tablet (10 mg total) by mouth every 6 (six) hours as needed (Nausea or vomiting). 30 tablet 1   sildenafil (VIAGRA) 100 MG tablet Take 100 mg by mouth daily as needed for erectile dysfunction.     Tetrahydrozoline HCl (VISINE OP) Place 1 drop into both  eyes daily.     No current facility-administered medications for this visit.   Facility-Administered Medications Ordered in Other Visits  Medication Dose Route Frequency Provider Last Rate Last Admin   dexamethasone (DECADRON) 10 mg in sodium chloride 0.9 % 50 mL IVPB  10 mg Intravenous Once Truitt Merle, MD       oxaliplatin (ELOXATIN) 275 mg in dextrose 5 % 500 mL chemo infusion  130 mg/m2 (Treatment Plan Recorded) Intravenous Once Truitt Merle, MD       palonosetron (ALOXI) injection 0.25 mg  0.25 mg Intravenous Once Truitt Merle, MD        PHYSICAL EXAMINATION: ECOG PERFORMANCE STATUS: 1 - Symptomatic but completely ambulatory  Vitals:   08/15/20 0854  BP: (!) 138/93  Pulse: 81  Resp: 18  Temp: 97.9 F (36.6 C)  SpO2: 97%   Filed Weights   08/15/20 0854  Weight: 202 lb 6.4 oz (91.8 kg)    GENERAL:alert, no distress and comfortable SKIN: No rash EYES: sclera clear OROPHARYNX: No  thrush or ulcers or obvious gingival/periodontal disease NECK: Without mass LYMPH:  no palpable cervical or supraclavicular lymphadenopathy  LUNGS:  normal breathing effort HEART: no lower extremity edema Musculoskeletal: Normal equal strength in the upper extremities NEURO: alert & oriented x 3 with fluent speech, no focal motor/sensory deficits No PAC  LABORATORY DATA:  I have reviewed the data as listed CBC Latest Ref Rng & Units 08/15/2020 07/23/2020 06/17/2020  WBC 4.0 - 10.5 K/uL 4.2 5.1 5.8  Hemoglobin 13.0 - 17.0 g/dL 13.1 12.4(L) 12.3(L)  Hematocrit 39.0 - 52.0 % 39.9 38.8(L) 38.5(L)  Platelets 150 - 400 K/uL 291 298 330     CMP Latest Ref Rng & Units 08/15/2020 07/23/2020 06/15/2020  Glucose 70 - 99 mg/dL 107(H) 99 111(H)  BUN 8 - 23 mg/dL 11 11 10   Creatinine 0.61 - 1.24 mg/dL 1.12 1.16 1.09  Sodium 135 - 145 mmol/L 139 141 133(L)  Potassium 3.5 - 5.1 mmol/L 4.1 3.8 4.0  Chloride 98 - 111 mmol/L 108 108 101  CO2 22 - 32 mmol/L 24 25 23   Calcium 8.9 - 10.3 mg/dL 8.7(L) 9.3 9.1  Total Protein 6.5 - 8.1 g/dL 7.1 7.7 -  Total Bilirubin 0.3 - 1.2 mg/dL 0.4 0.3 -  Alkaline Phos 38 - 126 U/L 85 85 -  AST 15 - 41 U/L 23 33 -  ALT 0 - 44 U/L 12 18 -      RADIOGRAPHIC STUDIES: I have personally reviewed the radiological images as listed and agreed with the findings in the report. No results found.   ASSESSMENT & PLAN: 60 year old male with no significant PMH   1.  Invasive adenocarcinoma of the ascending colon, grade 2, pT3pN1bM0 stage IIIB, MMR normal -He presented with 1 month history of bloody stools, found to have invasive adenocarcinoma in the ascending colon on first colonoscopy. Staging work-up showed renal and liver cysts, otherwise negative for distant metastatic disease. Pre-op CEA not done. -He underwent right hemicolectomy on 06/13/20 with Dr. Dema Severin, path showed grade 2 adenocarcinoma invading through the muscularis propria, LVI+, metastatic carcinoma in 2 of 19 lymph  nodes.  Margins were clear. There was no perineural invasion or perforation. MMR is normal -We discussed his colon cancer was resected but given the lymph node involvement carries a moderate to high risk of recurrence. -To reduce his recurrence risk he started adjuvant Capox every 3 weeks with Xeloda 1500 mg AM/2000 mg p.m. for 2 weeks on/1 week off  starting 07/23/2020 for 3 months.  -Goal of adjuvant chemo is curative -He will then proceed with surveillance including a physical exam and lab test (including CBC, CMP and CEA) every 3 months for the first 2-3 years, then every 6-12 months, colonoscopy in one year, and surveilliance CT scan every year for 3 years. He is agreeable. -S/p cycle 1 Capox 6/13, started cycle 2 on 7/5  2.  Genetics -Due to his personal colon cancer and family history of colon and prostate cancers, he qualifies for genetics. He is interested and agreeable, I referred him. -his daughter's mother had breast cancer, daughter is reportedly negative but unclear if just BRCA or full genetics panel. -patient's 3 children understand their risks and are agreeable to cancer screening. Pt also has siblings who will need screening and possible genetic testing.   3.  Health promotion/disease prevention -He smoked cigarettes for 5 years, quit in 04-22-91 after his father died of lung cancer. He knows to abstain from smoking -He has had gin/tonic drink daily x25 years. I recommend to stop alcohol during chemo, and cut back significantly to reduce risk of colon cancer recurrence and developing new cancers in the future. He is agreeable -he is UTD on covid-19 vaccines  Disposition: Mr. Pruiett appears stable.  He completed cycle 1 Capox with oxaliplatin on day 1 and Xeloda 1500 mg a.m./2000 mg p.m. for 2 weeks on/1 week off.  Tolerated well with mild fatigue, low appetite, nausea, and cold sensitivity.  Side effects are well managed with supportive care at home.  He is able to recover, function  well, and continue working 3 days/week.  He had dysesthesia in the left arm up to his neck after first Oxaliplatin, peripheral IV was on that side. I recommend to use different vein on R arm.  The plan is to extend the infusion time to 3 hours today monitor closely. I discussed with pharmacy and infusion nurse.   Labs reviewed, anemia resolved on oral iron, otherwise stable and adequate to proceed with cycle 2 Oxaliplatin today as planned. Continue Xeloda at same dose, started 08/14/20.   He will return for lab and f/up with cycle 3 in 3 weeks. He knows to be off Xeloda for full 7 days between cycles.   All questions were answered. The patient knows to call the clinic with any problems, questions or concerns. No barriers to learning were detected. Total encounter time was 30 minutes.      Alla Feeling, NP 08/15/20

## 2020-08-15 ENCOUNTER — Inpatient Hospital Stay: Payer: No Typology Code available for payment source

## 2020-08-15 ENCOUNTER — Encounter: Payer: Self-pay | Admitting: Nurse Practitioner

## 2020-08-15 ENCOUNTER — Other Ambulatory Visit (HOSPITAL_COMMUNITY): Payer: Self-pay

## 2020-08-15 ENCOUNTER — Encounter: Payer: Self-pay | Admitting: Hematology

## 2020-08-15 ENCOUNTER — Inpatient Hospital Stay: Payer: No Typology Code available for payment source | Attending: Hematology | Admitting: Nurse Practitioner

## 2020-08-15 ENCOUNTER — Other Ambulatory Visit: Payer: Self-pay

## 2020-08-15 VITALS — BP 138/93 | HR 81 | Temp 97.9°F | Resp 18 | Ht 72.0 in | Wt 202.4 lb

## 2020-08-15 DIAGNOSIS — F1020 Alcohol dependence, uncomplicated: Secondary | ICD-10-CM | POA: Diagnosis not present

## 2020-08-15 DIAGNOSIS — C182 Malignant neoplasm of ascending colon: Secondary | ICD-10-CM

## 2020-08-15 DIAGNOSIS — R208 Other disturbances of skin sensation: Secondary | ICD-10-CM | POA: Insufficient documentation

## 2020-08-15 DIAGNOSIS — Z800 Family history of malignant neoplasm of digestive organs: Secondary | ICD-10-CM | POA: Insufficient documentation

## 2020-08-15 DIAGNOSIS — Z8042 Family history of malignant neoplasm of prostate: Secondary | ICD-10-CM | POA: Diagnosis not present

## 2020-08-15 DIAGNOSIS — Z5111 Encounter for antineoplastic chemotherapy: Secondary | ICD-10-CM | POA: Diagnosis not present

## 2020-08-15 DIAGNOSIS — Z87891 Personal history of nicotine dependence: Secondary | ICD-10-CM | POA: Diagnosis not present

## 2020-08-15 LAB — CMP (CANCER CENTER ONLY)
ALT: 12 U/L (ref 0–44)
AST: 23 U/L (ref 15–41)
Albumin: 3.4 g/dL — ABNORMAL LOW (ref 3.5–5.0)
Alkaline Phosphatase: 85 U/L (ref 38–126)
Anion gap: 7 (ref 5–15)
BUN: 11 mg/dL (ref 8–23)
CO2: 24 mmol/L (ref 22–32)
Calcium: 8.7 mg/dL — ABNORMAL LOW (ref 8.9–10.3)
Chloride: 108 mmol/L (ref 98–111)
Creatinine: 1.12 mg/dL (ref 0.61–1.24)
GFR, Estimated: >60 mL/min (ref >60)
Glucose, Bld: 107 mg/dL — ABNORMAL HIGH (ref 70–99)
Potassium: 4.1 mmol/L (ref 3.5–5.1)
Sodium: 139 mmol/L (ref 135–145)
Total Bilirubin: 0.4 mg/dL (ref 0.3–1.2)
Total Protein: 7.1 g/dL (ref 6.5–8.1)

## 2020-08-15 LAB — CBC WITH DIFFERENTIAL (CANCER CENTER ONLY)
Abs Immature Granulocytes: 0.01 10*3/uL (ref 0.00–0.07)
Basophils Absolute: 0 10*3/uL (ref 0.0–0.1)
Basophils Relative: 1 %
Eosinophils Absolute: 0.1 10*3/uL (ref 0.0–0.5)
Eosinophils Relative: 2 %
HCT: 39.9 % (ref 39.0–52.0)
Hemoglobin: 13.1 g/dL (ref 13.0–17.0)
Immature Granulocytes: 0 %
Lymphocytes Relative: 42 %
Lymphs Abs: 1.8 10*3/uL (ref 0.7–4.0)
MCH: 27.8 pg (ref 26.0–34.0)
MCHC: 32.8 g/dL (ref 30.0–36.0)
MCV: 84.7 fL (ref 80.0–100.0)
Monocytes Absolute: 0.6 10*3/uL (ref 0.1–1.0)
Monocytes Relative: 15 %
Neutro Abs: 1.7 10*3/uL (ref 1.7–7.7)
Neutrophils Relative %: 40 %
Platelet Count: 291 10*3/uL (ref 150–400)
RBC: 4.71 MIL/uL (ref 4.22–5.81)
RDW: 18.2 % — ABNORMAL HIGH (ref 11.5–15.5)
WBC Count: 4.2 10*3/uL (ref 4.0–10.5)
nRBC: 0 % (ref 0.0–0.2)

## 2020-08-15 MED ORDER — SODIUM CHLORIDE 0.9 % IV SOLN
10.0000 mg | Freq: Once | INTRAVENOUS | Status: AC
  Administered 2020-08-15: 10 mg via INTRAVENOUS
  Filled 2020-08-15: qty 10

## 2020-08-15 MED ORDER — PALONOSETRON HCL INJECTION 0.25 MG/5ML
0.2500 mg | Freq: Once | INTRAVENOUS | Status: AC
  Administered 2020-08-15: 0.25 mg via INTRAVENOUS

## 2020-08-15 MED ORDER — CAPECITABINE 500 MG PO TABS
ORAL_TABLET | ORAL | 1 refills | Status: DC
  Filled 2020-08-15: qty 98, 21d supply, fill #0
  Filled 2020-09-06: qty 98, 21d supply, fill #1

## 2020-08-15 MED ORDER — PALONOSETRON HCL INJECTION 0.25 MG/5ML
INTRAVENOUS | Status: AC
  Filled 2020-08-15: qty 5

## 2020-08-15 MED ORDER — OXALIPLATIN CHEMO INJECTION 100 MG/20ML
130.0000 mg/m2 | Freq: Once | INTRAVENOUS | Status: AC
  Administered 2020-08-15: 275 mg via INTRAVENOUS
  Filled 2020-08-15: qty 55

## 2020-08-15 MED ORDER — DEXTROSE 5 % IV SOLN
Freq: Once | INTRAVENOUS | Status: AC
  Filled 2020-08-15: qty 250

## 2020-08-15 NOTE — Patient Instructions (Signed)
[facility] MEDICAL ONCOLOGY  Discharge Instructions: Thank you for choosing [facility] to provide your oncology and hematology care.   If you have a lab appointment with the Cancer Center, please go directly to the Cancer Center and check in at the registration area.   Wear comfortable clothing and clothing appropriate for easy access to any Portacath or PICC line.   We strive to give you quality time with your provider. You may need to reschedule your appointment if you arrive late (15 or more minutes).  Arriving late affects you and other patients whose appointments are after yours.  Also, if you miss three or more appointments without notifying the office, you may be dismissed from the clinic at the provider's discretion.      For prescription refill requests, have your pharmacy contact our office and allow 72 hours for refills to be completed.    Today you received the following chemotherapy and/or immunotherapy agents: Oxaliplatin.       To help prevent nausea and vomiting after your treatment, we encourage you to take your nausea medication as directed.  BELOW ARE SYMPTOMS THAT SHOULD BE REPORTED IMMEDIATELY: *FEVER GREATER THAN 100.4 F (38 C) OR HIGHER *CHILLS OR SWEATING *NAUSEA AND VOMITING THAT IS NOT CONTROLLED WITH YOUR NAUSEA MEDICATION *UNUSUAL SHORTNESS OF BREATH *UNUSUAL BRUISING OR BLEEDING *URINARY PROBLEMS (pain or burning when urinating, or frequent urination) *BOWEL PROBLEMS (unusual diarrhea, constipation, pain near the anus) TENDERNESS IN MOUTH AND THROAT WITH OR WITHOUT PRESENCE OF ULCERS (sore throat, sores in mouth, or a toothache) UNUSUAL RASH, SWELLING OR PAIN  UNUSUAL VAGINAL DISCHARGE OR ITCHING   Items with * indicate a potential emergency and should be followed up as soon as possible or go to the Emergency Department if any problems should occur.  Please show the CHEMOTHERAPY ALERT CARD or IMMUNOTHERAPY ALERT CARD at check-in  to the Emergency Department and triage nurse.  Should you have questions after your visit or need to cancel or reschedule your appointment, please contact [facility]ICAL [facility]COLOG[facility]  Dept: [phone]  and follow the prompts.  Office hours are 8:00 a.m. to 4:30 p.m. Monday - Friday. Please note that voicemails left after 4:00 p.m. may not be returned until the following business day.  We are closed weekends and major holidays. You have access to a nurse at all times for urgent questions. Please call the main number to the clinic Dept: [phone] and follow the prompts.   For any non-urgent questions, you may also contact your provider using MyChart. We now offer e-Visits for anyone 18 and older to request care online for non-urgent symptoms. For details visit [email or web address].   Also download the MyChart app! Go to the app store, search "MyChart", open the app, select [place], and log in with your MyChart username and password.  Due to Covid, a mask is required upon entering the hospital/clinic. If you do not have a mask, one will be given to you upon arrival. For doctor visits, patients may have 1 support person aged 18 or older with them. For treatment visits, patients cannot have anyone with them due to current Covid guidelines and our immunocompromised population.   

## 2020-08-16 ENCOUNTER — Telehealth: Payer: Self-pay | Admitting: Hematology

## 2020-08-16 NOTE — Telephone Encounter (Signed)
Scheduled follow-up appointment per 7/6 los. Patient is aware. 

## 2020-08-17 ENCOUNTER — Other Ambulatory Visit (HOSPITAL_COMMUNITY): Payer: Self-pay

## 2020-08-20 ENCOUNTER — Telehealth: Payer: Self-pay

## 2020-08-20 NOTE — Telephone Encounter (Signed)
This nurse spoke with patient who stated that on Wed 08/15/20 he was in a lot of pain with his infusion.  States that the site where the IV line was placed was very painful.  States that he still has bruising and pain from his arm all the way up to his shoulder blade.  Patient states that he wants to make sure it is documented tht he would like to receive his infusion in his right arm for the next treatment.  This nurse asked patient about a Port placement and he states do to cost and his insurance that is not an option at this time.  This nurse advised patient that this information will be forwarded to the provider.  Patient acknowledges understanding and knows to call clinic with any questions, concerns or problems.

## 2020-08-27 ENCOUNTER — Other Ambulatory Visit (HOSPITAL_COMMUNITY): Payer: Self-pay

## 2020-09-03 ENCOUNTER — Encounter: Payer: Self-pay | Admitting: Hematology

## 2020-09-03 ENCOUNTER — Other Ambulatory Visit: Payer: Self-pay

## 2020-09-03 ENCOUNTER — Inpatient Hospital Stay (HOSPITAL_BASED_OUTPATIENT_CLINIC_OR_DEPARTMENT_OTHER): Payer: No Typology Code available for payment source | Admitting: Hematology

## 2020-09-03 ENCOUNTER — Inpatient Hospital Stay: Payer: No Typology Code available for payment source

## 2020-09-03 VITALS — BP 145/93 | HR 88 | Temp 98.0°F | Resp 20 | Wt 202.9 lb

## 2020-09-03 VITALS — BP 160/96 | HR 88 | Resp 18

## 2020-09-03 DIAGNOSIS — C182 Malignant neoplasm of ascending colon: Secondary | ICD-10-CM

## 2020-09-03 DIAGNOSIS — Z5111 Encounter for antineoplastic chemotherapy: Secondary | ICD-10-CM | POA: Diagnosis not present

## 2020-09-03 LAB — CMP (CANCER CENTER ONLY)
ALT: 18 U/L (ref 0–44)
AST: 34 U/L (ref 15–41)
Albumin: 3.6 g/dL (ref 3.5–5.0)
Alkaline Phosphatase: 94 U/L (ref 38–126)
Anion gap: 7 (ref 5–15)
BUN: 12 mg/dL (ref 8–23)
CO2: 25 mmol/L (ref 22–32)
Calcium: 8.7 mg/dL — ABNORMAL LOW (ref 8.9–10.3)
Chloride: 107 mmol/L (ref 98–111)
Creatinine: 1.15 mg/dL (ref 0.61–1.24)
GFR, Estimated: >60 mL/min (ref >60)
Glucose, Bld: 116 mg/dL — ABNORMAL HIGH (ref 70–99)
Potassium: 3.6 mmol/L (ref 3.5–5.1)
Sodium: 139 mmol/L (ref 135–145)
Total Bilirubin: 0.5 mg/dL (ref 0.3–1.2)
Total Protein: 7.2 g/dL (ref 6.5–8.1)

## 2020-09-03 LAB — CBC WITH DIFFERENTIAL (CANCER CENTER ONLY)
Abs Immature Granulocytes: 0.01 10*3/uL (ref 0.00–0.07)
Basophils Absolute: 0 10*3/uL (ref 0.0–0.1)
Basophils Relative: 1 %
Eosinophils Absolute: 0.1 10*3/uL (ref 0.0–0.5)
Eosinophils Relative: 2 %
HCT: 41.2 % (ref 39.0–52.0)
Hemoglobin: 14 g/dL (ref 13.0–17.0)
Immature Granulocytes: 0 %
Lymphocytes Relative: 40 %
Lymphs Abs: 1.4 10*3/uL (ref 0.7–4.0)
MCH: 28.9 pg (ref 26.0–34.0)
MCHC: 34 g/dL (ref 30.0–36.0)
MCV: 84.9 fL (ref 80.0–100.0)
Monocytes Absolute: 0.5 10*3/uL (ref 0.1–1.0)
Monocytes Relative: 15 %
Neutro Abs: 1.5 10*3/uL — ABNORMAL LOW (ref 1.7–7.7)
Neutrophils Relative %: 42 %
Platelet Count: 242 10*3/uL (ref 150–400)
RBC: 4.85 MIL/uL (ref 4.22–5.81)
RDW: 21.7 % — ABNORMAL HIGH (ref 11.5–15.5)
WBC Count: 3.6 10*3/uL — ABNORMAL LOW (ref 4.0–10.5)
nRBC: 0 % (ref 0.0–0.2)

## 2020-09-03 LAB — CEA (IN HOUSE-CHCC): CEA (CHCC-In House): 1.71 ng/mL (ref 0.00–5.00)

## 2020-09-03 MED ORDER — PALONOSETRON HCL INJECTION 0.25 MG/5ML
INTRAVENOUS | Status: AC
  Filled 2020-09-03: qty 5

## 2020-09-03 MED ORDER — OXALIPLATIN CHEMO INJECTION 100 MG/20ML: 130.0000 mg/m2 | Freq: Once | INTRAVENOUS | Status: DC

## 2020-09-03 MED ORDER — DEXTROSE 5 % IV SOLN
Freq: Once | INTRAVENOUS | Status: AC
  Filled 2020-09-03: qty 250

## 2020-09-03 MED ORDER — PALONOSETRON HCL INJECTION 0.25 MG/5ML
0.2500 mg | Freq: Once | INTRAVENOUS | Status: AC
  Administered 2020-09-03: 0.25 mg via INTRAVENOUS

## 2020-09-03 MED ORDER — OXALIPLATIN CHEMO INJECTION 100 MG/20ML
130.0000 mg/m2 | Freq: Once | INTRAVENOUS | Status: AC
  Administered 2020-09-03: 275 mg via INTRAVENOUS
  Filled 2020-09-03: qty 55

## 2020-09-03 MED ORDER — HEPARIN SOD (PORK) LOCK FLUSH 100 UNIT/ML IV SOLN
500.0000 [IU] | Freq: Once | INTRAVENOUS | Status: DC | PRN
  Filled 2020-09-03: qty 5

## 2020-09-03 MED ORDER — CLONIDINE HCL 0.1 MG PO TABS
0.1000 mg | ORAL_TABLET | Freq: Once | ORAL | Status: AC
  Administered 2020-09-03: 0.1 mg via ORAL

## 2020-09-03 MED ORDER — CLONIDINE HCL 0.1 MG PO TABS
ORAL_TABLET | ORAL | Status: AC
  Filled 2020-09-03: qty 1

## 2020-09-03 MED ORDER — SODIUM CHLORIDE 0.9 % IV SOLN
10.0000 mg | Freq: Once | INTRAVENOUS | Status: AC
  Administered 2020-09-03: 10 mg via INTRAVENOUS
  Filled 2020-09-03: qty 10
  Filled 2020-09-03: qty 1

## 2020-09-03 MED ORDER — SODIUM CHLORIDE 0.9% FLUSH
10.0000 mL | INTRAVENOUS | Status: DC | PRN
  Filled 2020-09-03: qty 10

## 2020-09-03 NOTE — Progress Notes (Signed)
Patient was discharged and as he got up to leave he started feeling dizzy. Had patient sit back in his chair and BP elevated. He started shaking so wrapped him in warm blankets and he started feeling better. Dr. Grant Ruts was also made aware. After 20 min BP check and BP was decreasing. Continued to monitor patient. While covering my patient Roxanne Gates rechecked BP and elevated once again to clonidine was administered due to CP associated with elevated BP.

## 2020-09-03 NOTE — Addendum Note (Signed)
Addended by: Tora Kindred on: 09/03/2020 03:26 PM   Modules accepted: Orders

## 2020-09-03 NOTE — Patient Instructions (Signed)
[facility]COL[facility]  Discharge Instructions: Thank you for choosing [facility] to provide your oncology and hematology care.   If you have a lab appointment with the Cancer Center, please go directly to the Cancer Center and check in at the registration area.   Wear comfortable clothing and clothing appropriate for easy access to any Portacath or PICC line.   We strive to give you quality time with your provider. You may need to reschedule your appointment if you arrive late (15 or more minutes).  Arriving late affects you and other patients whose appointments are after yours.  Also, if you miss three or more appointments without notifying the office, you may be dismissed from the clinic at the provider's discretion.      For prescription refill requests, have your pharmacy contact our office and allow 72 hours for refills to be completed.    Today you received the following chemotherapy and/or immunotherapy agents: Oxaliplatin.       To help prevent nausea and vomiting after your treatment, we encourage you to take your nausea medication as directed.  BELOW ARE SYMPTOMS THAT SHOULD BE REPORTED IMMEDIATELY: *FEVER GREATER THAN 100.4 F (38 C) OR HIGHER *CHILLS OR SWEATING *NAUSEA AND VOMITING THAT IS NOT CONTROLLED WITH YOUR NAUSEA MEDICATION *UNUSUAL SHORTNESS OF BREATH *UNUSUAL BRUISING OR BLEEDING *URINARY PROBLEMS (pain or burning when urinating, or frequent urination) *BOWEL PROBLEMS (unusual diarrhea, constipation, pain near the anus) TENDERNESS IN MOUTH AND THROAT WITH OR WITHOUT PRESENCE OF ULCERS (sore throat, sores in mouth, or a toothache) UNUSUAL RASH, SWELLING OR PAIN  UNUSUAL VAGINAL DISCHARGE OR ITCHING   Items with * indicate a potential emergency and should be followed up as soon as possible or go to the Emergency Department if any problems should occur.  Please show the CHEMOTHERAPY ALERT CARD or IMMUNOTHERAPY ALERT CARD at check-in  to the Emergency Department and triage nurse.  Should you have questions after your visit or need to cancel or reschedule your appointment, please contact [facility]COL[facility]  Dept: [phone]  and follow the prompts.  Office hours are 8:00 a.m. to 4:30 p.m. Monday - Friday. Please note that voicemails left after 4:00 p.m. may not be returned until the following business day.  We are closed weekends and major holidays. You have access to a nurse at all times for urgent questions. Please call the main number to the clinic Dept: [phone] and follow the prompts.   For any non-urgent questions, you may also contact your provider using MyChart. We now offer e-Visits for anyone 18 and older to request care online for non-urgent symptoms. For details visit [email or web address].   Also download the MyChart app! Go to the app store, search "MyChart", open the app, select [place], and log in with your MyChart username and password.  Due to Covid, a mask is required upon entering the hospital/clinic. If you do not have a mask, one will be given to you upon arrival. For doctor visits, patients may have 1 support person aged 18 or older with them. For treatment visits, patients cannot have anyone with them due to current Covid guidelines and our immunocompromised population.   

## 2020-09-03 NOTE — Progress Notes (Signed)
[place]   [phone] [phone]) [phone]   Clinic Follow up Note   Patient Care Team: Milford Cage, PA as PCP - General (Physician Assistant) Alla Feeling, NP as Nurse Practitioner (Oncology) Jonnie Finner, RN (Inactive) as Oncology Nurse Navigator Truitt Merle, MD as Consulting Physician (Hematology and Oncology) Ileana Roup, MD as Consulting Physician (General Surgery) Koleen Distance, MD as Referring Physician (Gastroenterology)  Date of Service:  09/03/2020  CHIEF COMPLAINT: f/u of colon cancer  SUMMARY OF ONCOLOGIC HISTORY: Oncology History Overview Note  Cancer Staging Malignant neoplasm of ascending colon [facility]) Staging form: Colon and Rectum, AJCC 8th Edition - Pathologic stage from 06/13/2020: Stage IIIB (pT3, pN1b, cM0) - Signed by Alla Feeling, NP on 07/03/2020 Stage prefix: Initial diagnosis Histologic grading system: 4 grade system Histologic grade (G): G2 Lymph-vascular invasion (LVI): LVI present/identified, NOS Perineural invasion (PNI): Absent     Malignant neoplasm of ascending colon ([place])  04/17/2020 Procedure   Colonoscopy at [facility], by Dr. Twana First Shahid A mass was found in the proximal ascending colon, 1/3 circumference of the lumen, nonobstructing, ulcerated and friable multiple biopsies were performed.  A single polyp measuring 4 mm was found in the descending colon, a polypectomy was performed.  The resection was complete.  Medium size internal hemorrhoids were found.   04/17/2020 Initial Biopsy   Path : Proximal ascending colon: Invasive moderately differentiated adenocarcinoma arising in a tubular adenoma with high-grade dysplasia  Descending colon polyp: tubular adenoma   04/17/2020 Imaging   Staging CT A/P 04/17/20 at [facility]: Impression The lung bases are clear.  Focal 6 mm low-density lesion in the right lobe of the liver which does not enhance and likely represents a small cyst.  No  altered areas of attenuation are identified to suggest hepatic mass.  The spleen, pancreas, adrenal glands have a normal appearance.  There are cortical cysts in both kidneys.  Both kidneys function normally.  Large amount of gas in the colon and this post colonoscopy patient.  No obvious colonic masses.  No pneumoperitoneum.  No free fluid or abdominal fluid collections.  No lytic or blastic lesions.  Right inguinal hernia filled with fat.  Impression: Renal cyst with probable hepatic cyst without definite evidence of metastatic disease.  Right inguinal hernia.   06/04/2020 Imaging   Staging CT chest IMPRESSION: 1. No evidence of metastatic disease. 2. Hepatic steatosis. 3. Aortic atherosclerosis (ICD10-I70.0). Coronary artery calcification. 4. Enlarged pulmonary arteries, indicative of pulmonary arterial hypertension.     06/13/2020 Cancer Staging   Staging form: Colon and Rectum, AJCC 8th Edition - Pathologic stage from 06/13/2020: Stage IIIB (pT3, pN1b, cM0) - Signed by Alla Feeling, NP on 07/03/2020  Stage prefix: Initial diagnosis  Histologic grading system: 4 grade system  Histologic grade (G): G2  Lymph-vascular invasion (LVI): LVI present/identified, NOS  Perineural invasion (PNI): Absent    06/13/2020 Surgery   PROCEDURE: Laparoscopic-assisted right hemicolectomy 2. Lysis of adhesions x 90 minutes 3. Partial omentectomy 4. Bilateral transversus abdominus plane blocks SURGEON: [name], MD   06/13/2020 Pathology Results   FINAL MICROSCOPIC DIAGNOSIS:  A. COLON, RIGHT AND TERMINAL ILEUM, RESECTION:  - Invasive adenocarcinoma, moderately differentiated, spanning 3.8 cm.  - Tumor invades through muscularis propria into pericolonic tissue.  - Lymphovascular invasion present.  - Resection margins are negative.  - Metastatic carcinoma in two of nineteen lymph nodes (2/19).  - See oncology table.   B. OMENTUM, RESECTION:  -  Benign fibroadipose tissue.    pT3pN1b   IHC EXPRESSION RESULTS  TEST           RESULT  MLH1:          Preserved nuclear expression  MSH2:          Preserved nuclear expression  MSH6:          Preserved nuclear expression  PMS2:          Preserved nuclear expression    06/13/2020 Initial Diagnosis   Malignant neoplasm of ascending colon ([place])    07/23/2020 -  Chemotherapy   CAPOX q3weeks with Xeloda 1539m in the AM and 20094min the PM starting 07/23/20.        CURRENT THERAPY:  Adjuvant CAPOX q3weeks with Xeloda 150047mn the AM and 2000m69m the PM starting 07/23/20 (3 months)  INTERVAL HISTORY:  Andrew BROYHILLhere for a follow up of colon cancer. He was last seen by NP [name] on 08/15/20. He presents to the clinic alone. He reports pain to his left arm from the injection. He notes he tried cold pack and heat pack and neither worked. He denies any cold sensitivity issues. He reports diarrhea 1-2 times per day. He notes this has not changed since he started.   All other systems were reviewed with the patient and are negative.  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  ALLERGIES:  has No Known Allergies.  MEDICATIONS:  Current Outpatient Medications  Medication Sig Dispense Refill   capecitabine (XELODA) 500 MG tablet Take 3 tabs in morning and 4 tabs in evening for 14 days then off 7 days. Repeat every 21 days. Take within 30 minutes after meals. 98 tablet 1   Cholecalciferol  (VITAMIN D) 50 MCG (2000 UT) tablet Take 2,000 Units by mouth daily.     diphenhydrAMINE HCl, Sleep, 50 MG CAPS Take 100 mg by mouth at bedtime.     ondansetron (ZOFRAN) 8 MG tablet Take 1 tablet (8 mg total) by mouth 2 (two) times daily as needed for refractory nausea / vomiting. Start on day 3 after chemotherapy. 30 tablet 1   prochlorperazine (COMPAZINE) 10 MG tablet Take 1 tablet (10 mg total) by mouth every 6 (six) hours as needed (Nausea or vomiting). 30 tablet 1   sildenafil (VIAGRA) 100 MG tablet Take 100 mg by mouth daily as needed for erectile dysfunction.     Tetrahydrozoline HCl (VISINE OP) Place 1 drop into both eyes daily.     No current facility-administered medications for this visit.   Facility-Administered Medications Ordered in Other Visits  Medication Dose Route Frequency Provider Last Rate Last Admin   heparin lock flush 100 unit/mL  500 Units Intracatheter Once PRN FengTruitt Merle       sodium chloride flush (NS) 0.9 % injection 10 mL  10 mL Intracatheter PRN FengTruitt Merle  PHYSICAL EXAMINATION: ECOG PERFORMANCE STATUS: 1 - Symptomatic but completely ambulatory  Vitals:   09/03/20 0933  BP: (!) 145/93  Pulse: 88  Resp: 20  Temp: 98 F (36.7 C)  SpO2: 100%   Filed Weights   09/03/20 0933  Weight: 202 lb 14.4 oz (92 kg)    Due to COVID19 we will limit examination to appearance. Patient had no complaints.  GENERAL:alert, no distress and comfortable SKIN: skin color normal, no rashes or significant lesions EYES: normal, Conjunctiva are pink and non-injected, sclera clear  NEURO: alert & oriented x 3 with fluent speech  LABORATORY DATA:  I have reviewed the data as listed CBC Latest Ref Rng & Units 09/03/2020 08/15/2020 07/23/2020  WBC 4.0 - 10.5 K/uL 3.6(L) 4.2 5.1  Hemoglobin 13.0 - 17.0 g/dL 14.0 13.1 12.4(L)  Hematocrit 39.0 - 52.0 % 41.2 39.9 38.8(L)  Platelets 150 - 400 K/uL 242 291 298     CMP Latest Ref Rng & Units 09/03/2020 08/15/2020 07/23/2020   Glucose 70 - 99 mg/dL 116(H) 107(H) 99  BUN 8 - 23 mg/dL _0 Creatinine 0.61 - 1.24 mg/dL 1.15 1.12 1.16  Sodium 135 - 145 mmol/L 139 139 141  Potassium 3.5 - 5.1 mmol/L 3.6 4.1 3.8  Chloride 98 - 111 mmol/L 107 108 108  CO2 22 - 32 mmol/L _1 Calcium 8.9 - 10.3 mg/dL 8.7(L) 8.7(L) 9.3  Total Protein 6.5 - 8.1 g/dL 7.2 7.1 7.7  Total Bilirubin 0.3 - 1.2 mg/dL 0.5 0.4 0.3  Alkaline Phos 38 - 126 U/L 94 85 85  AST 15 - 41 U/L 34 23 33  ALT 0 - 44 U/L _2 RADIOGRAPHIC STUDIES: I have personally reviewed the radiological images as listed and agreed with the findings in the report. No results found.   ASSESSMENT & PLAN:  JACOREY DONAWAY is a 60 y.o. male with   1.  Invasive adenocarcinoma of the ascending colon, grade 2, pT3pN1bM0 stage IIIB, MMR normal -He presented with 1 month history of bloody stools, found to have invasive adenocarcinoma in the ascending colon on first colonoscopy. Staging work-up showed renal and liver cysts, otherwise negative for distant metastatic disease. Pre-op CEA not done. -He underwent right hemicolectomy on 06/13/20 with Dr. Dema Severin, path showed grade 2 adenocarcinoma invading through the muscularis propria, LVI+, metastatic carcinoma in 2 of 19 lymph nodes.  Margins were clear. There was no perineural invasion or perforation. MMR is normal -To reduce his recurrence risk he started adjuvant CAPOX every 3 weeks with Xeloda 1500 mg AM/2000 mg p.m. for 2 weeks on/1 week off starting 07/23/20 for 3 months. -We discussed possibly placing a port due to his arm pain. He would like to hold off for now. -labs reviewed, overall adequate to proceed with C3 CAPOX.   2.  Genetics -Due to his personal colon cancer and family history of colon and prostate cancers, he qualifies for genetics. He is interested and agreeable. Will refer him  -his daughter's mother had breast cancer, daughter is reportedly negative but unclear if just BRCA or full genetics  panel. -patient's 3 children understand their risks and are agreeable to cancer screening. Pt also has siblings who will need screening and possible genetic testing.   3.  Health promotion/disease prevention -He smoked cigarettes for 5 years, quit in March 25, 1991 after his father died of lung cancer. He knows to abstain from smoking -He has had gin/tonic drink daily  x25 years. I recommend to stop alcohol during chemo, and cut back significantly to reduce risk of colon cancer recurrence and developing new cancers in the future. He is agreeable -he is UTD on covid-19 vaccines   PLAN: -proceed with C3 CAPOX today -continue Xeloda, starting C3 today -labs, f/u, and C4 CAPOX in 3 weeks   No problem-specific Assessment & Plan notes found for this encounter.   Orders Placed This Encounter  Procedures   Ambulatory referral to Genetics    Referral Priority:   Routine    Referral Type:   Consultation    Referral Reason:   Specialty Services Required    Number of Visits Requested:   1    All questions were answered. The patient knows to call the clinic with any problems, questions or concerns. No barriers to learning was detected. The total time spent in the appointment was 30 minutes.     Truitt Merle, MD 09/03/2020   I, Wilburn Mylar, am acting as scribe for Truitt Merle, MD.   I have reviewed the above documentation for accuracy and completeness, and I agree with the above.   Addendum Mr. Suen finished oxaliplatin infusion this afternoon, went to bathroom, became dizzy and developed some chest tightness.  Vital signs showed blood pressure 160/102, pulse 84 pulse ox 99% on room air.  No sweating, shortness of breath or others symptoms.  We gave him a 1 blanket, he did feel little better, but repeated blood pressure was still high, I will give 1 dose of clonidine 0.1 mg, and a cup of warm chocolate drink, his symptom resolved afterwards, and was discharged home in stable condition. This is likely  related to oxaliplatin.  We will do 3 hours infusion next time. With more premeds.   Truitt Merle  09/03/2020

## 2020-09-03 NOTE — Addendum Note (Signed)
Addended by: Wylene Men on: 09/03/2020 03:25 PM   Modules accepted: Orders

## 2020-09-03 NOTE — Progress Notes (Signed)
BP improving. Patient stated that he is feeling better even after ambulating. Received an okay to DC from Dr. Burr Medico. Took patient outside in a wheelchair where he was calling an uber.

## 2020-09-04 ENCOUNTER — Telehealth: Payer: Self-pay | Admitting: Hematology

## 2020-09-04 NOTE — Telephone Encounter (Addendum)
Scheduled follow-up appointment per 7/25 los. Patient is aware and cancelled upcoming genetic appointment. Patient stated he did not want to reschedule genetic appointment.

## 2020-09-04 NOTE — Telephone Encounter (Signed)
Scheduled appts per 7/26 sch msg. Called pt, no answer. Left msg with appts date and times.

## 2020-09-04 NOTE — Telephone Encounter (Signed)
Scheduled follow-up appointment per 7/25 los. Patient is aware. 

## 2020-09-06 ENCOUNTER — Other Ambulatory Visit (HOSPITAL_COMMUNITY): Payer: Self-pay

## 2020-09-11 ENCOUNTER — Encounter: Payer: Self-pay | Admitting: Hematology

## 2020-09-11 NOTE — Progress Notes (Signed)
This nurse received a message from scheduling stating that patient called and canceled his appointment for Genetic Testing on 8/4.  Patient refused the reschedule,  feels that he does not need genetic testing at this time.  MD made aware.

## 2020-09-13 ENCOUNTER — Encounter: Payer: No Typology Code available for payment source | Admitting: Genetic Counselor

## 2020-09-13 ENCOUNTER — Other Ambulatory Visit: Payer: No Typology Code available for payment source

## 2020-09-24 ENCOUNTER — Inpatient Hospital Stay: Payer: No Typology Code available for payment source

## 2020-09-24 ENCOUNTER — Inpatient Hospital Stay: Payer: No Typology Code available for payment source | Attending: Hematology | Admitting: Hematology

## 2020-09-24 ENCOUNTER — Other Ambulatory Visit (HOSPITAL_COMMUNITY): Payer: Self-pay

## 2020-09-24 ENCOUNTER — Other Ambulatory Visit: Payer: Self-pay

## 2020-09-24 ENCOUNTER — Encounter: Payer: Self-pay | Admitting: Hematology

## 2020-09-24 VITALS — BP 155/94 | HR 73 | Resp 20

## 2020-09-24 DIAGNOSIS — M79602 Pain in left arm: Secondary | ICD-10-CM | POA: Diagnosis not present

## 2020-09-24 DIAGNOSIS — Z86010 Personal history of colonic polyps: Secondary | ICD-10-CM | POA: Insufficient documentation

## 2020-09-24 DIAGNOSIS — N281 Cyst of kidney, acquired: Secondary | ICD-10-CM | POA: Insufficient documentation

## 2020-09-24 DIAGNOSIS — Z5111 Encounter for antineoplastic chemotherapy: Secondary | ICD-10-CM | POA: Diagnosis not present

## 2020-09-24 DIAGNOSIS — C182 Malignant neoplasm of ascending colon: Secondary | ICD-10-CM

## 2020-09-24 DIAGNOSIS — K760 Fatty (change of) liver, not elsewhere classified: Secondary | ICD-10-CM | POA: Diagnosis not present

## 2020-09-24 DIAGNOSIS — I10 Essential (primary) hypertension: Secondary | ICD-10-CM | POA: Insufficient documentation

## 2020-09-24 DIAGNOSIS — R2232 Localized swelling, mass and lump, left upper limb: Secondary | ICD-10-CM | POA: Insufficient documentation

## 2020-09-24 DIAGNOSIS — K4090 Unilateral inguinal hernia, without obstruction or gangrene, not specified as recurrent: Secondary | ICD-10-CM | POA: Insufficient documentation

## 2020-09-24 DIAGNOSIS — F17210 Nicotine dependence, cigarettes, uncomplicated: Secondary | ICD-10-CM | POA: Diagnosis not present

## 2020-09-24 DIAGNOSIS — I700 Atherosclerosis of aorta: Secondary | ICD-10-CM | POA: Diagnosis not present

## 2020-09-24 LAB — CBC WITH DIFFERENTIAL (CANCER CENTER ONLY)
Abs Immature Granulocytes: 0.01 10*3/uL (ref 0.00–0.07)
Basophils Absolute: 0 10*3/uL (ref 0.0–0.1)
Basophils Relative: 1 %
Eosinophils Absolute: 0.1 10*3/uL (ref 0.0–0.5)
Eosinophils Relative: 2 %
HCT: 40.2 % (ref 39.0–52.0)
Hemoglobin: 13.8 g/dL (ref 13.0–17.0)
Immature Granulocytes: 0 %
Lymphocytes Relative: 42 %
Lymphs Abs: 1.6 10*3/uL (ref 0.7–4.0)
MCH: 29.9 pg (ref 26.0–34.0)
MCHC: 34.3 g/dL (ref 30.0–36.0)
MCV: 87.2 fL (ref 80.0–100.0)
Monocytes Absolute: 0.7 10*3/uL (ref 0.1–1.0)
Monocytes Relative: 18 %
Neutro Abs: 1.4 10*3/uL — ABNORMAL LOW (ref 1.7–7.7)
Neutrophils Relative %: 37 %
Platelet Count: 198 10*3/uL (ref 150–400)
RBC: 4.61 MIL/uL (ref 4.22–5.81)
RDW: 24.6 % — ABNORMAL HIGH (ref 11.5–15.5)
WBC Count: 3.8 10*3/uL — ABNORMAL LOW (ref 4.0–10.5)
nRBC: 0 % (ref 0.0–0.2)

## 2020-09-24 LAB — CMP (CANCER CENTER ONLY)
ALT: 18 U/L (ref 0–44)
AST: 29 U/L (ref 15–41)
Albumin: 3.5 g/dL (ref 3.5–5.0)
Alkaline Phosphatase: 100 U/L (ref 38–126)
Anion gap: 11 (ref 5–15)
BUN: 12 mg/dL (ref 8–23)
CO2: 21 mmol/L — ABNORMAL LOW (ref 22–32)
Calcium: 9 mg/dL (ref 8.9–10.3)
Chloride: 109 mmol/L (ref 98–111)
Creatinine: 1.09 mg/dL (ref 0.61–1.24)
GFR, Estimated: >60 mL/min (ref >60)
Glucose, Bld: 101 mg/dL — ABNORMAL HIGH (ref 70–99)
Potassium: 4 mmol/L (ref 3.5–5.1)
Sodium: 141 mmol/L (ref 135–145)
Total Bilirubin: 0.5 mg/dL (ref 0.3–1.2)
Total Protein: 6.9 g/dL (ref 6.5–8.1)

## 2020-09-24 MED ORDER — SODIUM CHLORIDE 0.9 % IV SOLN
10.0000 mg | Freq: Once | INTRAVENOUS | Status: AC
  Administered 2020-09-24: 10 mg via INTRAVENOUS
  Filled 2020-09-24: qty 10

## 2020-09-24 MED ORDER — PALONOSETRON HCL INJECTION 0.25 MG/5ML
0.2500 mg | Freq: Once | INTRAVENOUS | Status: AC
  Administered 2020-09-24: 0.25 mg via INTRAVENOUS
  Filled 2020-09-24: qty 5

## 2020-09-24 MED ORDER — CAPECITABINE 500 MG PO TABS
ORAL_TABLET | ORAL | 0 refills | Status: DC
  Filled 2020-09-24: qty 84, fill #0

## 2020-09-24 MED ORDER — DEXTROSE 5 % IV SOLN: Freq: Once | INTRAVENOUS | Status: AC

## 2020-09-24 MED ORDER — OXALIPLATIN CHEMO INJECTION 100 MG/20ML
130.0000 mg/m2 | Freq: Once | INTRAVENOUS | Status: AC
  Administered 2020-09-24: 275 mg via INTRAVENOUS
  Filled 2020-09-24: qty 55

## 2020-09-24 NOTE — Patient Instructions (Signed)
[facility]  Discharge Instructions: ?Thank you for choosing [facility] to provide your oncology and hematology care.  ? ?If you have a lab appointment with the Cancer Center, please go directly to the Cancer Center and check in at the registration area. ?  ?Wear comfortable clothing and clothing appropriate for easy access to any Portacath or PICC line.  ? ?We strive to give you quality time with your provider. You may need to reschedule your appointment if you arrive late (15 or more minutes).  Arriving late affects you and other patients whose appointments are after yours.  Also, if you miss three or more appointments without notifying the office, you may be dismissed from the clinic at the provider?s discretion.    ?  ?For prescription refill requests, have your pharmacy contact our office and allow 72 hours for refills to be completed.   ? ?Today you received the following chemotherapy and/or immunotherapy agent: Oxaliplatin ?  ?To help prevent nausea and vomiting after your treatment, we encourage you to take your nausea medication as directed. ? ?BELOW ARE SYMPTOMS THAT SHOULD BE REPORTED IMMEDIATELY: ?*FEVER GREATER THAN 100.4 F (38 ?C) OR HIGHER ?*CHILLS OR SWEATING ?*NAUSEA AND VOMITING THAT IS NOT CONTROLLED WITH YOUR NAUSEA MEDICATION ?*UNUSUAL SHORTNESS OF BREATH ?*UNUSUAL BRUISING OR BLEEDING ?*URINARY PROBLEMS (pain or burning when urinating, or frequent urination) ?*BOWEL PROBLEMS (unusual diarrhea, constipation, pain near the anus) ?TENDERNESS IN MOUTH AND THROAT WITH OR WITHOUT PRESENCE OF ULCERS (sore throat, sores in mouth, or a toothache) ?UNUSUAL RASH, SWELLING OR PAIN  ?UNUSUAL VAGINAL DISCHARGE OR ITCHING  ? ?Items with * indicate a potential emergency and should be followed up as soon as possible or go to the Emergency Department if any problems should occur. ? ?Please show the CHEMOTHERAPY ALERT CARD or IMMUNOTHERAPY ALERT CARD at check-in to  the Emergency Department and triage nurse. ? ?Should you have questions after your visit or need to cancel or reschedule your appointment, please contact [facility]  Dept: [phone]  and follow the prompts.  Office hours are 8:00 a.m. to 4:30 p.m. Monday - Friday. Please note that voicemails left after 4:00 p.m. may not be returned until the following business day.  We are closed weekends and major holidays. You have access to a nurse at all times for urgent questions. Please call the main number to the clinic Dept: [phone] and follow the prompts. ? ? ?For any non-urgent questions, you may also contact your provider using MyChart. We now offer e-Visits for anyone 18 and older to request care online for non-urgent symptoms. For details visit [email or web address]. ?  ?Also download the MyChart app! Go to the app store, search "MyChart", open the app, select [place], and log in with your MyChart username and password. ? ?Due to Covid, a mask is required upon entering the hospital/clinic. If you do not have a mask, one will be given to you upon arrival. For doctor visits, patients may have 1 support person aged 18 or older with them. For treatment visits, patients cannot have anyone with them due to current Covid guidelines and our immunocompromised population.  ?

## 2020-09-24 NOTE — Progress Notes (Signed)
Per Dr. Burr Medico, ok for treatment today with Onset 1.4

## 2020-09-24 NOTE — Progress Notes (Signed)
[place]   Telephone:(336) [phone] Fax:(336) [phone]   Clinic Follow up Note   Patient Care Team: Milford Cage, PA as PCP - General (Physician Assistant) Alla Feeling, NP as Nurse Practitioner (Oncology) Jonnie Finner, RN (Inactive) as Oncology Nurse Navigator Truitt Merle, MD as Consulting Physician (Hematology and Oncology) Ileana Roup, MD as Consulting Physician (General Surgery) Koleen Distance, MD as Referring Physician (Gastroenterology)  Date of Service:  09/24/2020  CHIEF COMPLAINT: f/u of colon cancer  SUMMARY OF ONCOLOGIC HISTORY: Oncology History Overview Note  Cancer Staging Malignant neoplasm of ascending colon [facility]) Staging form: Colon and Rectum, AJCC 8th Edition - Pathologic stage from 06/13/2020: Stage IIIB (pT3, pN1b, cM0) - Signed by Alla Feeling, NP on 07/03/2020 Stage prefix: Initial diagnosis Histologic grading system: 4 grade system Histologic grade (G): G2 Lymph-vascular invasion (LVI): LVI present/identified, NOS Perineural invasion (PNI): Absent    Malignant neoplasm of ascending colon ([place])  04/17/2020 Procedure   Colonoscopy at [facility], by Dr. Twana First Shahid A mass was found in the proximal ascending colon, 1/3 circumference of the lumen, nonobstructing, ulcerated and friable multiple biopsies were performed.  A single polyp measuring 4 mm was found in the descending colon, a polypectomy was performed.  The resection was complete.  Medium size internal hemorrhoids were found.   04/17/2020 Initial Biopsy   Path : Proximal ascending colon: Invasive moderately differentiated adenocarcinoma arising in a tubular adenoma with high-grade dysplasia  Descending colon polyp: tubular adenoma   04/17/2020 Imaging   Staging CT A/P 04/17/20 at [facility]: Impression The lung bases are clear.  Focal 6 mm low-density lesion in the right lobe of the liver which does not enhance and likely represents a small cyst.  No  altered areas of attenuation are identified to suggest hepatic mass.  The spleen, pancreas, adrenal glands have a normal appearance.  There are cortical cysts in both kidneys.  Both kidneys function normally.  Large amount of gas in the colon and this post colonoscopy patient.  No obvious colonic masses.  No pneumoperitoneum.  No free fluid or abdominal fluid collections.  No lytic or blastic lesions.  Right inguinal hernia filled with fat.  Impression: Renal cyst with probable hepatic cyst without definite evidence of metastatic disease.  Right inguinal hernia.   06/04/2020 Imaging   Staging CT chest IMPRESSION: 1. No evidence of metastatic disease. 2. Hepatic steatosis. 3. Aortic atherosclerosis (ICD10-I70.0). Coronary artery calcification. 4. Enlarged pulmonary arteries, indicative of pulmonary arterial hypertension.     06/13/2020 Cancer Staging   Staging form: Colon and Rectum, AJCC 8th Edition - Pathologic stage from 06/13/2020: Stage IIIB (pT3, pN1b, cM0) - Signed by Alla Feeling, NP on 07/03/2020 Stage prefix: Initial diagnosis Histologic grading system: 4 grade system Histologic grade (G): G2 Lymph-vascular invasion (LVI): LVI present/identified, NOS Perineural invasion (PNI): Absent   06/13/2020 Surgery   PROCEDURE: Laparoscopic-assisted right hemicolectomy 2. Lysis of adhesions x 90 minutes 3. Partial omentectomy 4. Bilateral transversus abdominus plane blocks SURGEON: [name], MD   06/13/2020 Pathology Results   FINAL MICROSCOPIC DIAGNOSIS:  A. COLON, RIGHT AND TERMINAL ILEUM, RESECTION:  - Invasive adenocarcinoma, moderately differentiated, spanning 3.8 cm.  - Tumor invades through muscularis propria into pericolonic tissue.  - Lymphovascular invasion present.  - Resection margins are negative.  - Metastatic carcinoma in two of nineteen lymph nodes (2/19).  - See oncology table.   B. OMENTUM, RESECTION:  - Benign fibroadipose tissue.   pT3pN1b  IHC  EXPRESSION RESULTS  TEST           RESULT  MLH1:          Preserved nuclear expression  MSH2:          Preserved nuclear expression  MSH6:          Preserved nuclear expression  PMS2:          Preserved nuclear expression    06/13/2020 Initial Diagnosis   Malignant neoplasm of ascending colon ([place])   07/23/2020 -  Chemotherapy   CAPOX q3weeks with Xeloda 152m in the AM and 20031min the PM starting 07/23/20.        CURRENT THERAPY:  Adjuvant CAPOX q3weeks with Xeloda 150040mn AM and 2000m89m PM starting 07/23/20 (3 months)  INTERVAL HISTORY:  Andrew PASCUAhere for a follow up of colon cancer. He was last seen by me on 09/03/20. He presents to the clinic alone. He reports a continued, painful knot on his left arm from his first cycle of chemo. He notes his right arm has been fine. He reports he tolerates treatment well and feels back to normal the Wednesday of his week off. He notes a history of HTN, which resolved after he began going to the gym. He notes he hasn't gone to the gym for about the last year. He notes he does not have an appointment with a PCP.   All other systems were reviewed with the patient and are negative.  MEDICAL HISTORY:  Past Medical History:  Diagnosis Date   Cancer (HCC)[place] colon cancer, basal cell    Pneumonia    2020     SURGICAL HISTORY: Past Surgical History:  Procedure Laterality Date   APPENDECTOMY     LAPAROSCOPIC RIGHT HEMI COLECTOMY N/A 06/13/2020   Procedure: LAPAROSCOPIC RIGHT HEMI COLECTOMY AND PARTIAL OMENTECTOMY WITH LYSIS OF ADHESIONS;  Surgeon: [name];  Location: [facility];  Service: General;  Laterality: N/A;    I have reviewed the social history and family history with the patient and they are unchanged from previous note.  ALLERGIES:  has No Known Allergies.  MEDICATIONS:  Current Outpatient Medications  Medication Sig Dispense Refill   capecitabine (XELODA) 500 MG tablet Take 3 tabs in morning and 4 tabs  in evening for 14 days then off 7 days. Repeat every 21 days. Take within 30 minutes after meals. 84 tablet 0   Cholecalciferol (VITAMIN D) 50 MCG (2000 UT) tablet Take 2,000 Units by mouth daily.     diphenhydrAMINE HCl, Sleep, 50 MG CAPS Take 100 mg by mouth at bedtime.     ondansetron (ZOFRAN) 8 MG tablet Take 1 tablet (8 mg total) by mouth 2 (two) times daily as needed for refractory nausea / vomiting. Start on day 3 after chemotherapy. 30 tablet 1   prochlorperazine (COMPAZINE) 10 MG tablet Take 1 tablet (10 mg total) by mouth every 6 (six) hours as needed (Nausea or vomiting). 30 tablet 1   sildenafil (VIAGRA) 100 MG tablet Take 100 mg by mouth daily as needed for erectile dysfunction.     Tetrahydrozoline HCl (VISINE OP) Place 1 drop into both eyes daily.     No current facility-administered medications for this visit.    PHYSICAL EXAMINATION: ECOG PERFORMANCE STATUS: 1 - Symptomatic but completely ambulatory  Vitals:   09/24/20 0920  BP: (!) 147/97  Pulse: 80  Resp: 18  Temp: 98.4 F (36.9 C)  SpO2: 98%   Wt Readings from Last 3 Encounters:  09/24/20 201 lb 14.4 oz (91.6 kg)  09/03/20 202 lb 14.4 oz (92 kg)  08/15/20 202 lb 6.4 oz (91.8 kg)     GENERAL:alert, no distress and comfortable SKIN: skin color normal, no rashes or significant lesions EYES: normal, Conjunctiva are pink and non-injected, sclera clear  NEURO: alert & oriented x 3 with fluent speech  LABORATORY DATA:  I have reviewed the data as listed CBC Latest Ref Rng & Units 09/24/2020 09/03/2020 08/15/2020  WBC 4.0 - 10.5 K/uL 3.8(L) 3.6(L) 4.2  Hemoglobin 13.0 - 17.0 g/dL 13.8 14.0 13.1  Hematocrit 39.0 - 52.0 % 40.2 41.2 39.9  Platelets 150 - 400 K/uL 198 242 291     CMP Latest Ref Rng & Units 09/24/2020 09/03/2020 08/15/2020  Glucose 70 - 99 mg/dL 101(H) 116(H) 107(H)  BUN 8 - 23 mg/dL 12 12 11   Creatinine 0.61 - 1.24 mg/dL 1.09 1.15 1.12  Sodium 135 - 145 mmol/L 141 139 139  Potassium 3.5 - 5.1 mmol/L  4.0 3.6 4.1  Chloride 98 - 111 mmol/L 109 107 108  CO2 22 - 32 mmol/L 21(L) 25 24  Calcium 8.9 - 10.3 mg/dL 9.0 8.7(L) 8.7(L)  Total Protein 6.5 - 8.1 g/dL 6.9 7.2 7.1  Total Bilirubin 0.3 - 1.2 mg/dL 0.5 0.5 0.4  Alkaline Phos 38 - 126 U/L 100 94 85  AST 15 - 41 U/L 29 34 23  ALT 0 - 44 U/L 18 18 12       RADIOGRAPHIC STUDIES: I have personally reviewed the radiological images as listed and agreed with the findings in the report. No results found.   ASSESSMENT & PLAN:  Andrew Hines is a 60 y.o. male with   1.  Invasive adenocarcinoma of the ascending colon, grade 2, pT3pN1bM0 stage IIIB, MMR normal -He presented with 1 month history of bloody stools, found to have invasive adenocarcinoma in the ascending colon on first colonoscopy. Staging work-up showed renal and liver cysts, otherwise negative for distant metastatic disease. Pre-op CEA not done. -He underwent right hemicolectomy on 06/13/20 with Dr. Dema Severin, path showed grade 2 adenocarcinoma invading through the muscularis propria, LVI+, metastatic carcinoma in 2 of 19 lymph nodes.  Margins were clear. There was no perineural invasion or perforation. MMR is normal -To reduce his recurrence risk he started adjuvant CAPOX every 3 weeks with Xeloda 1500 mg AM/2000 mg p.m. for 2 weeks on/1 week off starting 07/23/20 for 3 months. He has been tolerating chemo well  -labs reviewed, overall adequate to proceed with his final cycle of CAPOX. --I discussed the risk of cancer recurrence in the future. I discussed the surveillance plan, which is a physical exam and lab test (including CBC, CMP and CEA) every 3 months for the first 2 years, then every 6-12 months, colonoscopy in one year, and surveilliance CT scan every 6-12 month for up to 5 year.  -f/u in 2 months, plan to repeat scan in 06/2021   2.  Genetics -Due to his personal colon cancer and family history of colon and prostate cancers, he qualifies for genetics. He declines at this  time. -his daughter's mother had breast cancer, daughter is reportedly negative but unclear if just BRCA or full genetics panel. -patient's 3 children understand their risks and are agreeable to cancer screening. Pt also has siblings who will need screening.   3.  Health promotion/disease prevention -He smoked cigarettes for 5 years, quit in 1993  after his father died of lung cancer. He knows to abstain from smoking -He has had gin/tonic drink daily x25 years. I recommend to stop alcohol during chemo, and cut back significantly to reduce risk of colon cancer recurrence and developing new cancers in the future. He is agreeable -he is UTD on covid-19 vaccines -He notes a history of HTN but has not required medication since improvement with lifestyle changes. I recommend he watch his sodium intake. I also advised him to buy a home monitor to see if his BP is only high at the cancer center. -He notes his daughter requested he and his wife get the Tdap vaccine for her child.     PLAN: -proceed with C4 CAPOX today, final cycle (will cancel his next scheduled treatment) -continue Xeloda for one more cycle at same dose, I refilled today  -labs and f/u in 2 months   No problem-specific Assessment & Plan notes found for this encounter.   No orders of the defined types were placed in this encounter.  All questions were answered. The patient knows to call the clinic with any problems, questions or concerns. No barriers to learning was detected. The total time spent in the appointment was 30 minutes.     Truitt Merle, MD 09/24/2020   I, Wilburn Mylar, am acting as scribe for Truitt Merle, MD.   I have reviewed the above documentation for accuracy and completeness, and I agree with the above.

## 2020-09-25 ENCOUNTER — Other Ambulatory Visit (HOSPITAL_COMMUNITY): Payer: Self-pay

## 2020-09-26 ENCOUNTER — Other Ambulatory Visit (HOSPITAL_COMMUNITY): Payer: Self-pay

## 2020-10-03 ENCOUNTER — Other Ambulatory Visit (HOSPITAL_COMMUNITY): Payer: Self-pay

## 2020-10-16 ENCOUNTER — Ambulatory Visit: Payer: No Typology Code available for payment source

## 2020-10-16 ENCOUNTER — Other Ambulatory Visit: Payer: No Typology Code available for payment source

## 2020-10-16 ENCOUNTER — Ambulatory Visit: Payer: No Typology Code available for payment source | Admitting: Nurse Practitioner

## 2020-10-23 ENCOUNTER — Other Ambulatory Visit (HOSPITAL_COMMUNITY): Payer: Self-pay

## 2020-11-07 ENCOUNTER — Other Ambulatory Visit (HOSPITAL_COMMUNITY): Payer: Self-pay

## 2020-11-20 ENCOUNTER — Other Ambulatory Visit (HOSPITAL_COMMUNITY): Payer: Self-pay

## 2020-11-21 NOTE — Progress Notes (Deleted)
[place]   Telephone:(336) [phone] Fax:(336) [phone]   Clinic Follow up Note   Patient Care Team: Andrew Cage, PA as PCP - General (Physician Assistant) Andrew Feeling, NP as Nurse Practitioner (Oncology) Andrew Finner, RN (Inactive) as Oncology Nurse Navigator Andrew Merle, MD as Consulting Physician (Hematology and Oncology) Andrew Roup, MD as Consulting Physician (General Surgery) Andrew Distance, MD as Referring Physician (Gastroenterology) 11/21/2020  CHIEF COMPLAINT: Follow-up colon cancer  SUMMARY OF ONCOLOGIC HISTORY: Oncology History Overview Note  Cancer Staging Malignant neoplasm of ascending colon [facility]) Staging form: Colon and Rectum, AJCC 8th Edition - Pathologic stage from 06/13/2020: Stage IIIB (pT3, pN1b, cM0) - Signed by Andrew Feeling, NP on 07/03/2020 Stage prefix: Initial diagnosis Histologic grading system: 4 grade system Histologic grade (G): G2 Lymph-vascular invasion (LVI): LVI present/identified, NOS Perineural invasion (PNI): Absent    Malignant neoplasm of ascending colon ([place])  04/17/2020 Procedure   Colonoscopy at [facility], by Dr. Twana First Hines A mass was found in the proximal ascending colon, 1/3 circumference of the lumen, nonobstructing, ulcerated and friable multiple biopsies were performed.  A single polyp measuring 4 mm was found in the descending colon, a polypectomy was performed.  The resection was complete.  Medium size internal hemorrhoids were found.   04/17/2020 Initial Biopsy   Path : Proximal ascending colon: Invasive moderately differentiated adenocarcinoma arising in a tubular adenoma with high-grade dysplasia  Descending colon polyp: tubular adenoma   04/17/2020 Imaging   Staging CT A/P 04/17/20 at [facility]: Impression The lung bases are clear.  Focal 6 mm low-density lesion in the right lobe of the liver which does not enhance and likely represents a small cyst.  No altered areas of  attenuation are identified to suggest hepatic mass.  The spleen, pancreas, adrenal glands have a normal appearance.  There are cortical cysts in both kidneys.  Both kidneys function normally.  Large amount of gas in the colon and this post colonoscopy patient.  No obvious colonic masses.  No pneumoperitoneum.  No free fluid or abdominal fluid collections.  No lytic or blastic lesions.  Right inguinal hernia filled with fat.  Impression: Renal cyst with probable hepatic cyst without definite evidence of metastatic disease.  Right inguinal hernia.   06/04/2020 Imaging   Staging CT chest IMPRESSION: 1. No evidence of metastatic disease. 2. Hepatic steatosis. 3. Aortic atherosclerosis (ICD10-I70.0). Coronary artery calcification. 4. Enlarged pulmonary arteries, indicative of pulmonary arterial hypertension.     06/13/2020 Cancer Staging   Staging form: Colon and Rectum, AJCC 8th Edition - Pathologic stage from 06/13/2020: Stage IIIB (pT3, pN1b, cM0) - Signed by Andrew Feeling, NP on 07/03/2020 Stage prefix: Initial diagnosis Histologic grading system: 4 grade system Histologic grade (G): G2 Lymph-vascular invasion (LVI): LVI present/identified, NOS Perineural invasion (PNI): Absent   06/13/2020 Surgery   PROCEDURE: Laparoscopic-assisted right hemicolectomy 2. Lysis of adhesions x 90 minutes 3. Partial omentectomy 4. Bilateral transversus abdominus plane blocks SURGEON: Andrew Mt. White, MD   06/13/2020 Pathology Results   FINAL MICROSCOPIC DIAGNOSIS:  A. COLON, RIGHT AND TERMINAL ILEUM, RESECTION:  - Invasive adenocarcinoma, moderately differentiated, spanning 3.8 cm.  - Tumor invades through muscularis propria into pericolonic tissue.  - Lymphovascular invasion present.  - Resection margins are negative.  - Metastatic carcinoma in two of nineteen lymph nodes (2/19).  - See oncology table.   B. OMENTUM, RESECTION:  - Benign fibroadipose tissue.   pT3pN1b   IHC EXPRESSION  RESULTS  TEST           RESULT  MLH1:          Preserved nuclear expression  MSH2:          Preserved nuclear expression  MSH6:          Preserved nuclear expression  PMS2:          Preserved nuclear expression    06/13/2020 Initial Diagnosis   Malignant neoplasm of ascending colon ([place])   07/23/2020 -  Chemotherapy   CAPOX q3weeks with Xeloda 1522m in the AM and 20053min the PM starting 07/23/20.       CURRENT THERAPY: Completed 4 cycles adjuvant Capox 07/23/2020 - 09/24/2020, last cycle 5 was Xeloda alone, on surveillance  INTERVAL HISTORY: Andrew Hines for follow-up as scheduled.  Last seen by Andrew Hines/15/2022 for final cycle of adjuvant chemo (CAPOX).    REVIEW OF SYSTEMS:   Constitutional: Denies fevers, chills or abnormal weight loss Eyes: Denies blurriness of vision Ears, nose, mouth, throat, and face: Denies mucositis or sore throat Respiratory: Denies cough, dyspnea or wheezes Cardiovascular: Denies palpitation, chest discomfort or lower extremity swelling Gastrointestinal:  Denies nausea, heartburn or change in bowel habits Skin: Denies abnormal skin rashes Lymphatics: Denies new lymphadenopathy or easy bruising Neurological:Denies numbness, tingling or new weaknesses Behavioral/Psych: Mood is stable, no new changes  All other systems were reviewed with the patient and are negative.  MEDICAL HISTORY:  Past Medical History:  Diagnosis Date   [place] cancer, basal cell    Pneumonia    2020     SURGICAL HISTORY: Past Surgical History:  Procedure Laterality Date   APPENDECTOMY     LAPAROSCOPIC RIGHT HEMI COLECTOMY N/A 06/13/2020   Procedure: LAPAROSCOPIC RIGHT HEMI COLECTOMY AND PARTIAL OMENTECTOMY WITH LYSIS OF ADHESIONS;  Surgeon: WhIleana RoupMD;  Location: [facility];  Service: General;  Laterality: N/A;    I have reviewed the social history and family history with the patient and they are unchanged from previous note.  ALLERGIES:   has No Known Allergies.  MEDICATIONS:  Current Outpatient Medications  Medication Sig Dispense Refill   capecitabine (XELODA) 500 MG tablet Take 3 tabs in morning and 4 tabs in evening for 14 days then off 7 days. Repeat every 21 days. Take within 30 minutes after meals. 84 tablet 0   Cholecalciferol (VITAMIN D) 50 MCG (2000 UT) tablet Take 2,000 Units by mouth daily.     diphenhydrAMINE HCl, Sleep, 50 MG CAPS Take 100 mg by mouth at bedtime.     ondansetron (ZOFRAN) 8 MG tablet Take 1 tablet (8 mg total) by mouth 2 (two) times daily as needed for refractory nausea / vomiting. Start on day 3 after chemotherapy. 30 tablet 1   prochlorperazine (COMPAZINE) 10 MG tablet Take 1 tablet (10 mg total) by mouth every 6 (six) hours as needed (Nausea or vomiting). 30 tablet 1   sildenafil (VIAGRA) 100 MG tablet Take 100 mg by mouth daily as needed for erectile dysfunction.     Tetrahydrozoline HCl (VISINE OP) Place 1 drop into both eyes daily.     No current facility-administered medications for this visit.    PHYSICAL EXAMINATION: ECOG PERFORMANCE STATUS: {CHL ONC ECOG PS:[phone]}  There were no vitals filed for this visit. There were no vitals filed for this visit.  GENERAL:alert, no distress and comfortable SKIN: skin color, texture, turgor are normal, no rashes or significant lesions EYES: normal,  Conjunctiva are pink and non-injected, sclera clear OROPHARYNX:no exudate, no erythema and lips, buccal mucosa, and tongue normal  NECK: supple, thyroid normal size, non-tender, without nodularity LYMPH:  no palpable lymphadenopathy in the cervical, axillary or inguinal LUNGS: clear to auscultation and percussion with normal breathing effort HEART: regular rate & rhythm and no murmurs and no lower extremity edema ABDOMEN:abdomen soft, non-tender and normal bowel sounds Musculoskeletal:no cyanosis of digits and no clubbing  NEURO: alert & oriented x 3 with fluent speech, no focal motor/sensory  deficits  LABORATORY DATA:  I have reviewed the data as listed CBC Latest Ref Rng & Units 09/24/2020 09/03/2020 08/15/2020  WBC 4.0 - 10.5 K/uL 3.8(L) 3.6(L) 4.2  Hemoglobin 13.0 - 17.0 g/dL 13.8 14.0 13.1  Hematocrit 39.0 - 52.0 % 40.2 41.2 39.9  Platelets 150 - 400 K/uL 198 242 291     CMP Latest Ref Rng & Units 09/24/2020 09/03/2020 08/15/2020  Glucose 70 - 99 mg/dL 101(H) 116(H) 107(H)  BUN 8 - 23 mg/dL 12 12 11   Creatinine 0.61 - 1.24 mg/dL 1.09 1.15 1.12  Sodium 135 - 145 mmol/L 141 139 139  Potassium 3.5 - 5.1 mmol/L 4.0 3.6 4.1  Chloride 98 - 111 mmol/L 109 107 108  CO2 22 - 32 mmol/L 21(L) 25 24  Calcium 8.9 - 10.3 mg/dL 9.0 8.7(L) 8.7(L)  Total Protein 6.5 - 8.1 g/dL 6.9 7.2 7.1  Total Bilirubin 0.3 - 1.2 mg/dL 0.5 0.5 0.4  Alkaline Phos 38 - 126 U/L 100 94 85  AST 15 - 41 U/L 29 34 23  ALT 0 - 44 U/L 18 18 12       RADIOGRAPHIC STUDIES: I have personally reviewed the radiological images as listed and agreed with the findings in the report. No results found.   ASSESSMENT & PLAN:  No problem-specific Assessment & Plan notes found for this encounter.   No orders of the defined types were placed in this encounter.  All questions were answered. The patient knows to call the clinic with any problems, questions or concerns. No barriers to learning was detected. I spent {CHL ONC TIME VISIT - ATFTD:[id number]} counseling the patient face to face. The total time spent in the appointment was {CHL ONC TIME VISIT - WCBJS:[id number]} and more than 50% was on counseling and review of test results     Andrew Feeling, NP 11/21/20

## 2020-11-22 ENCOUNTER — Inpatient Hospital Stay: Payer: No Typology Code available for payment source | Admitting: Nurse Practitioner

## 2020-11-22 ENCOUNTER — Inpatient Hospital Stay: Payer: No Typology Code available for payment source | Attending: Hematology

## 2020-11-23 ENCOUNTER — Telehealth: Payer: Self-pay | Admitting: Nurse Practitioner

## 2020-11-23 NOTE — Telephone Encounter (Signed)
Rescheduled per 10/13 in basket, message was left with pt

## 2020-11-28 ENCOUNTER — Other Ambulatory Visit (HOSPITAL_COMMUNITY): Payer: Self-pay

## 2020-11-29 ENCOUNTER — Inpatient Hospital Stay: Payer: No Typology Code available for payment source | Admitting: Nurse Practitioner

## 2020-11-29 ENCOUNTER — Inpatient Hospital Stay: Payer: No Typology Code available for payment source

## 2020-11-29 NOTE — Progress Notes (Deleted)
[facility]   [phone] Fax:([phone]   Clinic Follow up Note   Patient Care Team: Milford Cage, PA as PCP - General (Physician Assistant) Alla Feeling, NP as Nurse Practitioner (Oncology) Jonnie Finner, RN (Inactive) as Oncology Nurse Navigator Truitt Merle, MD as Consulting Physician (Hematology and Oncology) Ileana Roup, MD as Consulting Physician (General Surgery) Koleen Distance, MD as Referring Physician (Gastroenterology) 11/29/2020  CHIEF COMPLAINT: Follow-up colon cancer  SUMMARY OF ONCOLOGIC HISTORY: Oncology History Overview Note  Cancer Staging Malignant neoplasm of ascending colon [facility]) Staging form: Colon and Rectum, AJCC 8th Edition - Pathologic stage from 06/13/2020: Stage IIIB (pT3, pN1b, cM0) - Signed by Alla Feeling, NP on 07/03/2020 Stage prefix: Initial diagnosis Histologic grading system: 4 grade system Histologic grade (G): G2 Lymph-vascular invasion (LVI): LVI present/identified, NOS Perineural invasion (PNI): Absent    Malignant neoplasm of ascending colon ([place])  04/17/2020 Procedure   Colonoscopy at [facility], by Dr. Twana First Shahid A mass was found in the proximal ascending colon, 1/3 circumference of the lumen, nonobstructing, ulcerated and friable multiple biopsies were performed.  A single polyp measuring 4 mm was found in the descending colon, a polypectomy was performed.  The resection was complete.  Medium size internal hemorrhoids were found.   04/17/2020 Initial Biopsy   Path : Proximal ascending colon: Invasive moderately differentiated adenocarcinoma arising in a tubular adenoma with high-grade dysplasia  Descending colon polyp: tubular adenoma   04/17/2020 Imaging   Staging CT A/P 04/17/20 at [facility]: Impression The lung bases are clear.  Focal 6 mm low-density lesion in the right lobe of the liver which does not enhance and likely represents a small cyst.  No altered areas of  attenuation are identified to suggest hepatic mass.  The spleen, pancreas, adrenal glands have a normal appearance.  There are cortical cysts in both kidneys.  Both kidneys function normally.  Large amount of gas in the colon and this post colonoscopy patient.  No obvious colonic masses.  No pneumoperitoneum.  No free fluid or abdominal fluid collections.  No lytic or blastic lesions.  Right inguinal hernia filled with fat.  Impression: Renal cyst with probable hepatic cyst without definite evidence of metastatic disease.  Right inguinal hernia.   06/04/2020 Imaging   Staging CT chest IMPRESSION: 1. No evidence of metastatic disease. 2. Hepatic steatosis. 3. Aortic atherosclerosis (ICD10-I70.0). Coronary artery calcification. 4. Enlarged pulmonary arteries, indicative of pulmonary arterial hypertension.     06/13/2020 Cancer Staging   Staging form: Colon and Rectum, AJCC 8th Edition - Pathologic stage from 06/13/2020: Stage IIIB (pT3, pN1b, cM0) - Signed by Alla Feeling, NP on 07/03/2020 Stage prefix: Initial diagnosis Histologic grading system: 4 grade system Histologic grade (G): G2 Lymph-vascular invasion (LVI): LVI present/identified, NOS Perineural invasion (PNI): Absent   06/13/2020 Surgery   PROCEDURE: Laparoscopic-assisted right hemicolectomy 2. Lysis of adhesions x 90 minutes 3. Partial omentectomy 4. Bilateral transversus abdominus plane blocks SURGEON: [name], MD   06/13/2020 Pathology Results   FINAL MICROSCOPIC DIAGNOSIS:  A. COLON, RIGHT AND TERMINAL ILEUM, RESECTION:  - Invasive adenocarcinoma, moderately differentiated, spanning 3.8 cm.  - Tumor invades through muscularis propria into pericolonic tissue.  - Lymphovascular invasion present.  - Resection margins are negative.  - Metastatic carcinoma in two of nineteen lymph nodes (2/19).  - See oncology table.   B. OMENTUM, RESECTION:  - Benign fibroadipose tissue.   pT3pN1b   IHC EXPRESSION  RESULTS  TEST           RESULT  MLH1:          Preserved nuclear expression  MSH2:          Preserved nuclear expression  MSH6:          Preserved nuclear expression  PMS2:          Preserved nuclear expression    06/13/2020 Initial Diagnosis   Malignant neoplasm of ascending colon (HCC)   07/23/2020 -  Chemotherapy   CAPOX q3weeks with Xeloda 1500mg in the AM and 2000mg in the PM starting 07/23/20.       CURRENT THERAPY: Completed adjuvant chemo with CAPOX q3weeks with Xeloda 1500mg in AM and 2000mg in PM starting 07/23/20 (3 months), now on surveillance  INTERVAL HISTORY: Mr. [name] returns for follow-up as scheduled.  He was last seen by Dr. Feng 09/24/2020 for his last cycle of chemo.   REVIEW OF SYSTEMS:   Constitutional: Denies fevers, chills or abnormal weight loss Eyes: Denies blurriness of vision Ears, nose, mouth, throat, and face: Denies mucositis or sore throat Respiratory: Denies cough, dyspnea or wheezes Cardiovascular: Denies palpitation, chest discomfort or lower extremity swelling Gastrointestinal:  Denies nausea, heartburn or change in bowel habits Skin: Denies abnormal skin rashes Lymphatics: Denies new lymphadenopathy or easy bruising Neurological:Denies numbness, tingling or new weaknesses Behavioral/Psych: Mood is stable, no new changes  All other systems were reviewed with the patient and are negative.  MEDICAL HISTORY:  Past Medical History:  Diagnosis Date   Cancer (HCC)    colon cancer, basal cell    Pneumonia    2020     SURGICAL HISTORY: Past Surgical History:  Procedure Laterality Date   APPENDECTOMY     LAPAROSCOPIC RIGHT HEMI COLECTOMY N/A 06/13/2020   Procedure: LAPAROSCOPIC RIGHT HEMI COLECTOMY AND PARTIAL OMENTECTOMY WITH LYSIS OF ADHESIONS;  Surgeon: White, Christopher M, MD;  Location: [facility];  Service: General;  Laterality: N/A;    I have reviewed the social history and family history with the patient and they are unchanged from previous  note.  ALLERGIES:  has No Known Allergies.  MEDICATIONS:  Current Outpatient Medications  Medication Sig Dispense Refill   capecitabine (XELODA) 500 MG tablet Take 3 tabs in morning and 4 tabs in evening for 14 days then off 7 days. Repeat every 21 days. Take within 30 minutes after meals. 84 tablet 0   Cholecalciferol (VITAMIN D) 50 MCG (2000 UT) tablet Take 2,000 Units by mouth daily.     diphenhydrAMINE HCl, Sleep, 50 MG CAPS Take 100 mg by mouth at bedtime.     ondansetron (ZOFRAN) 8 MG tablet Take 1 tablet (8 mg total) by mouth 2 (two) times daily as needed for refractory nausea / vomiting. Start on day 3 after chemotherapy. 30 tablet 1   prochlorperazine (COMPAZINE) 10 MG tablet Take 1 tablet (10 mg total) by mouth every 6 (six) hours as needed (Nausea or vomiting). 30 tablet 1   sildenafil (VIAGRA) 100 MG tablet Take 100 mg by mouth daily as needed for erectile dysfunction.     Tetrahydrozoline HCl (VISINE OP) Place 1 drop into both eyes daily.     No current facility-administered medications for this visit.    PHYSICAL EXAMINATION: ECOG PERFORMANCE STATUS: {CHL ONC ECOG PS:[id number]}  There were no vitals filed for this visit. There were no vitals filed for this visit.  GENERAL:alert, no distress and comfortable SKIN: skin color, texture, turgor are normal, no   rashes or significant lesions EYES: normal, Conjunctiva are pink and non-injected, sclera clear OROPHARYNX:no exudate, no erythema and lips, buccal mucosa, and tongue normal  NECK: supple, thyroid normal size, non-tender, without nodularity LYMPH:  no palpable lymphadenopathy in the cervical, axillary or inguinal LUNGS: clear to auscultation and percussion with normal breathing effort HEART: regular rate & rhythm and no murmurs and no lower extremity edema ABDOMEN:abdomen soft, non-tender and normal bowel sounds Musculoskeletal:no cyanosis of digits and no clubbing  NEURO: alert & oriented x 3 with fluent speech, no  focal motor/sensory deficits  LABORATORY DATA:  I have reviewed the data as listed CBC Latest Ref Rng & Units 09/24/2020 09/03/2020 08/15/2020  WBC 4.0 - 10.5 K/uL 3.8(L) 3.6(L) 4.2  Hemoglobin 13.0 - 17.0 g/dL 13.8 14.0 13.1  Hematocrit 39.0 - 52.0 % 40.2 41.2 39.9  Platelets 150 - 400 K/uL 198 242 291     CMP Latest Ref Rng & Units 09/24/2020 09/03/2020 08/15/2020  Glucose 70 - 99 mg/dL 101(H) 116(H) 107(H)  BUN 8 - 23 mg/dL _0 Creatinine 0.61 - 1.24 mg/dL 1.09 1.15 1.12  Sodium 135 - 145 mmol/L 141 139 139  Potassium 3.5 - 5.1 mmol/L 4.0 3.6 4.1  Chloride 98 - 111 mmol/L 109 107 108  CO2 22 - 32 mmol/L 21(L) 25 24  Calcium 8.9 - 10.3 mg/dL 9.0 8.7(L) 8.7(L)  Total Protein 6.5 - 8.1 g/dL 6.9 7.2 7.1  Total Bilirubin 0.3 - 1.2 mg/dL 0.5 0.5 0.4  Alkaline Phos 38 - 126 U/L 100 94 85  AST 15 - 41 U/L 29 34 23  ALT 0 - 44 U/L _1 RADIOGRAPHIC STUDIES: I have personally reviewed the radiological images as listed and agreed with the findings in the report. No results found.   ASSESSMENT & PLAN:  No problem-specific Assessment & Plan notes found for this encounter.   No orders of the defined types were placed in this encounter.  All questions were answered. The patient knows to call the clinic with any problems, questions or concerns. No barriers to learning was detected. I spent {CHL ONC TIME VISIT - QZRAQ:[id number]} counseling the patient face to face. The total time spent in the appointment was {CHL ONC TIME VISIT - TGYBW:[id number]} and more than 50% was on counseling and review of test results     Alla Feeling, NP 11/29/20

## 2020-11-30 ENCOUNTER — Telehealth: Payer: Self-pay | Admitting: Nurse Practitioner

## 2020-11-30 NOTE — Telephone Encounter (Signed)
Sch per 10/21 los, left msg 

## 2020-12-12 NOTE — Progress Notes (Deleted)
[place]   Telephone:([phone] Fax:(336) [phone]   Clinic Follow up Note   Patient Care Team: Milford Cage, PA as PCP - General (Physician Assistant) Alla Feeling, NP as Nurse Practitioner (Oncology) Jonnie Finner, RN (Inactive) as Oncology Nurse Navigator Truitt Merle, MD as Consulting Physician (Hematology and Oncology) Ileana Roup, MD as Consulting Physician (General Surgery) Koleen Distance, MD as Referring Physician (Gastroenterology) 12/12/2020  CHIEF COMPLAINT: Follow up colon cancer   SUMMARY OF ONCOLOGIC HISTORY: Oncology History Overview Note  Cancer Staging Malignant neoplasm of ascending colon [facility]) Staging form: Colon and Rectum, AJCC 8th Edition - Pathologic stage from 06/13/2020: Stage IIIB (pT3, pN1b, cM0) - Signed by Alla Feeling, NP on 07/03/2020 Stage prefix: Initial diagnosis Histologic grading system: 4 grade system Histologic grade (G): G2 Lymph-vascular invasion (LVI): LVI present/identified, NOS Perineural invasion (PNI): Absent    Malignant neoplasm of ascending colon ([place])  04/17/2020 Procedure   Colonoscopy at [facility], by Dr. Twana First Shahid A mass was found in the proximal ascending colon, 1/3 circumference of the lumen, nonobstructing, ulcerated and friable multiple biopsies were performed.  A single polyp measuring 4 mm was found in the descending colon, a polypectomy was performed.  The resection was complete.  Medium size internal hemorrhoids were found.   04/17/2020 Initial Biopsy   Path : Proximal ascending colon: Invasive moderately differentiated adenocarcinoma arising in a tubular adenoma with high-grade dysplasia  Descending colon polyp: tubular adenoma   04/17/2020 Imaging   Staging CT A/P 04/17/20 at [facility]: Impression The lung bases are clear.  Focal 6 mm low-density lesion in the right lobe of the liver which does not enhance and likely represents a small cyst.  No altered areas of  attenuation are identified to suggest hepatic mass.  The spleen, pancreas, adrenal glands have a normal appearance.  There are cortical cysts in both kidneys.  Both kidneys function normally.  Large amount of gas in the colon and this post colonoscopy patient.  No obvious colonic masses.  No pneumoperitoneum.  No free fluid or abdominal fluid collections.  No lytic or blastic lesions.  Right inguinal hernia filled with fat.  Impression: Renal cyst with probable hepatic cyst without definite evidence of metastatic disease.  Right inguinal hernia.   06/04/2020 Imaging   Staging CT chest IMPRESSION: 1. No evidence of metastatic disease. 2. Hepatic steatosis. 3. Aortic atherosclerosis (ICD10-I70.0). Coronary artery calcification. 4. Enlarged pulmonary arteries, indicative of pulmonary arterial hypertension.     06/13/2020 Cancer Staging   Staging form: Colon and Rectum, AJCC 8th Edition - Pathologic stage from 06/13/2020: Stage IIIB (pT3, pN1b, cM0) - Signed by Alla Feeling, NP on 07/03/2020 Stage prefix: Initial diagnosis Histologic grading system: 4 grade system Histologic grade (G): G2 Lymph-vascular invasion (LVI): LVI present/identified, NOS Perineural invasion (PNI): Absent    06/13/2020 Surgery   PROCEDURE: Laparoscopic-assisted right hemicolectomy 2. Lysis of adhesions x 90 minutes 3. Partial omentectomy 4. Bilateral transversus abdominus plane blocks SURGEON: [name], MD   06/13/2020 Pathology Results   FINAL MICROSCOPIC DIAGNOSIS:  A. COLON, RIGHT AND TERMINAL ILEUM, RESECTION:  - Invasive adenocarcinoma, moderately differentiated, spanning 3.8 cm.  - Tumor invades through muscularis propria into pericolonic tissue.  - Lymphovascular invasion present.  - Resection margins are negative.  - Metastatic carcinoma in two of nineteen lymph nodes (2/19).  - See oncology table.   B. OMENTUM, RESECTION:  - Benign fibroadipose tissue.   pT3pN1b   IHC EXPRESSION  RESULTS  TEST           RESULT  MLH1:          Preserved nuclear expression  MSH2:          Preserved nuclear expression  MSH6:          Preserved nuclear expression  PMS2:          Preserved nuclear expression    06/13/2020 Initial Diagnosis   Malignant neoplasm of ascending colon ([place])   07/23/2020 -  Chemotherapy   CAPOX q3weeks with Xeloda $RemoveBef'1500mg'XxcRdwWVVc$  in the AM and $Remo'2000mg'CZdMA$  in the PM starting 07/23/20.       CURRENT THERAPY: Completed 3 months adjuvant CAPOX 07/23/20 - 10/2020   INTERVAL HISTORY: Mr. Eisner returns for surveillance visit as scheduled. He was last seen 09/24/20 for his final 4th cycle of Oxali, he omitted cycle 5. He continued Xeloda alone for 1 cycle after that.    REVIEW OF SYSTEMS:   Constitutional: Denies fevers, chills or abnormal weight loss Eyes: Denies blurriness of vision Ears, nose, mouth, throat, and face: Denies mucositis or sore throat Respiratory: Denies cough, dyspnea or wheezes Cardiovascular: Denies palpitation, chest discomfort or lower extremity swelling Gastrointestinal:  Denies nausea, heartburn or change in bowel habits Skin: Denies abnormal skin rashes Lymphatics: Denies new lymphadenopathy or easy bruising Neurological:Denies numbness, tingling or new weaknesses Behavioral/Psych: Mood is stable, no new changes  All other systems were reviewed with the patient and are negative.  MEDICAL HISTORY:  Past Medical History:  Diagnosis Date   Cancer ([place])    colon cancer, basal cell    Pneumonia    2020     SURGICAL HISTORY: Past Surgical History:  Procedure Laterality Date   APPENDECTOMY     LAPAROSCOPIC RIGHT HEMI COLECTOMY N/A 06/13/2020   Procedure: LAPAROSCOPIC RIGHT HEMI COLECTOMY AND PARTIAL OMENTECTOMY WITH LYSIS OF ADHESIONS;  Surgeon: Ileana Roup, MD;  Location: [facility];  Service: General;  Laterality: N/A;    I have reviewed the social history and family history with the patient and they are unchanged from previous  note.  ALLERGIES:  has No Known Allergies.  MEDICATIONS:  Current Outpatient Medications  Medication Sig Dispense Refill   capecitabine (XELODA) 500 MG tablet Take 3 tabs in morning and 4 tabs in evening for 14 days then off 7 days. Repeat every 21 days. Take within 30 minutes after meals. 84 tablet 0   Cholecalciferol (VITAMIN D) 50 MCG (2000 UT) tablet Take 2,000 Units by mouth daily.     diphenhydrAMINE HCl, Sleep, 50 MG CAPS Take 100 mg by mouth at bedtime.     ondansetron (ZOFRAN) 8 MG tablet Take 1 tablet (8 mg total) by mouth 2 (two) times daily as needed for refractory nausea / vomiting. Start on day 3 after chemotherapy. 30 tablet 1   prochlorperazine (COMPAZINE) 10 MG tablet Take 1 tablet (10 mg total) by mouth every 6 (six) hours as needed (Nausea or vomiting). 30 tablet 1   sildenafil (VIAGRA) 100 MG tablet Take 100 mg by mouth daily as needed for erectile dysfunction.     Tetrahydrozoline HCl (VISINE OP) Place 1 drop into both eyes daily.     No current facility-administered medications for this visit.    PHYSICAL EXAMINATION: ECOG PERFORMANCE STATUS: {CHL ONC ECOG [phone]}  There were no vitals filed for this visit. There were no vitals filed for this visit.  GENERAL:alert, no distress and comfortable SKIN: skin color, texture, turgor are normal,  no rashes or significant lesions EYES: normal, Conjunctiva are pink and non-injected, sclera clear OROPHARYNX:no exudate, no erythema and lips, buccal mucosa, and tongue normal  NECK: supple, thyroid normal size, non-tender, without nodularity LYMPH:  no palpable lymphadenopathy in the cervical, axillary or inguinal LUNGS: clear to auscultation and percussion with normal breathing effort HEART: regular rate & rhythm and no murmurs and no lower extremity edema ABDOMEN:abdomen soft, non-tender and normal bowel sounds Musculoskeletal:no cyanosis of digits and no clubbing  NEURO: alert & oriented x 3 with fluent speech, no  focal motor/sensory deficits  LABORATORY DATA:  I have reviewed the data as listed CBC Latest Ref Rng & Units 09/24/2020 09/03/2020 08/15/2020  WBC 4.0 - 10.5 K/uL 3.8(L) 3.6(L) 4.2  Hemoglobin 13.0 - 17.0 g/dL 13.8 14.0 13.1  Hematocrit 39.0 - 52.0 % 40.2 41.2 39.9  Platelets 150 - 400 K/uL 198 242 291     CMP Latest Ref Rng & Units 09/24/2020 09/03/2020 08/15/2020  Glucose 70 - 99 mg/dL 101(H) 116(H) 107(H)  BUN 8 - 23 mg/dL _0 Creatinine 0.61 - 1.24 mg/dL 1.09 1.15 1.12  Sodium 135 - 145 mmol/L 141 139 139  Potassium 3.5 - 5.1 mmol/L 4.0 3.6 4.1  Chloride 98 - 111 mmol/L 109 107 108  CO2 22 - 32 mmol/L 21(L) 25 24  Calcium 8.9 - 10.3 mg/dL 9.0 8.7(L) 8.7(L)  Total Protein 6.5 - 8.1 g/dL 6.9 7.2 7.1  Total Bilirubin 0.3 - 1.2 mg/dL 0.5 0.5 0.4  Alkaline Phos 38 - 126 U/L 100 94 85  AST 15 - 41 U/L 29 34 23  ALT 0 - 44 U/L _1 RADIOGRAPHIC STUDIES: I have personally reviewed the radiological images as listed and agreed with the findings in the report. No results found.   ASSESSMENT & PLAN:  No problem-specific Assessment & Plan notes found for this encounter.   No orders of the defined types were placed in this encounter.  All questions were answered. The patient knows to call the clinic with any problems, questions or concerns. No barriers to learning was detected. I spent {CHL ONC TIME VISIT - WUJWJ:[id number]} counseling the patient face to face. The total time spent in the appointment was {CHL ONC TIME VISIT - OZHYQ:[id number]} and more than 50% was on counseling and review of test results     Alla Feeling, NP 12/12/20

## 2020-12-13 ENCOUNTER — Telehealth: Payer: Self-pay | Admitting: Nurse Practitioner

## 2020-12-13 ENCOUNTER — Inpatient Hospital Stay: Payer: No Typology Code available for payment source | Attending: Hematology

## 2020-12-13 ENCOUNTER — Inpatient Hospital Stay: Payer: No Typology Code available for payment source | Admitting: Nurse Practitioner
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
## 2021-02-05 ENCOUNTER — Other Ambulatory Visit: Payer: Self-pay

## 2021-02-06 ENCOUNTER — Other Ambulatory Visit: Payer: Self-pay

## 2021-02-06 NOTE — Progress Notes (Signed)
Pt called on 02/05/2021 stating that he would like to schedule an appt with Dr. Burr Medico or Cira Rue, NP d/t pt has some bleeding from his rectum.  Pt stated the blood was seen only when he wiped his rectum.  Pt described blood as bright red in color and it was just a streak of blood on the toilet paper when he wiped his rectum.  Pt denied pain or constipation.  Pt stated he goes about 2x/day.  Pt stated he does has a hx of hemorrhoids but stated they were removed when he had his colonoscopy.  Pt stated he saw the blood on 02/04/2021 and on 02/05/2021 when he had a bowel movement but only when he wiped his bottom.  Pt denied blood actually in the stool. Informed pt that Dr. Burr Medico would not return to the office until 02/06/2021 and her schedule is full for the remainder of the week.  Cira Rue, NP will be out of the office until after the new year.  Informed pt that I will send a message to scheduling to have him scheduled for Dr. Burr Medico sometime next week and that our Scheduling Team will give him a call to confirm appt date and time.

## 2021-02-12 ENCOUNTER — Telehealth: Payer: Self-pay | Admitting: Hematology

## 2021-02-12 NOTE — Telephone Encounter (Signed)
Cancelled tomorrow's appointment due to provider having COVID. Patient stated he will call back to reschedule.

## 2021-02-13 ENCOUNTER — Inpatient Hospital Stay: Payer: No Typology Code available for payment source

## 2021-02-13 ENCOUNTER — Inpatient Hospital Stay: Payer: No Typology Code available for payment source | Admitting: Hematology

## 2021-02-13 DIAGNOSIS — C182 Malignant neoplasm of ascending colon: Secondary | ICD-10-CM

## 2021-02-21 ENCOUNTER — Telehealth: Payer: Self-pay | Admitting: Hematology

## 2021-02-21 NOTE — Telephone Encounter (Signed)
Sch per 1/11 inb, pt aware

## 2021-03-07 ENCOUNTER — Inpatient Hospital Stay: Payer: No Typology Code available for payment source | Admitting: Hematology

## 2021-03-07 ENCOUNTER — Inpatient Hospital Stay: Payer: No Typology Code available for payment source | Attending: Hematology

## 2021-03-27 NOTE — Telephone Encounter (Signed)
Error

## 2021-09-02 ENCOUNTER — Other Ambulatory Visit: Payer: Self-pay

## 2022-10-11 IMAGING — CT CT CHEST W/ CM
1 of 2 series · 14 of 30 positions shown, 18 images · IV contrast (APPLIED)
Comparison: None.

CLINICAL DATA: Colon cancer staging.

EXAM:
CT CHEST WITH CONTRAST
TECHNIQUE: Multidetector CT imaging of the chest was performed during
intravenous contrast administration.
CONTRAST:  75mL LDRHNV-466 IOPAMIDOL (LDRHNV-466) INJECTION 61%
Creatinine was obtained on site at [HOSPITAL] at [HOSPITAL].
Results: Creatinine 1.2 mg/dL.

[Series 2: chest w/cm · axial · 0.75mm/px · z∈[-364,-76]mm · 14 of 170 slices shown, 18 images]
[im 13/170  mediastinal]
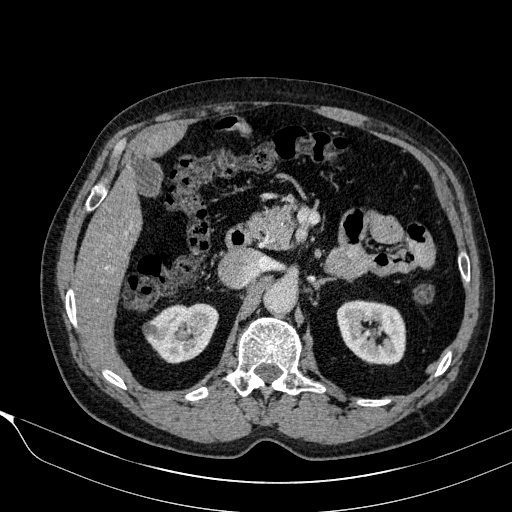
[im 13/170  lung]
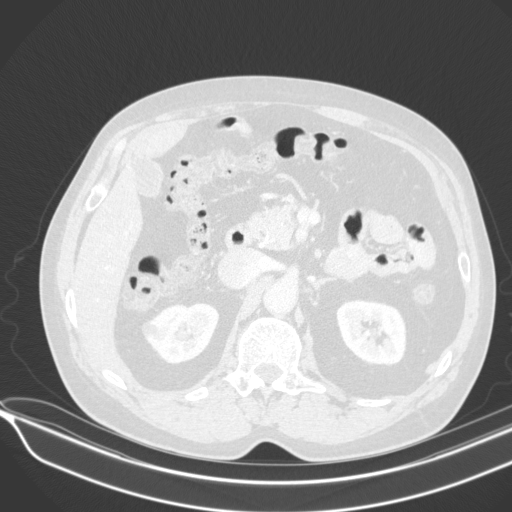
[im 25/170  lung]
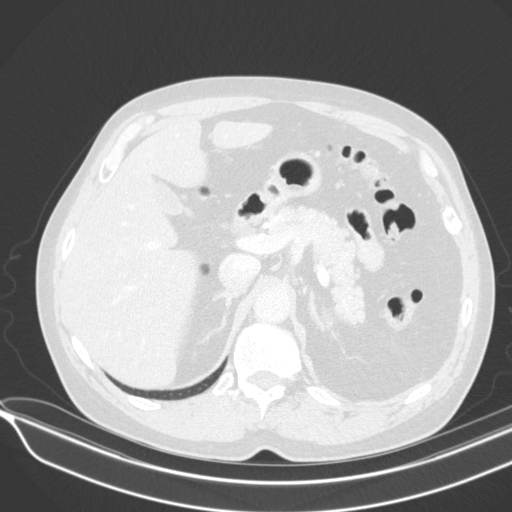
[im 37/170  lung]
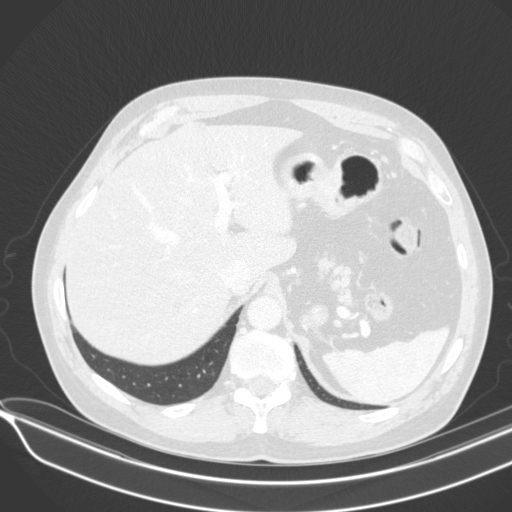
[im 49/170  lung]
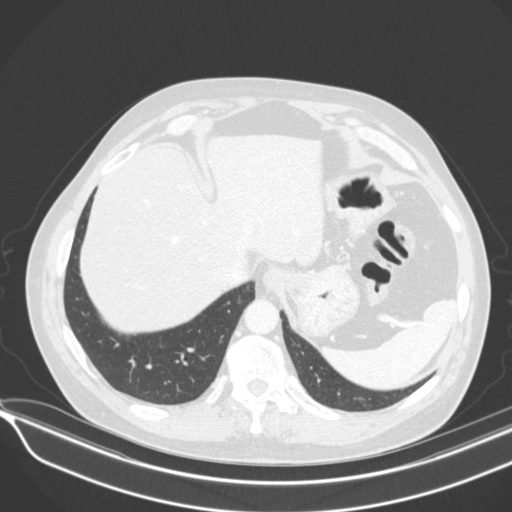
[im 61/170  mediastinal]
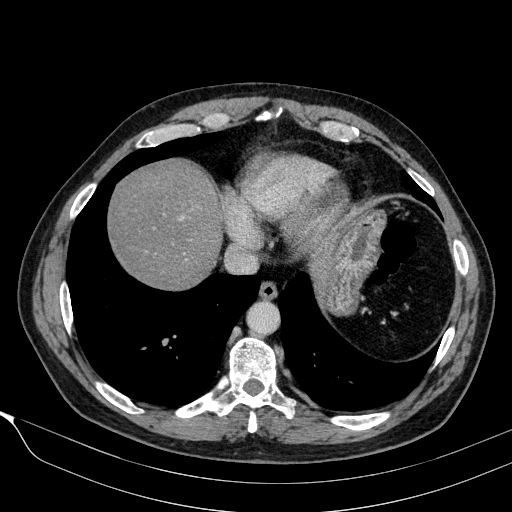
[im 61/170  lung]
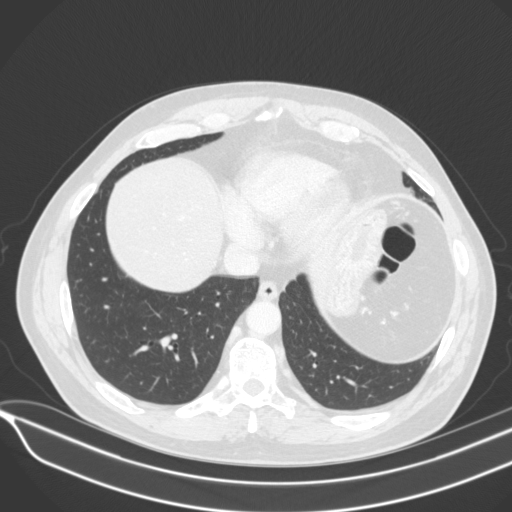
[im 73/170  lung]
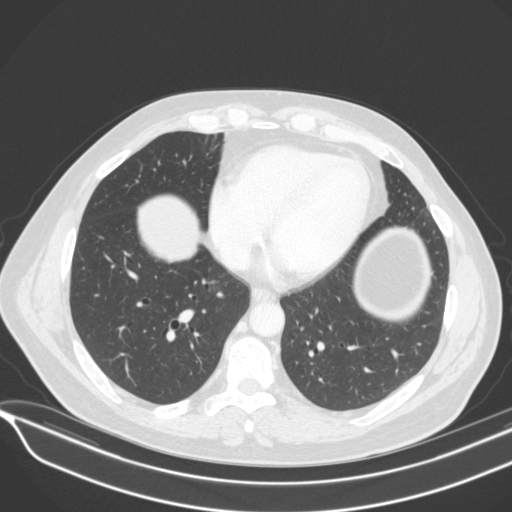
[im 81/170  lung]
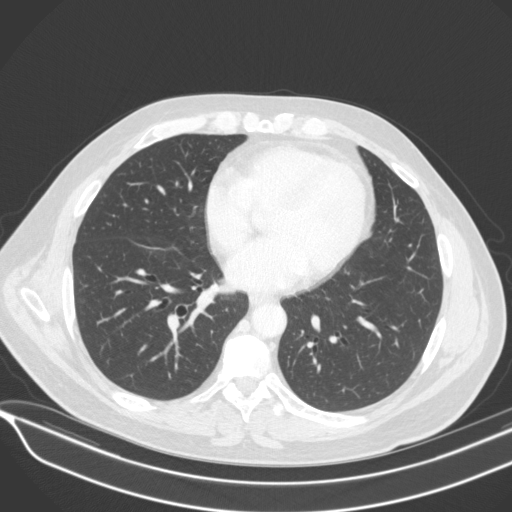
[im 85/170  lung]
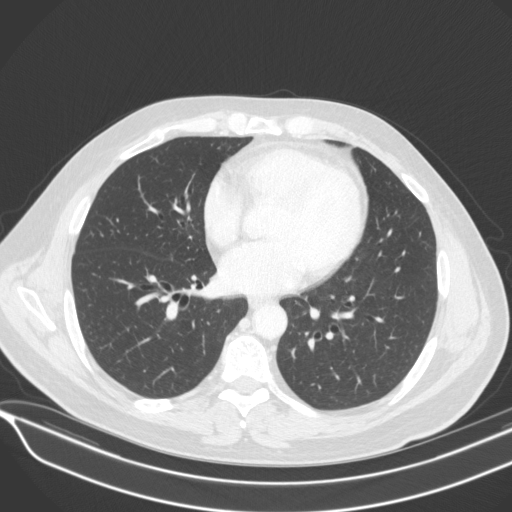
[im 97/170  mediastinal]
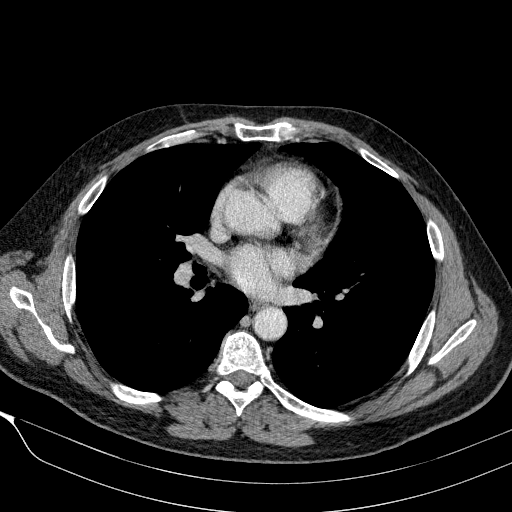
[im 97/170  lung]
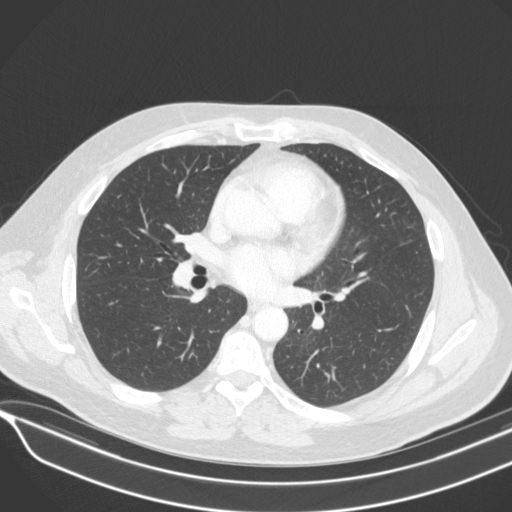
[im 109/170  lung]
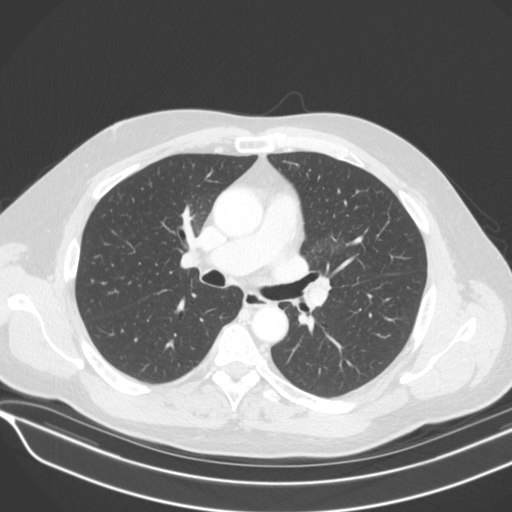
[im 121/170  lung]
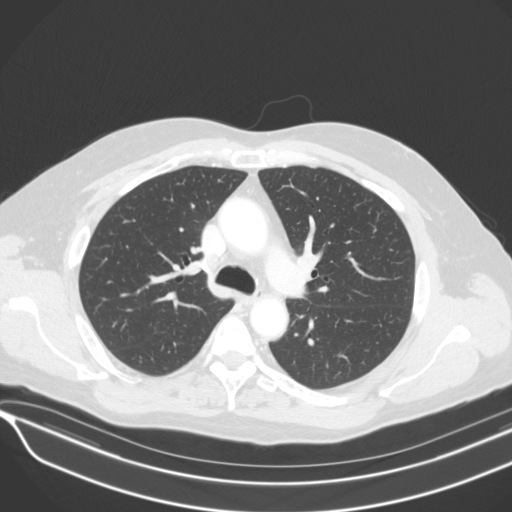
[im 133/170  lung]
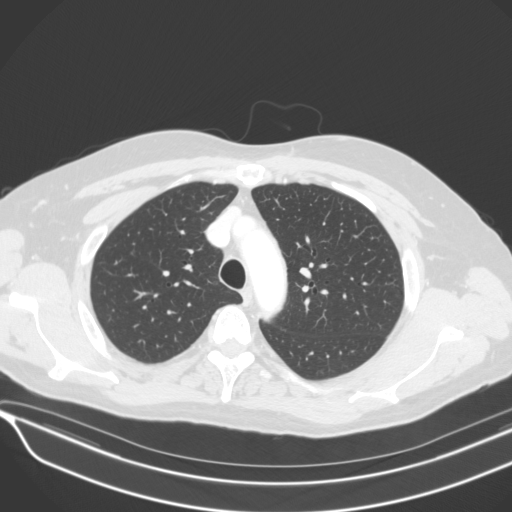
[im 145/170  mediastinal]
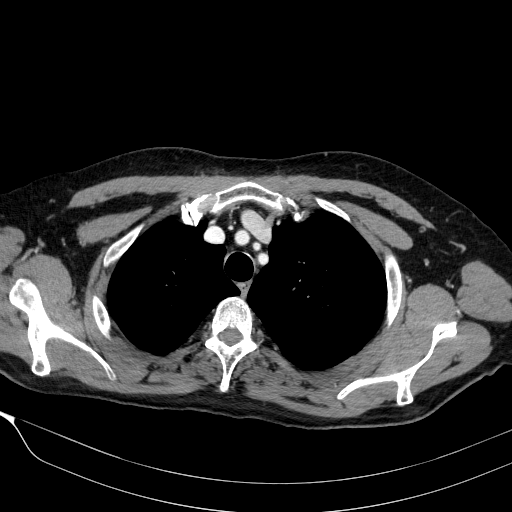
[im 145/170  lung]
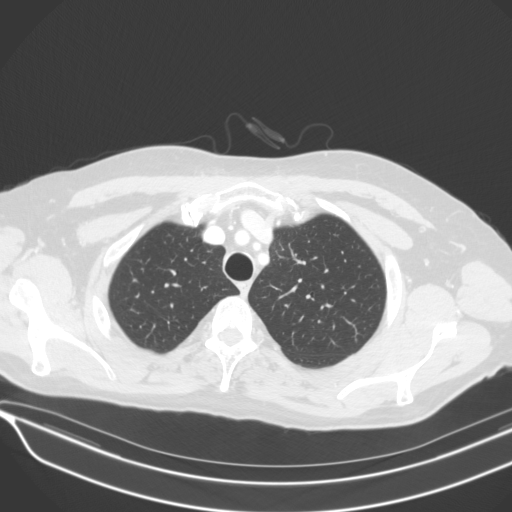
[im 157/170  lung]
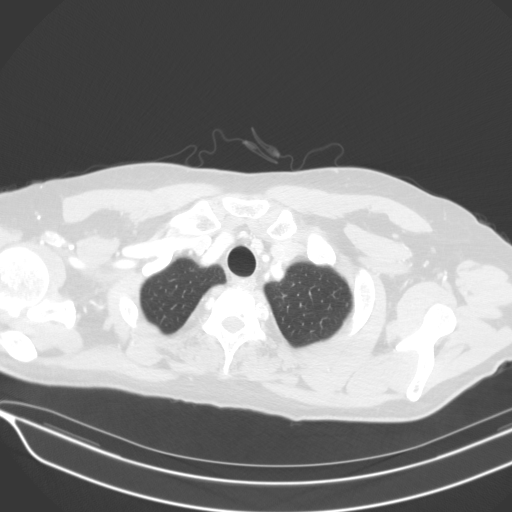

[14 of 30 positions shown; findings below may reference images not displayed]

FINDINGS: Cardiovascular: Atherosclerotic calcification of the aorta, aortic
valve and coronary arteries. Right and left pulmonary arteries are
enlarged. Heart size normal. No pericardial effusion.

Mediastinum/Nodes: No pathologically enlarged mediastinal, hilar or
axillary lymph nodes. Esophagus is grossly unremarkable.

Lungs/Pleura: Minimal patchy peribronchovascular ground-glass in the
lingula, likely infectious or inflammatory in etiology. No pulmonary
nodules. No pleural fluid. Airway is unremarkable.

Upper Abdomen: Liver is decreased in attenuation diffusely.
Subcentimeter low-attenuation lesion in the right hepatic lobe is
too small to characterize. Visualized portions of the liver,
gallbladder and adrenal glands are otherwise unremarkable. 1.2 cm
low-attenuation lesion in the right kidney is too small to
characterize. Visualized portions of the kidneys, spleen, pancreas,
stomach and bowel are otherwise unremarkable.

Musculoskeletal: Minimal degenerative change in the spine.
IMPRESSION: 1. No evidence of metastatic disease.
2. Hepatic steatosis.
3. Aortic atherosclerosis ([id number]). Coronary artery
calcification.
4. Enlarged pulmonary arteries, indicative of pulmonary arterial
hypertension.

## 2023-01-29 LAB — MOLECULAR PATHOLOGY

## 2023-02-11 DIAGNOSIS — I10 Essential (primary) hypertension: Secondary | ICD-10-CM

## 2023-02-11 HISTORY — PX: COLONOSCOPY: SHX174

## 2023-02-11 HISTORY — DX: Essential (primary) hypertension: I10

## 2023-10-06 ENCOUNTER — Encounter: Payer: Self-pay | Admitting: Physician Assistant

## 2023-11-27 ENCOUNTER — Ambulatory Visit: Payer: Self-pay | Admitting: Physician Assistant

## 2023-11-27 ENCOUNTER — Other Ambulatory Visit

## 2023-11-27 ENCOUNTER — Ambulatory Visit: Admitting: Physician Assistant

## 2023-11-27 ENCOUNTER — Encounter: Payer: Self-pay | Admitting: Physician Assistant

## 2023-11-27 VITALS — BP 128/74 | HR 79 | Ht 72.0 in | Wt 200.4 lb

## 2023-11-27 DIAGNOSIS — C182 Malignant neoplasm of ascending colon: Secondary | ICD-10-CM

## 2023-11-27 DIAGNOSIS — R197 Diarrhea, unspecified: Secondary | ICD-10-CM | POA: Diagnosis not present

## 2023-11-27 DIAGNOSIS — Z9049 Acquired absence of other specified parts of digestive tract: Secondary | ICD-10-CM

## 2023-11-27 DIAGNOSIS — K769 Liver disease, unspecified: Secondary | ICD-10-CM

## 2023-11-27 DIAGNOSIS — F1010 Alcohol abuse, uncomplicated: Secondary | ICD-10-CM

## 2023-11-27 DIAGNOSIS — I272 Pulmonary hypertension, unspecified: Secondary | ICD-10-CM | POA: Diagnosis not present

## 2023-11-27 DIAGNOSIS — R7989 Other specified abnormal findings of blood chemistry: Secondary | ICD-10-CM

## 2023-11-27 DIAGNOSIS — R945 Abnormal results of liver function studies: Secondary | ICD-10-CM

## 2023-11-27 DIAGNOSIS — E876 Hypokalemia: Secondary | ICD-10-CM

## 2023-11-27 LAB — COMPREHENSIVE METABOLIC PANEL WITH GFR
ALT: 21 U/L (ref 0–53)
AST: 41 U/L — ABNORMAL HIGH (ref 0–37)
Albumin: 4.5 g/dL (ref 3.5–5.2)
Alkaline Phosphatase: 111 U/L (ref 39–117)
BUN: 19 mg/dL (ref 6–23)
CO2: 34 meq/L — ABNORMAL HIGH (ref 19–32)
Calcium: 9.8 mg/dL (ref 8.4–10.5)
Chloride: 94 meq/L — ABNORMAL LOW (ref 96–112)
Creatinine, Ser: 1.38 mg/dL (ref 0.40–1.50)
GFR: 53.67 mL/min — ABNORMAL LOW (ref >60.00)
Glucose, Bld: 99 mg/dL (ref 70–99)
Potassium: 2.9 meq/L — ABNORMAL LOW (ref 3.5–5.1)
Sodium: 139 meq/L (ref 135–145)
Total Bilirubin: 0.4 mg/dL (ref 0.2–1.2)
Total Protein: 8.9 g/dL — ABNORMAL HIGH (ref 6.0–8.3)

## 2023-11-27 LAB — CBC WITH DIFFERENTIAL/PLATELET
Basophils Absolute: 0 K/uL (ref 0.0–0.1)
Basophils Relative: 0.7 % (ref 0.0–3.0)
Eosinophils Absolute: 0.1 K/uL (ref 0.0–0.7)
Eosinophils Relative: 0.7 % (ref 0.0–5.0)
HCT: 45.4 % (ref 39.0–52.0)
Hemoglobin: 15.6 g/dL (ref 13.0–17.0)
Lymphocytes Relative: 30.3 % (ref 12.0–46.0)
Lymphs Abs: 2.1 K/uL (ref 0.7–4.0)
MCHC: 34.3 g/dL (ref 30.0–36.0)
MCV: 94.7 fl (ref 78.0–100.0)
Monocytes Absolute: 0.6 K/uL (ref 0.1–1.0)
Monocytes Relative: 8.3 % (ref 3.0–12.0)
Neutro Abs: 4.1 K/uL (ref 1.4–7.7)
Neutrophils Relative %: 60 % (ref 43.0–77.0)
Platelets: 373 K/uL (ref 150.0–400.0)
RBC: 4.79 Mil/uL (ref 4.22–5.81)
RDW: 13.2 % (ref 11.5–15.5)
WBC: 6.9 K/uL (ref 4.0–10.5)

## 2023-11-27 LAB — IBC + FERRITIN
Ferritin: 406.2 ng/mL — ABNORMAL HIGH (ref 22.0–322.0)
Iron: 113 ug/dL (ref 42–165)
Saturation Ratios: 33.2 % (ref 20.0–50.0)
TIBC: 340.2 ug/dL (ref 250.0–450.0)
Transferrin: 243 mg/dL (ref 212.0–360.0)

## 2023-11-27 LAB — SEDIMENTATION RATE: Sed Rate: 49 mm/h — ABNORMAL HIGH (ref 0–20)

## 2023-11-27 LAB — TSH: TSH: 1.93 u[IU]/mL (ref 0.35–5.50)

## 2023-11-27 LAB — GAMMA GT: GGT: 127 U/L — ABNORMAL HIGH (ref 7–51)

## 2023-11-27 MED ORDER — NA SULFATE-K SULFATE-MG SULF 17.5-3.13-1.6 GM/177ML PO SOLN: 1.0000 | Freq: Once | ORAL | 0 refills | Status: AC

## 2023-11-27 MED ORDER — POTASSIUM CHLORIDE CRYS ER 20 MEQ PO TBCR: EXTENDED_RELEASE_TABLET | ORAL | 1 refills | Status: DC

## 2023-11-27 NOTE — Progress Notes (Signed)
 11/27/2023 Andrew Hines [id number] Jan 21, 1961  Referring provider: Dow Ozell PARAS, PA Primary GI doctor: Dr. Suzann  ASSESSMENT AND PLAN:  Diarrhea X 6 months No melena, no hematochezia 1-2 x a day, occ after food.  No ABX, no weight loss, no changes in medications Soft AB, no nausea, no signs of obstruction Denies melena or hematochezia  -CRP/ESR to rule out inflammation - TSH  -TTG/IGA to evaluate for celiac disease.  -Add on citracel/benefiber, FODMAP, trial off lactulose and lifestyle changes discussed -get CT AB and pelvis - schedule colonoscopy -Consider SIBO testing or xifaxin trial pending results  Malignant neoplasm of ascending colon 06/2020  stage IIIb (pT3, pN1b, cM0)  Last seen in the office 09/24/2020 by Dr. Lanny 04/17/2020 colonoscopy [facility] mass proximal ascending colon one third circumference single 4 mm polyp descending colon medium size internal hemorrhoids Status post right hemicolectomy with Dr. Teresa 06/13/2020 with metastatic carcinoma to 2 of the 19 lymph nodes Status postchemotherapy 2022 Never went to follow ups due to lost of insurance Hemoglobin 18.8, platelets 236 WBC 5.1 Status post right hemicolectomy and chemotherapy in 2022.  - Schedule colonoscopy for evaluation,We have discussed the risks of bleeding, infection, perforation, medication reactions, and remote risk of death associated with colonoscopy. All questions were answered and the patient acknowledges these risk and wishes to proceed. - Order CT scan of abdomen and pelvis, chest for cancer surveillance and to assess liver function, mets - check CEA - refer back to oncology   Elevated LFTs AST 39 Negative hepatitis A, B and C Daily drinking Gin and tonic for years Likely ETOH related but need to rule out mets/cirrhosis -Will get CT AB and pelvis with contrast -Check hepatocellular work up and GGT -ETOH cessation discussed  Pulmonary hypertension Seen on CT  chest 2022 No smoking Repeat CT chest for cancer screening Consider OSA study  I have reviewed the clinic note as outlined by Alan Coombs, PA and agree with the assessment, plan and medical decision making.  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  Did not have follow-up due to loss of insurance.  Now presents with 6 months of diarrhea and also noted to have elevated ALT.  Agree with laboratory workup for causes of diarrhea.  Appropriate to proceed with colonoscopy in light of colorectal cancer history and GI symptoms.  Elevated AST most likely related to daily alcohol use but can perform a limited chronic hepatitis B workup.  Also consider checking a CK in the future as elevated AST can be related to myopathic issues.  [name], MD    Patient Care Team: Dow Ozell PARAS, PA as PCP - General (Physician Assistant) Burton, [name] K, NP as Nurse Practitioner (Oncology) Lanny Callander, MD as Consulting Physician (Hematology and Oncology) Teresa Lonni HERO, MD as Consulting Physician (General Surgery) Tamela Maudlin, MD as Referring Physician (Gastroenterology)  HISTORY OF PRESENT ILLNESS: 63 y.o. male with a past medical history listed below presents for evaluation of diarrhea.   Discussed the use of AI scribe software for clinical note transcription with the patient, who gave verbal consent to proceed.  History of Present Illness   Andrew Hines is a 63 year old male with ascending colon cancer who presents for follow-up regarding cancer surveillance and gastrointestinal symptoms.  He was diagnosed with ascending colon cancer in 2022  and underwent a right hemicolectomy in May 2022, followed by chemotherapy. He has not had regular follow-up colonoscopies and CT scans due to lack of health insurance. He was informed  during this visit that two out of nineteen lymph nodes were positive at the time of his surgery, which he was previously unaware of.  He consumes alcohol daily, specifically gin and juice, which he has been doing for many years. His liver function tests are slightly elevated. He has not experienced significant weight loss, abdominal pain, or changes in appetite, but has persistent diarrhea for the past six months, occurring once or twice daily, sometimes with urgency after eating certain foods.  He has a history of sleep disturbances, stating he has not slept well for many years and often wakes up at 3 AM, unable to return to sleep. He does not use a CPAP machine and has not been diagnosed with sleep apnea, but struggles with insomnia and has tried various sleep aids in the past, including trazodone, which caused night terrors.  No tobacco use. He has a history of hypertension, for which he takes hydrochlorothiazide 25 mg and amlodipine 5 mg. He also takes a low-dose cholesterol medication, which he stopped due to stomach discomfort. He has not taken any antibiotics recently and had COVID-19 in 2020 and 2021.  He reports a recent episode of joint pain, possibly gout, for which he was treated with a medication he cannot recall, but it was not prednisone. He is scheduled for a follow-up appointment to further evaluate this issue.      He  reports that he quit smoking about 32 years ago. He started smoking about 37 years ago. He has a 5 pack-year smoking history. He has never used smokeless tobacco. He reports current alcohol use. He reports that he does not use drugs.  RELEVANT GI HISTORY, IMAGING AND LABS: Results   LABS AST: elevated  RADIOLOGY Chest CT: possible pulmonary hypertension      CBC    Component Value Date/Time   WBC 3.8 (L) 09/24/2020 0903   WBC 5.8 06/17/2020 0752   RBC 4.61 09/24/2020 0903   HGB 13.8 09/24/2020 0903   HCT 40.2 09/24/2020 0903   PLT 198 09/24/2020 0903    MCV 87.2 09/24/2020 0903   MCH 29.9 09/24/2020 0903   MCHC 34.3 09/24/2020 0903   RDW 24.6 (H) 09/24/2020 0903   LYMPHSABS 1.6 09/24/2020 0903   MONOABS 0.7 09/24/2020 0903   EOSABS 0.1 09/24/2020 0903   BASOSABS 0.0 09/24/2020 0903   No results for input(s): HGB in the last 8760 hours.  CMP     Component Value Date/Time   NA 141 09/24/2020 0903   K 4.0 09/24/2020 0903   CL 109 09/24/2020 0903   CO2 21 (L) 09/24/2020 0903   GLUCOSE 101 (H) 09/24/2020 0903   BUN 12 09/24/2020 0903   CREATININE 1.09 09/24/2020 0903   CALCIUM 9.0 09/24/2020 0903   PROT 6.9 09/24/2020 0903   ALBUMIN 3.5 09/24/2020 0903   AST 29 09/24/2020 0903   ALT 18 09/24/2020 0903   ALKPHOS 100 09/24/2020 0903   BILITOT 0.5 09/24/2020 0903   GFRNONAA >60 09/24/2020 0903      Latest Ref Rng & Units 09/24/2020    9:03 AM 09/03/2020    8:59 AM 08/15/2020    8:28 AM  Hepatic Function  Total Protein 6.5 - 8.1 g/dL 6.9  7.2  7.1   Albumin 3.5 - 5.0 g/dL 3.5  3.6  3.4  AST 15 - 41 U/L 29  34  23   ALT 0 - 44 U/L 18  18  12    Alk Phosphatase 38 - 126 U/L 100  94  85   Total Bilirubin 0.3 - 1.2 mg/dL 0.5  0.5  0.4       Current Medications:    Current Outpatient Medications (Cardiovascular):    amLODipine (NORVASC) 5 MG tablet, Take 5 mg by mouth daily.   hydrochlorothiazide (HYDRODIURIL) 25 MG tablet, Take 25 mg by mouth daily.   sildenafil (VIAGRA) 100 MG tablet, Take 100 mg by mouth daily as needed for erectile dysfunction. (Patient not taking: Reported on 11/27/2023)     Current Outpatient Medications (Other):    diphenhydrAMINE  HCl, Sleep, 50 MG CAPS, Take 100 mg by mouth at bedtime.   Tetrahydrozoline HCl (VISINE OP), Place 1 drop into both eyes daily.   capecitabine  (XELODA ) 500 MG tablet, Take 3 tabs in morning and 4 tabs in evening for 14 days then off 7 days. Repeat every 21 days. Take within 30 minutes after meals. (Patient not taking: Reported on 11/27/2023)   Cholecalciferol (VITAMIN D)  50 MCG (2000 UT) tablet, Take 2,000 Units by mouth daily. (Patient not taking: Reported on 11/27/2023)   ondansetron  (ZOFRAN ) 8 MG tablet, Take 1 tablet (8 mg total) by mouth 2 (two) times daily as needed for refractory nausea / vomiting. Start on day 3 after chemotherapy. (Patient not taking: Reported on 11/27/2023)   prochlorperazine  (COMPAZINE ) 10 MG tablet, Take 1 tablet (10 mg total) by mouth every 6 (six) hours as needed (Nausea or vomiting). (Patient not taking: Reported on 11/27/2023)  Medical History:  Past Medical History:  Diagnosis Date   Cancer (HCC)    colon cancer, basal cell    HBP (high blood pressure) 2025   Pneumonia    2020    Allergies: No Known Allergies   Surgical History:  He  has a past surgical history that includes Appendectomy and Laparoscopic right hemi colectomy (N/A, 06/13/2020). Family History:  His family history includes Cancer in his cousin, father, paternal uncle, paternal uncle, paternal uncle, paternal uncle, paternal uncle, and paternal uncle.  REVIEW OF SYSTEMS  : All other systems reviewed and negative except where noted in the History of Present Illness.  PHYSICAL EXAM: BP 128/74   Pulse 79   Ht 6' (1.829 m)   Wt 200 lb 6 oz (90.9 kg)   BMI 27.18 kg/m  Physical Exam   GENERAL APPEARANCE: Well nourished, in no apparent distress. HEENT: No cervical lymphadenopathy, unremarkable thyroid, sclerae anicteric, conjunctiva pink. RESPIRATORY: Respiratory effort normal, breath sounds equal bilaterally without rales, rhonchi, or wheezing. Lungs clear to auscultation bilaterally. CARDIO: Regular rate and rhythm with no murmurs, rubs, or gallops, peripheral pulses intact. ABDOMEN: Soft, non-distended, active bowel sounds in all four quadrants, no tenderness to palpation, no rebound, no mass appreciated. RECTAL: Declines. MUSCULOSKELETAL: Full range of motion, normal gait, without edema. SKIN: Dry, intact without rashes or lesions. No jaundice. NEURO:  Alert, oriented, no focal deficits. PSYCH: Cooperative, normal mood and affect.      Alan JONELLE Coombs, PA-C 2:26 PM

## 2023-11-27 NOTE — Patient Instructions (Signed)
 Your provider has requested that you go to the basement level for lab work before leaving today. Press B on the elevator. The lab is located at the first door on the left as you exit the elevator.  Ask about trazadone, consider sleep study  FIBER SUPPLEMENT You can do metamucil or fibercon once or twice a day but if this causes gas/bloating please switch to Benefiber or Citracel.  Fiber is good for constipation/diarrhea/irritable bowel syndrome.  It can also help with weight loss and can help lower your bad cholesterol (LDL).  Please do 1 TBSP in the morning in water, coffee, or tea.  It can take up to a month before you can see a difference with your bowel movements.  It is cheapest from [employer], [employer], [employer].    VISIT SUMMARY:  During your visit, we discussed your follow-up care for ascending colon cancer, chronic diarrhea, elevated liver function tests, alcohol use, joint pain, and insomnia. We have outlined a plan to address each of these issues.  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 -Schedule a colonoscopy. -Order a CT scan of the abdomen and pelvis for cancer surveillance and to assess liver function.  CHRONIC DIARRHEA: You have been experiencing chronic diarrhea for the past six months, which could be due to various causes such as IBS, dietary factors, or partial obstruction. -Recommend using Benefiber for diarrhea management. -Provide a FODMAP diet plan for IBS. -Order a CT scan of the abdomen to rule out obstruction or other causes. -Order labs to check for celiac disease and repeat CEA.  ABNORMAL LIVER FUNCTION TESTS LIKELY SECONDARY TO ALCOHOL USE: Your liver function tests are slightly elevated, likely due to chronic alcohol use. We need to rule out liver cirrhosis or metastasis from colon cancer. -Order a CT scan of the abdomen to  assess liver. -Advise reducing alcohol intake.  ALCOHOL USE DISORDER: Chronic daily alcohol use is contributing to elevated liver function tests and potentially affecting your sleep quality. Alcohol also increases the risk of cancer. -Advise reducing alcohol intake. -Discuss the potential for alcohol-induced insomnia and its impact on sleep quality.  MONOARTICULAR ARTHRITIS, POSSIBLE GOUT OR PSEUDOGOUT: You have intermittent joint pain, possibly gout, which may be exacerbated by alcohol use. -Follow up with an appointment for further evaluation of arthritis. -Advise reducing alcohol intake to decrease gout risk.  INSOMNIA: You have chronic insomnia with difficulty maintaining sleep, potentially exacerbated by alcohol use. Previous sleep aids caused night terrors. -Discuss the potential use of trazodone with your primary care provider. -Consider a sleep study to evaluate for sleep apnea.  You have been scheduled for a colonoscopy. Please follow written instructions given to you at your visit today.   If you use inhalers (even only as needed), please bring them with you on the day of your procedure.  DO NOT TAKE 7 DAYS PRIOR TO TEST- Trulicity (dulaglutide) Ozempic, Wegovy (semaglutide) Mounjaro (tirzepatide) Bydureon Bcise (exanatide extended release)  DO NOT TAKE 1 DAY PRIOR TO YOUR TEST Rybelsus (semaglutide) Adlyxin (lixisenatide) Victoza (liraglutide) Byetta (exanatide) ___________________________________________________________________________  Due to recent changes in healthcare laws, you may see the results of your imaging and laboratory studies on MyChart before your provider has had a chance to review them.  We understand that in some cases there may be results that are confusing or concerning to you. Not all laboratory results come back in the  same time frame and the provider may be waiting for multiple results in order to interpret others.  Please give us  48 hours in order  for your provider to thoroughly review all the results before contacting the office for clarification of your results.    Your CT Scan is scheduled on 10/21 at 3:30 pm at [facility], check in through the emergency room. Arrive at 3:30pm. You test is at 5:30pm  I appreciate the  opportunity to care for you  Thank You   [facility]

## 2023-11-30 ENCOUNTER — Telehealth: Payer: Self-pay | Admitting: Physician Assistant

## 2023-11-30 NOTE — Telephone Encounter (Signed)
 Patient returning call Requesting a call back  Please advise  Thank you

## 2023-12-01 ENCOUNTER — Ambulatory Visit (HOSPITAL_COMMUNITY)

## 2023-12-01 NOTE — Telephone Encounter (Signed)
 Spoke with patient, see 10/17 result note.

## 2023-12-03 LAB — ANTI-SMOOTH MUSCLE ANTIBODY, IGG: Actin (Smooth Muscle) Antibody (IGG): <20 U (ref <20)

## 2023-12-03 LAB — MITOCHONDRIAL ANTIBODIES: Mitochondrial M2 Ab, IgG: <=20 U (ref <=20.0)

## 2023-12-03 LAB — CEA: CEA: 10 ng/mL — ABNORMAL HIGH

## 2023-12-03 LAB — ANA: Anti Nuclear Antibody (ANA): NEGATIVE

## 2023-12-03 LAB — IGG: IgG (Immunoglobin G), Serum: 1991 mg/dL — ABNORMAL HIGH (ref 600–1540)

## 2023-12-03 LAB — TISSUE TRANSGLUTAMINASE, IGA: (tTG) Ab, IgA: <1 U/mL

## 2023-12-03 LAB — IGA: Immunoglobulin A: 318 mg/dL (ref 70–320)

## 2023-12-04 ENCOUNTER — Ambulatory Visit (HOSPITAL_COMMUNITY)
Admission: RE | Admit: 2023-12-04 | Discharge: 2023-12-04 | Disposition: A | Discharge status: Home / Self Care | Source: Ambulatory Visit | Attending: Physician Assistant | Admitting: Physician Assistant

## 2023-12-04 DIAGNOSIS — C182 Malignant neoplasm of ascending colon: Secondary | ICD-10-CM | POA: Insufficient documentation

## 2023-12-04 DIAGNOSIS — R197 Diarrhea, unspecified: Secondary | ICD-10-CM | POA: Diagnosis present

## 2023-12-04 DIAGNOSIS — R7989 Other specified abnormal findings of blood chemistry: Secondary | ICD-10-CM | POA: Insufficient documentation

## 2023-12-04 DIAGNOSIS — Z9049 Acquired absence of other specified parts of digestive tract: Secondary | ICD-10-CM | POA: Diagnosis present

## 2023-12-04 MED ORDER — IOHEXOL 300 MG/ML  SOLN
100.0000 mL | Freq: Once | INTRAMUSCULAR | Status: AC | PRN
  Administered 2023-12-04: 100 mL via INTRAVENOUS

## 2023-12-04 MED ORDER — IOHEXOL 9 MG/ML PO SOLN
ORAL | Status: AC
  Filled 2023-12-04: qty 1000

## 2023-12-04 MED ORDER — IOHEXOL 9 MG/ML PO SOLN
1000.0000 mL | ORAL | Status: AC
  Administered 2023-12-04: 1000 mL via ORAL

## 2023-12-08 NOTE — Telephone Encounter (Signed)
 Inbound call from patient stating that he spoke with Alan and wanted her to call his daughter and wanted to make sure he can gave the right number, he provided Morna  [phone].

## 2023-12-08 NOTE — Telephone Encounter (Signed)
 Called daughter and spoke with her, she is pharmacist. She will talk with her husband who is cardiac ICU and get back in touch with us .

## 2023-12-09 ENCOUNTER — Other Ambulatory Visit

## 2023-12-09 ENCOUNTER — Ambulatory Visit: Payer: Self-pay | Admitting: Physician Assistant

## 2023-12-09 DIAGNOSIS — C182 Malignant neoplasm of ascending colon: Secondary | ICD-10-CM

## 2023-12-09 DIAGNOSIS — E876 Hypokalemia: Secondary | ICD-10-CM

## 2023-12-09 DIAGNOSIS — R7989 Other specified abnormal findings of blood chemistry: Secondary | ICD-10-CM

## 2023-12-09 LAB — COMPREHENSIVE METABOLIC PANEL WITH GFR
ALT: 20 U/L (ref 0–53)
AST: 49 U/L — ABNORMAL HIGH (ref 0–37)
Albumin: 4.1 g/dL (ref 3.5–5.2)
Alkaline Phosphatase: 99 U/L (ref 39–117)
BUN: 15 mg/dL (ref 6–23)
CO2: 30 meq/L (ref 19–32)
Calcium: 9.3 mg/dL (ref 8.4–10.5)
Chloride: 96 meq/L (ref 96–112)
Creatinine, Ser: 1.45 mg/dL (ref 0.40–1.50)
GFR: 50.56 mL/min — ABNORMAL LOW (ref >60.00)
Glucose, Bld: 103 mg/dL — ABNORMAL HIGH (ref 70–99)
Potassium: 3.2 meq/L — ABNORMAL LOW (ref 3.5–5.1)
Sodium: 136 meq/L (ref 135–145)
Total Bilirubin: 0.8 mg/dL (ref 0.2–1.2)
Total Protein: 8.2 g/dL (ref 6.0–8.3)

## 2023-12-11 LAB — AFP TUMOR MARKER: AFP-Tumor Marker: 3.9 ng/mL (ref <6.1)

## 2023-12-11 NOTE — Telephone Encounter (Signed)
 Attempted to reach someone at the [facility] again with no success. The provider line just rings for minutes, I was not able to speak with anyone. Will continue efforts.

## 2023-12-11 NOTE — Telephone Encounter (Signed)
 Called [facility] to follow up on urgent referral that was placed on 12/08/23. I had to leave a vm for the referral coordinator. I attempted to reach GI Oncology Navigator, Evalene Rich, RN but she is out of the office until 12/14/23. Requested that someone reach out to patient ASAP to schedule. Provided main office phone number & my direct office number.

## 2023-12-14 NOTE — Telephone Encounter (Signed)
 Spoke with Kristi, CHARITY FUNDRAISER at [employer]. She will reach out to scheduler & have someone contact patient to schedule.

## 2023-12-15 ENCOUNTER — Encounter: Payer: Self-pay | Admitting: Pediatrics

## 2023-12-17 ENCOUNTER — Inpatient Hospital Stay

## 2023-12-17 ENCOUNTER — Inpatient Hospital Stay: Attending: Hematology | Admitting: Hematology

## 2023-12-17 ENCOUNTER — Encounter: Payer: Self-pay | Admitting: Radiology

## 2023-12-17 ENCOUNTER — Encounter: Payer: Self-pay | Admitting: Hematology

## 2023-12-17 VITALS — BP 128/80 | HR 78 | Temp 98.0°F | Resp 16 | Ht 72.0 in | Wt 197.6 lb

## 2023-12-17 DIAGNOSIS — R1900 Intra-abdominal and pelvic swelling, mass and lump, unspecified site: Secondary | ICD-10-CM | POA: Insufficient documentation

## 2023-12-17 DIAGNOSIS — Z85828 Personal history of other malignant neoplasm of skin: Secondary | ICD-10-CM | POA: Insufficient documentation

## 2023-12-17 DIAGNOSIS — I10 Essential (primary) hypertension: Secondary | ICD-10-CM | POA: Diagnosis not present

## 2023-12-17 DIAGNOSIS — I2510 Atherosclerotic heart disease of native coronary artery without angina pectoris: Secondary | ICD-10-CM | POA: Insufficient documentation

## 2023-12-17 DIAGNOSIS — Z800 Family history of malignant neoplasm of digestive organs: Secondary | ICD-10-CM | POA: Insufficient documentation

## 2023-12-17 DIAGNOSIS — I700 Atherosclerosis of aorta: Secondary | ICD-10-CM | POA: Insufficient documentation

## 2023-12-17 DIAGNOSIS — Z8042 Family history of malignant neoplasm of prostate: Secondary | ICD-10-CM | POA: Diagnosis not present

## 2023-12-17 DIAGNOSIS — R197 Diarrhea, unspecified: Secondary | ICD-10-CM | POA: Insufficient documentation

## 2023-12-17 DIAGNOSIS — K7689 Other specified diseases of liver: Secondary | ICD-10-CM | POA: Diagnosis not present

## 2023-12-17 DIAGNOSIS — Z801 Family history of malignant neoplasm of trachea, bronchus and lung: Secondary | ICD-10-CM | POA: Diagnosis not present

## 2023-12-17 DIAGNOSIS — R918 Other nonspecific abnormal finding of lung field: Secondary | ICD-10-CM | POA: Insufficient documentation

## 2023-12-17 DIAGNOSIS — Z87891 Personal history of nicotine dependence: Secondary | ICD-10-CM | POA: Diagnosis not present

## 2023-12-17 DIAGNOSIS — K4090 Unilateral inguinal hernia, without obstruction or gangrene, not specified as recurrent: Secondary | ICD-10-CM | POA: Insufficient documentation

## 2023-12-17 DIAGNOSIS — G620 Drug-induced polyneuropathy: Secondary | ICD-10-CM | POA: Diagnosis not present

## 2023-12-17 DIAGNOSIS — Z8701 Personal history of pneumonia (recurrent): Secondary | ICD-10-CM | POA: Diagnosis not present

## 2023-12-17 DIAGNOSIS — C182 Malignant neoplasm of ascending colon: Secondary | ICD-10-CM | POA: Insufficient documentation

## 2023-12-17 DIAGNOSIS — T451X5A Adverse effect of antineoplastic and immunosuppressive drugs, initial encounter: Secondary | ICD-10-CM | POA: Insufficient documentation

## 2023-12-17 DIAGNOSIS — Z9221 Personal history of antineoplastic chemotherapy: Secondary | ICD-10-CM | POA: Diagnosis not present

## 2023-12-17 DIAGNOSIS — K760 Fatty (change of) liver, not elsewhere classified: Secondary | ICD-10-CM | POA: Insufficient documentation

## 2023-12-17 DIAGNOSIS — Z79899 Other long term (current) drug therapy: Secondary | ICD-10-CM | POA: Diagnosis not present

## 2023-12-17 DIAGNOSIS — Z85038 Personal history of other malignant neoplasm of large intestine: Secondary | ICD-10-CM | POA: Diagnosis not present

## 2023-12-17 NOTE — Progress Notes (Signed)
 [facility]   [phone] Fax:([phone]) [phone]   Clinic New Consult Note   Patient Care Team: Dow Ozell PARAS, PA as PCP - General (Physician Assistant) Ann Mayme POUR, NP as Nurse Practitioner (Oncology) Lanny Callander, MD as Consulting Physician (Hematology and Oncology) Teresa Lonni HERO, MD as Consulting Physician (General Surgery) Tamela Maudlin, MD as Referring Physician (Gastroenterology) Ardis Evalene CROME, RN as Oncology Nurse Navigator 12/17/2023  CHIEF COMPLAINTS/PURPOSE OF CONSULTATION:  Abdominal mass and lung nodules, concerning for metastatic recurrence of colon cancer.  REFERRING PHYSICIAN: [facility]  Discussed the use of AI scribe software for clinical note transcription with the patient, who gave verbal consent to proceed.  History of Present Illness Andrew Hines is a 63 year old male with history of stage three colon cancer who presents for a new consult for probable recurrent colon cancer.  He completed chemotherapy for stage three colon cancer in August 2022. Follow-up care was disrupted due to loss of insurance. A recent CT scan shows a 5.6 cm mass in the mesentery, enlarged lymph nodes, and multiple pulmonary nodules.  He experiences diarrhea for the past six months, occurring up to three times daily, with occasional softer stools. There is no abdominal pain, weight loss, blood in the stool, nausea, or vomiting. Energy levels remain normal, and he maintains a good appetite.  Family history includes lung cancer in his father and prostate or colon cancer in multiple paternal uncles. Some uncles also had cirrhosis of the liver related to alcohol use.  He has reduced alcohol intake to two drinks daily and quit smoking in 1993. He is retired, living with a friend, and has three children.     MEDICAL HISTORY:  Past Medical History:  Diagnosis Date   Cancer (HCC)    colon cancer, basal cell    HBP (high blood pressure) 2025    Pneumonia    2020     SURGICAL HISTORY: Past Surgical History:  Procedure Laterality Date   APPENDECTOMY     LAPAROSCOPIC RIGHT HEMI COLECTOMY N/A 06/13/2020   Procedure: LAPAROSCOPIC RIGHT HEMI COLECTOMY AND PARTIAL OMENTECTOMY WITH LYSIS OF ADHESIONS;  Surgeon: Teresa Lonni HERO, MD;  Location: [facility];  Service: General;  Laterality: N/A;    SOCIAL HISTORY: Social History   Socioeconomic History   Marital status: Significant Other    Spouse name: Not on file   Number of children: 3   Years of education: Not on file   Highest education level: Not on file  Occupational History   Not on file  Tobacco Use   Smoking status: Former    Current packs/day: 0.00    Average packs/day: 1 pack/day for 5.0 years (5.0 ttl pk-yrs)    Types: Cigarettes    Start date: 37    Quit date: 53    Years since quitting: 32.8   Smokeless tobacco: Never  Vaping Use   Vaping status: Never Used  Substance and Sexual Activity   Alcohol use: Yes    Alcohol/week: 14.0 standard drinks of alcohol    Types: 14 Shots of liquor per week    Comment: gin daily x25 years, cut down to 2 drinks a day   Drug use: Never   Sexual activity: Yes  Other Topics Concern   Not on file  Social History Narrative   Lives alone, has significant other and sibling support. 3 adult children (1 son has Crohn's). Daughter's mother had breast cancer (pt's daughter reportedly negative genetic testing). 6  paternal uncles had either prostate or colon cancers, details kept private in the family. 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     Social Drivers of Corporate Investment Banker Strain: Not on file  Food Insecurity: No Food Insecurity (12/17/2023)   Hunger Vital Sign    Worried About Running Out of Food in the Last Year: Never true    Ran Out of Food in the Last Year: Never true  Transportation Needs: No Transportation Needs (12/17/2023)   PRAPARE - Scientist, Research (physical Sciences) (Medical): No    Lack of Transportation (Non-Medical): No  Physical Activity: Not on file  Stress: Not on file  Social Connections: Unknown (06/21/2021)   Received from [facility]   Social Network    Social Network: Not on file  Intimate Partner Violence: Not At Risk (12/17/2023)   Humiliation, Afraid, Rape, and Kick questionnaire    Fear of Current or Ex-Partner: No    Emotionally Abused: No    Physically Abused: No    Sexually Abused: No    FAMILY HISTORY: Family History  Problem Relation Age of Onset   Cancer Father        lung   Cancer Paternal Uncle    Cancer Cousin        paternal, lung   Cancer Paternal Uncle    Cancer Paternal Uncle    Cancer Paternal Uncle    Cancer Paternal Uncle    Cancer Paternal Uncle     ALLERGIES:  has no known allergies.  MEDICATIONS:  Current Outpatient Medications  Medication Sig Dispense Refill   amLODipine (NORVASC) 5 MG tablet Take 5 mg by mouth daily.     hydrochlorothiazide (HYDRODIURIL) 25 MG tablet Take 25 mg by mouth daily.     potassium chloride SA (KLOR-CON M) 20 MEQ tablet Take 2 tablets (40 mEq total) by mouth daily for 3 days, THEN 1 tablet (20 mEq total) daily for 3 days. 30 tablet 1   capecitabine  (XELODA ) 500 MG tablet Take 3 tabs in morning and 4 tabs in evening for 14 days then off 7 days. Repeat every 21 days. Take within 30 minutes after meals. (Patient not taking: Reported on 11/27/2023) 84 tablet 0   Cholecalciferol (VITAMIN D) 50 MCG (2000 UT) tablet Take 2,000 Units by mouth daily. (Patient not taking: Reported on 11/27/2023)     diphenhydrAMINE  HCl, Sleep, 50 MG CAPS Take 100 mg by mouth at bedtime. (Patient not taking: Reported on 12/17/2023)     ondansetron  (ZOFRAN ) 8 MG tablet Take 1 tablet (8 mg total) by mouth 2 (two) times daily as needed for refractory nausea / vomiting. Start on day 3 after chemotherapy. (Patient not taking: Reported on 11/27/2023) 30 tablet 1   prochlorperazine  (COMPAZINE )  10 MG tablet Take 1 tablet (10 mg total) by mouth every 6 (six) hours as needed (Nausea or vomiting). (Patient not taking: Reported on 11/27/2023) 30 tablet 1   sildenafil (VIAGRA) 100 MG tablet Take 100 mg by mouth daily as needed for erectile dysfunction. (Patient not taking: Reported on 11/27/2023)     Tetrahydrozoline HCl (VISINE OP) Place 1 drop into both eyes daily.     No current facility-administered medications for this visit.    REVIEW OF SYSTEMS:   Constitutional: Denies fevers, chills or abnormal night sweats Eyes: Denies blurriness of vision, double vision or watery eyes Ears, nose, mouth, throat, and face: Denies mucositis or sore throat  Respiratory: Denies cough, dyspnea or wheezes Cardiovascular: Denies palpitation, chest discomfort or lower extremity swelling Gastrointestinal:  Denies nausea, heartburn or change in bowel habits Skin: Denies abnormal skin rashes Lymphatics: Denies new lymphadenopathy or easy bruising Neurological:Denies numbness, tingling or new weaknesses Behavioral/Psych: Mood is stable, no new changes  All other systems were reviewed with the patient and are negative.  PHYSICAL EXAMINATION: ECOG PERFORMANCE STATUS: 0 - Asymptomatic  Vitals:   12/17/23 1400  BP: 128/80  Pulse: 78  Resp: 16  Temp: 98 F (36.7 C)  SpO2: 99%   Filed Weights   12/17/23 1400  Weight: 197 lb 9.6 oz (89.6 kg)    GENERAL:alert, no distress and comfortable SKIN: skin color, texture, turgor are normal, no rashes or significant lesions EYES: normal, conjunctiva are pink and non-injected, sclera clear OROPHARYNX:no exudate, no erythema and lips, buccal mucosa, and tongue normal  NECK: supple, thyroid normal size, non-tender, without nodularity LYMPH:  no palpable lymphadenopathy in the cervical, axillary or inguinal LUNGS: clear to auscultation and percussion with normal breathing effort HEART: regular rate & rhythm and no murmurs and no lower extremity  edema ABDOMEN:abdomen soft, non-tender and normal bowel sounds Musculoskeletal:no cyanosis of digits and no clubbing  PSYCH: alert & oriented x 3 with fluent speech NEURO: no focal motor/sensory deficits  Physical Exam   LABORATORY DATA:  I have reviewed the data as listed    Latest Ref Rng & Units 11/27/2023    2:51 PM 09/24/2020    9:03 AM 09/03/2020    8:59 AM  CBC  WBC 4.0 - 10.5 K/uL 6.9  3.8  3.6   Hemoglobin 13.0 - 17.0 g/dL 84.3  86.1  85.9   Hematocrit 39.0 - 52.0 % 45.4  40.2  41.2   Platelets 150.0 - 400.0 K/uL 373.0  198  242     @cmpl @  RADIOGRAPHIC STUDIES: I have personally reviewed the radiological images as listed and agreed with the findings in the report. CT CHEST ABDOMEN PELVIS W CONTRAST Result Date: 12/07/2023 CLINICAL DATA:  History of colon cancer status post right hemicolectomy and chemotherapy, elevated LFTs. Restaging. * Tracking Code: BO * EXAM: CT CHEST, ABDOMEN, AND PELVIS WITH CONTRAST TECHNIQUE: Multidetector CT imaging of the chest, abdomen and pelvis was performed following the standard protocol during bolus administration of intravenous contrast. RADIATION DOSE REDUCTION: This exam was performed according to the departmental dose-optimization program which includes automated exposure control, adjustment of the mA and/or kV according to patient size and/or use of iterative reconstruction technique. CONTRAST:  OMNIPAQUE IOHEXOL 300 MG/ML  SOLN COMPARISON:  CT June 04, 2020 FINDINGS: CT CHEST FINDINGS Cardiovascular: Normal caliber thoracic aorta. Normal size heart. No significant pericardial effusion/thickening. Mediastinum/Nodes: No suspicious thyroid nodule. No pathologically enlarged mediastinal, hilar or axillary lymph nodes. The esophagus is grossly unremarkable. Lungs/Pleura: Bilateral pulmonary nodules.  For reference: -3 mm nodule in the left lung apex on image 17/4. -5 mm nodule in the right middle lobe on image 85/4 -11 mm nodule in the  left lower lobe on image 102/4. Musculoskeletal: No aggressive lytic or blastic lesion of bone. CT ABDOMEN PELVIS FINDINGS Hepatobiliary: Hypodense lesion in the right lobe of the liver measures 11 mm on image 50/2 previously 10 mm, stability is compatible with a benign finding. Diffuse hepatic steatosis. Hyperdense wedge-shaped area near the gallbladder fossa on image 57/2. Gallbladder is unremarkable.  No biliary ductal dilation. Pancreas: No pancreatic ductal dilation or evidence of acute inflammation. Spleen: No splenomegaly. Adrenals/Urinary Tract: Bilateral  adrenal glands appear normal. No hydronephrosis. Kidneys demonstrate symmetric enhancement. Urinary bladder is unremarkable for degree of distension. Stomach/Bowel: Radiopaque enteric contrast material traverses the splenic flexure. Stomach is unremarkable for degree of distension. No pathologic dilation of small or large bowel. Prior right hemicolectomy with ileocolic anastomosis. Vascular/Lymphatic: Aortic atherosclerosis. Normal caliber abdominal aorta. Heterogeneously enhancing mass versus lymph node in the central mesentery measures 5.6 x 4.2 x 6.0 cm on image 54/2 and image 57/5. Additional adjacent prominent mesenteric lymph nodes for instance measuring 5 mm in short axis on image 76/5. Reproductive: Prostate is unremarkable. Other: Fat containing inguinal hernias. No significant abdominopelvic free fluid. Musculoskeletal: No aggressive lytic or blastic lesion of bone. IMPRESSION: 1. Heterogeneously enhancing mass versus lymph node in the central mesentery measures 5.6 x 4.2 cm, compatible with metastatic disease. 2. Adjacent prominent mesenteric lymph nodes suspicious for additional nodal disease involvement. 3. Bilateral pulmonary nodules measuring up to 11 mm, suspicious for metastatic disease. 4. Diffuse hepatic steatosis with a hyperdense wedge-shaped area near the gallbladder fossa, favored to reflect focal fatty sparing. Consider more  definitive characterization by abdominal MRI with and without contrast. 5. Aortic atherosclerosis. Aortic Atherosclerosis (ICD10-I70.0). Electronically Signed   By: Reyes Holder M.D.   On: 12/07/2023 12:52    Assessment & Plan Suspicion for recurrent colon cancer with intra-abdominal mass, and pulmonary nodules CT scan reveals a 5.6 cm mass in the mesentery and enlarged lymph nodes, suspicious for recurrent colon cancer. Multiple pulmonary nodules are also suspicious for malignancy. Differential includes new primary cancer. Tissue diagnosis is necessary to confirm malignancy and guide treatment. If confirmed, chemotherapy is likely. - Scheduled colonoscopy on November 11th, 2025. - Requested IR CT biopsy of abdominal mass. - Ordered PET scan to guide biopsy if abdominal mass cannot be biopsied. - If PET scan is inconclusive, will refer to pulmonologist for lung nodule biopsy. - Will schedule follow-up appointment three days after biopsy results.  Chronic diarrhea Six months, occurring once to three times daily. No associated weight loss, blood in stool, or abdominal pain. Initial suspicion of food intolerance. Diarrhea may be related to suspected recurrent colon cancer.  Essential hypertension Hypertension is well-controlled with current management. - Continue current antihypertensive regimen.  Plan - I personally reviewed his recent CT scan images and discussed the findings with patient and his daughter in detail.  This is highly concerning for pulmonary and abdominal recurrence of colon cancer. - I reach out to IR about the biopsy, unfortunately his abdominal mass is not feasible for percutaneous needle biopsy. - Will obtain PET scan to see if there are any other metastatic lesion for biopsy. - Will reach out to pulmonary to see if they can biopsy his pulmonary nodules. - Follow-up after the above workup.   Orders Placed This Encounter  Procedures   CT Biopsy    Standing Status:    Future    Expected Date:   12/24/2023    Expiration Date:   12/16/2024    Lab orders requested (DO NOT place separate lab orders, these will be automatically ordered during procedure specimen collection)::   Surgical Pathology    Reason for Exam (SYMPTOM  OR DIAGNOSIS REQUIRED):   Biopsy of the abdominal mass to confirm malignancy    Preferred location?:   [facility]   NM PET Image Initial (PI) Skull Base To Thigh (F-18 FDG)    Standing Status:   Future    Expected Date:   12/24/2023    Expiration Date:  12/16/2024    If indicated for the ordered procedure, I authorize the administration of a radiopharmaceutical per Radiology protocol:   Yes    Preferred imaging location?:   [facility]   Ambulatory referral to Genetics    Referral Priority:   Routine    Referral Type:   Consultation    Referral Reason:   Specialty Services Required    Number of Visits Requested:   1    All questions were answered. The patient knows to call the clinic with any problems, questions or concerns. I spent 40 minutes counseling the patient face to face. The total time spent in the appointment was 45 minutes including review of chart and various tests results, discussions about plan of care and coordination of care plan.     Onita Mattock, MD 12/17/2023 5:32 PM

## 2023-12-17 NOTE — Progress Notes (Signed)
 [name], MD  Daralene Ferol FALCON, RT Sure       Previous Messages    ----- Message ----- From: Daralene Ferol FALCON, RT Sent: 12/17/2023   4:16 PM EST To: Aran Luverne, MD Subject: RE: CT Biopsy                                  Would you like me to resubmit for review once PET has been obtained? ----- Message ----- From: [name], MD Sent: 12/17/2023   3:22 PM EST To: Onita Mattock, MD; Ferol FALCON Daralene, RT Subject: RE: CT Biopsy                                  Onita,  I don't think we have a window to the mesenteric mass for percutaneous biopsy, but it is sitting right below the pancreatic head, and EUS approach may be an option. Maybe the PET will show a different site for biopsy.  Aran ----- Message ----- From: Daralene Ferol FALCON, RT Sent: 12/17/2023   3:15 PM EST To: Ir Procedure Requests Subject: CT Biopsy                                      Procedure :CT Biopsy  Reason :Abdominal mass, intra-abdominal neoplasm suspected Dx: Intra-abdominal and pelvic swelling, mass and lump, unspecified site [R19.00 (ICD-10-CM)]  History :CT CHEST ABDOMEN PELVIS W CONTRAST (Accession [id number]) (Order [id number])  Provider:Feng, Onita, MD  Provider contact ; [phone]  PT has PET ordered as well

## 2023-12-17 NOTE — Progress Notes (Signed)
 [name], MD  Daralene Ferol [name]; Lanny Callander, MD Callander,  I don't think we have a window to the mesenteric mass for percutaneous biopsy, but it is sitting right below the pancreatic head, and EUS approach may be an option. Maybe the PET will show a different site for biopsy.  Aran       Previous Messages    ----- Message ----- From: Daralene Ferol JULIANNA, RT Sent: 12/17/2023   3:15 PM EST To: Ir Procedure Requests Subject: CT Biopsy                                      Procedure :CT Biopsy  Reason :Abdominal mass, intra-abdominal neoplasm suspected Dx: Intra-abdominal and pelvic swelling, mass and lump, unspecified site [R19.00 (ICD-10-CM)]  History :CT CHEST ABDOMEN PELVIS W CONTRAST (Accession [id number]) (Order [id number])  Provider:Feng, Callander, MD  Provider contact ; [phone]  PT has PET ordered as well

## 2023-12-17 NOTE — Progress Notes (Signed)
 PATIENT NAVIGATOR PROGRESS NOTE  Name: Andrew Hines Date: 12/17/2023 MRN: [id number]  DOB: 08/19/1960   Reason for visit:  Initial Med/Onc visit with Dr. Onita Mattock  Comments:   Patient was seen during his initial visit with Dr. Mattock.  Patient had been seen in office in the past but was lost to follow-up due to loss of insurance.  SDOH were assessed with no urgent needs noted.   Patient was given my direct contact information and was instructed to contact office with any questions or concerns.  Patient verbalized understanding.    Time spent counseling/coordinating care: 45-60 minutes

## 2023-12-18 ENCOUNTER — Ambulatory Visit (HOSPITAL_BASED_OUTPATIENT_CLINIC_OR_DEPARTMENT_OTHER)
Admission: RE | Admit: 2023-12-18 | Discharge: 2023-12-18 | Disposition: A | Discharge status: Home / Self Care | Source: Ambulatory Visit | Attending: Physician Assistant | Admitting: Physician Assistant

## 2023-12-18 ENCOUNTER — Encounter: Payer: Self-pay | Admitting: Radiology

## 2023-12-18 DIAGNOSIS — R7989 Other specified abnormal findings of blood chemistry: Secondary | ICD-10-CM | POA: Diagnosis present

## 2023-12-18 DIAGNOSIS — K769 Liver disease, unspecified: Secondary | ICD-10-CM | POA: Diagnosis present

## 2023-12-18 MED ORDER — GADOBUTROL 1 MMOL/ML IV SOLN
9.0000 mL | Freq: Once | INTRAVENOUS | Status: AC | PRN
  Administered 2023-12-18: 9 mL via INTRAVENOUS
  Filled 2023-12-18: qty 10

## 2023-12-18 NOTE — Progress Notes (Signed)
 Mansouraty, Aloha Raddle., MD  Lanny Callander, MD; [name], MD; Daralene Ferol FALCON, RT; Ardis Evalene CROME, RN; Suzann Inocente HERO, MD Team, I do not think that I will be able to visualize this area as it seems much more inferior to where I normally am able to get samples of the uncinate and as a result of the scope itself positioning is unlikely to be possible to flex where this appears to be. Sometimes we never know unless we try, but pretest probability here is low that I will be able to reach that area. Also our forward linear is now out of commission completely and that would have been the scope that may have given us  the best opportunity. If no other option is available, we can schedule and try. Let me know. GM       Previous Messages    ----- Message ----- From: Lanny Callander, MD Sent: 12/17/2023   6:10 PM EST To: Aran Luverne, MD; Aloha Wilhelmenia Raddle., * Subject: RE: CT Biopsy                                  Thanks Gleen.  I have cancelled the biopsy request, PET pending.  Gabe, when you have a chance, could you review his CT to see if you can biopsy his mesentery mass through EUS? Thanks much.  Callander ----- Message ----- From: [name], MD Sent: 12/17/2023   3:22 PM EST To: Callander Lanny, MD; Ferol FALCON Daralene, RT Subject: RE: CT Biopsy                                  Callander,  I don't think we have a window to the mesenteric mass for percutaneous biopsy, but it is sitting right below the pancreatic head, and EUS approach may be an option. Maybe the PET will show a different site for biopsy.  Aran ----- Message ----- From: Daralene Ferol FALCON, RT Sent: 12/17/2023   3:15 PM EST To: Ir Procedure Requests Subject: CT Biopsy                                      Procedure :CT Biopsy  Reason :Abdominal mass, intra-abdominal neoplasm suspected Dx: Intra-abdominal and pelvic swelling, mass and lump, unspecified site [R19.00 (ICD-10-CM)]  History :CT CHEST ABDOMEN PELVIS W CONTRAST  (Accession [id number]) (Order [id number])  Provider:Feng, Callander, MD  Provider contact ; [phone]  PT has PET ordered as well

## 2023-12-20 NOTE — Progress Notes (Unsigned)
 [facility] History and Physical   Primary Care Physician:  Dow Ozell PARAS, PA   Reason for Procedure:  History of stage III colorectal cancer status post right hemicolectomy 2022, concern for metastatic disease, diarrhea  Plan:    Colonoscopy   The patient was provided an opportunity to ask questions and all were answered. The patient agreed with the plan.   HPI: Andrew Hines is a 63 y.o. male undergoing colonoscopy for a history of stage III colorectal cancer status post right hemicolectomy 2022, concern for metastatic disease and diarrhea.  Patient was seen in the GI office in October 2025 reporting a 84-month history of diarrhea without melena or hematochezia.  Did not have follow-up after diagnosis of colorectal cancer and surgery due to loss of insurance.  Laboratory studies show elevated CEA 10 and CT imaging with a mesenteric mass concerning for metastatic disease.   Past Medical History:  Diagnosis Date   Cancer (HCC)    colon cancer, basal cell    HBP (high blood pressure) 2025   Pneumonia    2020     Past Surgical History:  Procedure Laterality Date   APPENDECTOMY     LAPAROSCOPIC RIGHT HEMI COLECTOMY N/A 06/13/2020   Procedure: LAPAROSCOPIC RIGHT HEMI COLECTOMY AND PARTIAL OMENTECTOMY WITH LYSIS OF ADHESIONS;  Surgeon: Teresa Lonni HERO, MD;  Location: [facility];  Service: General;  Laterality: N/A;    Prior to Admission medications   Medication Sig Start Date End Date Taking? Authorizing Provider  amLODipine (NORVASC) 5 MG tablet Take 5 mg by mouth daily.    [provider]  capecitabine  (XELODA ) 500 MG tablet Take 3 tabs in morning and 4 tabs in evening for 14 days then off 7 days. Repeat every 21 days. Take within 30 minutes after meals. Patient not taking: Reported on 11/27/2023 09/24/20   Lanny Callander, MD  Cholecalciferol (VITAMIN D) 50 MCG (2000 UT) tablet Take 2,000 Units by mouth daily. Patient not taking: Reported on 11/27/2023     [provider]  diphenhydrAMINE  HCl, Sleep, 50 MG CAPS Take 100 mg by mouth at bedtime. Patient not taking: Reported on 12/17/2023    [provider]  hydrochlorothiazide (HYDRODIURIL) 25 MG tablet Take 25 mg by mouth daily. 09/11/23   [provider]  ondansetron  (ZOFRAN ) 8 MG tablet Take 1 tablet (8 mg total) by mouth 2 (two) times daily as needed for refractory nausea / vomiting. Start on day 3 after chemotherapy. Patient not taking: Reported on 11/27/2023 07/23/20   Lanny Callander, MD  potassium chloride SA (KLOR-CON M) 20 MEQ tablet Take 2 tablets (40 mEq total) by mouth daily for 3 days, THEN 1 tablet (20 mEq total) daily for 3 days. 11/27/23 12/17/23  Craig Alan SAUNDERS, PA-C  prochlorperazine  (COMPAZINE ) 10 MG tablet Take 1 tablet (10 mg total) by mouth every 6 (six) hours as needed (Nausea or vomiting). Patient not taking: Reported on 11/27/2023 07/04/20   Lanny Callander, MD  sildenafil (VIAGRA) 100 MG tablet Take 100 mg by mouth daily as needed for erectile dysfunction. Patient not taking: Reported on 11/27/2023    [provider]  Tetrahydrozoline HCl (VISINE OP) Place 1 drop into both eyes daily.    [provider]    Current Outpatient Medications  Medication Sig Dispense Refill   amLODipine (NORVASC) 5 MG tablet Take 5 mg by mouth daily.     hydrochlorothiazide (HYDRODIURIL) 25 MG tablet Take 25 mg by mouth daily.     Tetrahydrozoline  HCl (VISINE OP) Place 1 drop into both eyes daily.     capecitabine  (XELODA ) 500 MG tablet Take 3 tabs in morning and 4 tabs in evening for 14 days then off 7 days. Repeat every 21 days. Take within 30 minutes after meals. (Patient not taking: No sig reported) 84 tablet 0   Cholecalciferol (VITAMIN D) 50 MCG (2000 UT) tablet Take 2,000 Units by mouth daily. (Patient not taking: No sig reported)     diphenhydrAMINE  HCl, Sleep, 50 MG CAPS Take 100 mg by mouth at bedtime. (Patient not taking: No sig reported)     ondansetron   (ZOFRAN ) 8 MG tablet Take 1 tablet (8 mg total) by mouth 2 (two) times daily as needed for refractory nausea / vomiting. Start on day 3 after chemotherapy. (Patient not taking: No sig reported) 30 tablet 1   potassium chloride SA (KLOR-CON M) 20 MEQ tablet Take 2 tablets (40 mEq total) by mouth daily for 3 days, THEN 1 tablet (20 mEq total) daily for 3 days. 30 tablet 1   prochlorperazine  (COMPAZINE ) 10 MG tablet Take 1 tablet (10 mg total) by mouth every 6 (six) hours as needed (Nausea or vomiting). (Patient not taking: No sig reported) 30 tablet 1   sildenafil (VIAGRA) 100 MG tablet Take 100 mg by mouth daily as needed for erectile dysfunction. (Patient not taking: No sig reported)     Current Facility-Administered Medications  Medication Dose Route Frequency Provider Last Rate Last Admin   0.9 %  sodium chloride  infusion  500 mL Intravenous Once Dove G[place], Inocente HERO, MD        Allergies as of 12/22/2023   (No Known Allergies)    Family History  Problem Relation Age of Onset   Cancer Father        lung   Cancer Paternal Uncle    Cancer Cousin        paternal, lung   Cancer Paternal Uncle    Cancer Paternal Uncle    Cancer Paternal Uncle    Cancer Paternal Uncle    Cancer Paternal Uncle     Social History   Socioeconomic History   Marital status: Significant Other    Spouse name: Not on file   Number of children: 3   Years of education: Not on file   Highest education level: Not on file  Occupational History   Not on file  Tobacco Use   Smoking status: Former    Current packs/day: 0.00    Average packs/day: 1 pack/day for 5.0 years (5.0 ttl pk-yrs)    Types: Cigarettes    Start date: 78    Quit date: 1993    Years since quitting: 32.8   Smokeless tobacco: Never  Vaping Use   Vaping status: Never Used  Substance and Sexual Activity   Alcohol use: Yes    Alcohol/week: 14.0 standard drinks of alcohol    Types: 14 Shots of liquor per week    Comment: gin daily x25  years, cut down to 2 drinks a day   Drug use: Never   Sexual activity: Yes  Other Topics Concern   Not on file  Social History Narrative   Lives alone, has significant other and sibling support. 3 adult children (1 son has Crohn's). Daughter's mother had breast cancer (pt's daughter reportedly negative genetic testing). 6 paternal uncles had either prostate or colon cancers, details kept private in the family. 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  Social Drivers of Corporate Investment Banker Strain: Not on file  Food Insecurity: No Food Insecurity (12/17/2023)   Hunger Vital Sign    Worried About Running Out of Food in the Last Year: Never true    Ran Out of Food in the Last Year: Never true  Transportation Needs: No Transportation Needs (12/17/2023)   PRAPARE - Administrator, Civil Service (Medical): No    Lack of Transportation (Non-Medical): No  Physical Activity: Not on file  Stress: Not on file  Social Connections: Unknown (06/21/2021)   Received from [facility]   Social Network    Social Network: Not on file  Intimate Partner Violence: Not At Risk (12/17/2023)   Humiliation, Afraid, Rape, and Kick questionnaire    Fear of Current or Ex-Partner: No    Emotionally Abused: No    Physically Abused: No    Sexually Abused: No    Review of Systems:  All other review of systems negative except as mentioned in the HPI.  Physical Exam: Vital signs BP 126/83   Pulse 67   Temp (!) 97.5 F (36.4 C) (Temporal)   Resp 14   Ht 6' (1.829 m)   Wt 197 lb (89.4 kg)   SpO2 98%   BMI 26.72 kg/m   General:   Alert,  Well-developed, well-nourished, pleasant and cooperative in NAD Airway:  Mallampati 2 Lungs:  Clear throughout to auscultation.   Heart:  Regular rate and rhythm; no murmurs, clicks, rubs,  or gallops. Abdomen:  Soft, nontender and nondistended. Normal bowel sounds.   Neuro/Psych:  Normal mood and affect. A and  O x 3  Inocente Hausen, MD [facility]

## 2023-12-22 ENCOUNTER — Encounter: Payer: Self-pay | Admitting: Pediatrics

## 2023-12-22 ENCOUNTER — Ambulatory Visit: Admitting: Pediatrics

## 2023-12-22 VITALS — BP 121/76 | HR 70 | Temp 97.5°F | Resp 12 | Ht 72.0 in | Wt 197.0 lb

## 2023-12-22 DIAGNOSIS — Z85038 Personal history of other malignant neoplasm of large intestine: Secondary | ICD-10-CM

## 2023-12-22 DIAGNOSIS — D124 Benign neoplasm of descending colon: Secondary | ICD-10-CM

## 2023-12-22 DIAGNOSIS — D123 Benign neoplasm of transverse colon: Secondary | ICD-10-CM | POA: Diagnosis not present

## 2023-12-22 DIAGNOSIS — K648 Other hemorrhoids: Secondary | ICD-10-CM | POA: Diagnosis not present

## 2023-12-22 DIAGNOSIS — Z9049 Acquired absence of other specified parts of digestive tract: Secondary | ICD-10-CM

## 2023-12-22 DIAGNOSIS — Z1211 Encounter for screening for malignant neoplasm of colon: Secondary | ICD-10-CM | POA: Diagnosis not present

## 2023-12-22 MED ORDER — SODIUM CHLORIDE 0.9 % IV SOLN: 500.0000 mL | Freq: Once | INTRAVENOUS | Status: DC

## 2023-12-22 NOTE — Patient Instructions (Signed)
 Resume previous diet Continue present medications Awaiting pathology results Repeat colonoscopy for surveillance based on pathology results Handouts provided on polyps and hemorrhoids.  YOU HAD AN ENDOSCOPIC PROCEDURE TODAY AT THE [facility]:   Refer to the procedure report that was given to you for any specific questions about what was found during the examination.  If the procedure report does not answer your questions, please call your gastroenterologist to clarify.  If you requested that your care partner not be given the details of your procedure findings, then the procedure report has been included in a sealed envelope for you to review at your convenience later.  YOU SHOULD EXPECT: Some feelings of bloating in the abdomen. Passage of more gas than usual.  Walking can help get rid of the air that was put into your GI tract during the procedure and reduce the bloating. If you had a lower endoscopy (such as a colonoscopy or flexible sigmoidoscopy) you may notice spotting of blood in your stool or on the toilet paper. If you underwent a bowel prep for your procedure, you may not have a normal bowel movement for a few days.  Please Note:  You might notice some irritation and congestion in your nose or some drainage.  This is from the oxygen used during your procedure.  There is no need for concern and it should clear up in a day or so.  SYMPTOMS TO REPORT IMMEDIATELY:  Following lower endoscopy (colonoscopy or flexible sigmoidoscopy):  Excessive amounts of blood in the stool  Significant tenderness or worsening of abdominal pains  Swelling of the abdomen that is new, acute  Fever of 100F or higher  For urgent or emergent issues, a gastroenterologist can be reached at any hour by calling [phone]. Do not use MyChart messaging for urgent concerns.    DIET:  We do recommend a small meal at first, but then you may proceed to your regular diet.  Drink plenty of fluids but  you should avoid alcoholic beverages for 24 hours.  ACTIVITY:  You should plan to take it easy for the rest of today and you should NOT DRIVE or use heavy machinery until tomorrow (because of the sedation medicines used during the test).    FOLLOW UP: Our staff will call the number listed on your records the next business day following your procedure.  We will call around 7:15- 8:00 am to check on you and address any questions or concerns that you may have regarding the information given to you following your procedure. If we do not reach you, we will leave a message.     If any biopsies were taken you will be contacted by phone or by letter within the next 1-3 weeks.  Please call us  at [phone] if you have not heard about the biopsies in 3 weeks.    SIGNATURES/CONFIDENTIALITY: You and/or your care partner have signed paperwork which will be entered into your electronic medical record.  These signatures attest to the fact that that the information above on your After Visit Summary has been reviewed and is understood.  Full responsibility of the confidentiality of this discharge information lies with you and/or your care-partner.

## 2023-12-22 NOTE — Op Note (Addendum)
 [facility] Patient Name: Andrew Hines Procedure Date: 12/22/2023 10:30 AM MRN: [id number] Endoscopist: Inocente Hausen , MD, [id number] Age: 63 Referring MD:  Date of Birth: October 29, 1960 Gender: Male Account #: [id number] Procedure:                Colonoscopy Indications:              High risk colon cancer surveillance: Personal                            history of colon cancer -ascending colon cancer                            status post right hemicolectomy 2022, recent                            imaging with mesenteric mass, elevated CEA level,                            last colonoscopy performed in 2022 at the time of                            diagnosis; incidental notation of diarrhea Medicines:                Monitored Anesthesia Care Procedure:                Pre-Anesthesia Assessment:                           - Prior to the procedure, a History and Physical                            was performed, and patient medications and                            allergies were reviewed. The patient's tolerance of                            previous anesthesia was also reviewed. The risks                            and benefits of the procedure and the sedation                            options and risks were discussed with the patient.                            All questions were answered, and informed consent                            was obtained. Prior Anticoagulants: The patient has                            taken no anticoagulant or antiplatelet agents. ASA  Grade Assessment: II - A patient with mild systemic                            disease. After reviewing the risks and benefits,                            the patient was deemed in satisfactory condition to                            undergo the procedure.                           After obtaining informed consent, the colonoscope                            was passed under direct  vision. Throughout the                            procedure, the patient's blood pressure, pulse, and                            oxygen saturations were monitored continuously. The                            CF [id number] #[id number] was introduced through the anus                            and advanced to the terminal ileum. The colonoscopy                            was performed without difficulty. The patient                            tolerated the procedure well. The quality of the                            bowel preparation was adequate. The rectum was                            photographed. Scope In: 10:41:10 AM Scope Out: 11:00:43 AM Scope Withdrawal Time: 0 hours 12 minutes 14 seconds  Total Procedure Duration: 0 hours 19 minutes 33 seconds  Findings:                 The perianal and digital rectal examinations were                            normal. Pertinent negatives include normal                            sphincter tone and no palpable rectal lesions.                           Two sessile polyps were found in the descending  colon and transverse colon. The polyps were 5 to 7                            mm in size. These polyps were removed with a cold                            snare. Resection and retrieval were complete.                           A 3 mm polyp was found in the transverse colon. The                            polyp was sessile. The polyp was removed with a                            cold biopsy forceps. Resection and retrieval were                            complete.                           The ileocolic anastomosis appeared patent and                            healthy without evidence of recurrent mass lesion.                           The neo-terminal ileum appeared normal.                           Internal hemorrhoids were found during retroflexion. Complications:            No immediate complications. Estimated blood loss:                             Minimal. Estimated Blood Loss:     Estimated blood loss was minimal. Impression:               - Two 5 to 7 mm polyps in the descending colon and                            in the transverse colon, removed with a cold snare.                            Resected and retrieved.                           - One 3 mm polyp in the transverse colon, removed                            with a cold biopsy forceps. Resected and retrieved.                           - Patent and healthy appearing ileocolic  anastomosis without evidence of recurrent mass                            lesion.                           - Normal neoterminal ileum.                           - Internal hemorrhoids. Recommendation:           - Discharge patient to home (ambulatory).                           - Await pathology results.                           - Repeat colonoscopy for surveillance based on                            pathology results.                           - Consider trial of Colestid for possible bile acid                            malabsorption contributing to diarrhea post                            hemicolectomy.                           - The findings and recommendations were discussed                            with the patient's family.                           - Return to GI clinic in 2-3 months with Dr.                            Suzann or APP Oscar Coombs or [facility])                           - Patient has a contact number available for                            emergencies. The signs and symptoms of potential                            delayed complications were discussed with the                            patient. Return to normal activities tomorrow.                            Written discharge instructions were provided to the  patient. Inocente Hausen, MD 12/22/2023 11:09:01 AM This report has been signed  electronically.

## 2023-12-22 NOTE — Progress Notes (Signed)
 Transferred to PACU via stretcher.  Not responding to stimulation at this time.  VSS upon leaving procedure room.

## 2023-12-22 NOTE — Progress Notes (Signed)
 Called to room to assist during endoscopic procedure.  Patient ID and intended procedure confirmed with present staff. Received instructions for my participation in the procedure from the performing physician.

## 2023-12-23 ENCOUNTER — Telehealth: Payer: Self-pay | Admitting: Pulmonary Disease

## 2023-12-23 ENCOUNTER — Telehealth: Payer: Self-pay

## 2023-12-23 NOTE — Telephone Encounter (Signed)
 Case reviewed a Pulmonary Nodule Conference. Please schedule patient for pulmonary nodules with whoever is available between Dr. Catherine at [facility], Dr. Shelah, Dr. Zaida, Lauraine Lites or myself 11/20 or 11/21.  Thanks, JD

## 2023-12-23 NOTE — Progress Notes (Signed)
 PATIENT NAVIGATOR PROGRESS NOTE  Name: Andrew Hines Date: 12/23/2023 MRN: [id number]  DOB: February 12, 1960   Reason for visit:  Phone call follow-up  Comments:    Per Dr. Demetra request, informed patient his case was discussed during multidisciplinary conference (nodule).  Per recommendations, patient will be contacted by pulmonary to be evaluated for a bronchoscopy  after his PET scan had been completed.  Patient verbalized understanding to the recommendations.  Patient stated he has a spam blocker on his phone and any phone calls from numbers not listed in his contacts will go straight to voicemail.  Patient stated he does check his voicemail regularly.  Patient was informed the office that will call him will be through [facility] and to monitor his voicemails.   Patient verbalized understanding and agreement.    Time spent counseling/coordinating care: 30-45 minutes

## 2023-12-23 NOTE — Telephone Encounter (Signed)
  Follow up Call-     12/22/2023    9:09 AM  Call back number  Post procedure Call Back phone  # [phone]  Permission to leave phone message Yes     Patient questions:  Do you have a fever, pain , or abdominal swelling? No. Pain Score  0 *  Have you tolerated food without any problems? Yes.    Have you been able to return to your normal activities? Yes.    Do you have any questions about your discharge instructions: Diet   No. Medications  No. Follow up visit  No.  Do you have questions or concerns about your Care? No.  Actions: * If pain score is 4 or above: No action needed, pain <4.

## 2023-12-23 NOTE — Telephone Encounter (Signed)
 Called patient and he states that he is back to regular activities, eating normal and has no pain, problems or concerns. Patient is aware and knows to call us  back with any questions or concerns.

## 2023-12-24 ENCOUNTER — Telehealth: Payer: Self-pay

## 2023-12-24 NOTE — Telephone Encounter (Addendum)
 Thoracic Tumor Board Review  Andrew Hines was reviewed before presentation on the on the Thoracic Tumor Board on 12/24/2023. He has a personal history of colon cancer.  Andrew Hines meets [employer] (NCCN) criteria for genetic testing bases on his personal and strong family history of cancer, which includes 6 paternal uncles with colon or prostate cancer (unsure as relatives did not share openly).  He was previously referred to genetics in 2022 at the time of his diagnosis and declined the visit. He was re-refereed in November 2025 and scheduled for a visit with me 02/01/2024.  Santana Fryer, MS, CGC  Certified Genetic Counselor  Email: [email or web address]  Phone: [phone]

## 2023-12-25 ENCOUNTER — Ambulatory Visit: Payer: Self-pay | Admitting: Pediatrics

## 2023-12-25 LAB — SURGICAL PATHOLOGY

## 2023-12-28 ENCOUNTER — Encounter (HOSPITAL_COMMUNITY)

## 2023-12-29 ENCOUNTER — Encounter (HOSPITAL_COMMUNITY)
Admission: RE | Admit: 2023-12-29 | Discharge: 2023-12-29 | Disposition: A | Discharge status: Home / Self Care | Source: Ambulatory Visit | Attending: Hematology | Admitting: Hematology

## 2023-12-29 DIAGNOSIS — K4090 Unilateral inguinal hernia, without obstruction or gangrene, not specified as recurrent: Secondary | ICD-10-CM | POA: Insufficient documentation

## 2023-12-29 DIAGNOSIS — R1901 Right upper quadrant abdominal swelling, mass and lump: Secondary | ICD-10-CM | POA: Diagnosis not present

## 2023-12-29 DIAGNOSIS — R918 Other nonspecific abnormal finding of lung field: Secondary | ICD-10-CM | POA: Insufficient documentation

## 2023-12-29 DIAGNOSIS — I7091 Generalized atherosclerosis: Secondary | ICD-10-CM | POA: Diagnosis not present

## 2023-12-29 DIAGNOSIS — R1900 Intra-abdominal and pelvic swelling, mass and lump, unspecified site: Secondary | ICD-10-CM | POA: Diagnosis present

## 2023-12-29 LAB — GLUCOSE, CAPILLARY: Glucose-Capillary: 108 mg/dL — ABNORMAL HIGH (ref 70–99)

## 2023-12-29 MED ORDER — FLUDEOXYGLUCOSE F - 18 (FDG) INJECTION
9.8600 | Freq: Once | INTRAVENOUS | Status: AC | PRN
  Administered 2023-12-29: 9.86 via INTRAVENOUS

## 2023-12-30 NOTE — Assessment & Plan Note (Signed)
 [id number] stage IIIB, MMR normal -diagnosed in 05/2020. Staging work-up showed renal and liver cysts, otherwise negative for distant metastatic disease. Pre-op CEA not done. -He underwent right hemicolectomy on 06/13/20 with Dr. 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  Margins were clear. There was no perineural invasion or perforation. MMR is normal -he had adjuvant chemo CAPOX for 3 months -PET on 11/18 showed large hypermetabolic mesenteric mass and multiple small hypermetabolic lung nodules.  Plan for bronchoscopy biopsy.

## 2023-12-31 ENCOUNTER — Inpatient Hospital Stay: Admitting: Hematology

## 2023-12-31 VITALS — BP 142/89 | HR 80 | Temp 97.9°F | Resp 15 | Ht 72.0 in | Wt 203.3 lb

## 2023-12-31 DIAGNOSIS — C182 Malignant neoplasm of ascending colon: Secondary | ICD-10-CM

## 2023-12-31 NOTE — Progress Notes (Signed)
 [facility]   [phone] [phone]   Clinic Follow up Note   Patient Care Team: Health, [facility] as PCP - Diedre Silversmith, [name] K, NP as Nurse Practitioner (Oncology) Lanny Callander, MD as Consulting Physician (Hematology and Oncology) Teresa Lonni HERO, MD as Consulting Physician (General Surgery) Tamela Maudlin, MD as Referring Physician (Gastroenterology) Ardis Evalene CROME, RN as Oncology Nurse Navigator  Date of Service:  12/31/2023  CHIEF COMPLAINT: f/u of colon cancer  CURRENT THERAPY:  Pending  Oncology History   Malignant neoplasm of ascending colon (HCC) pT3pN1bM0 stage IIIB, MMR normal -diagnosed in 05/2020. Staging work-up showed renal and liver cysts, otherwise negative for distant metastatic disease. Pre-op CEA not done. -He underwent right hemicolectomy on 06/13/20 with Dr. 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  Margins were clear. There was no perineural invasion or perforation. MMR is normal -he had adjuvant chemo CAPOX for 3 months -PET on 11/18 showed large hypermetabolic mesenteric mass and multiple small hypermetabolic lung nodules.  Plan for bronchoscopy biopsy.  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 Colonoscopy was negative for colon cancer. Differential includes recurrent colon cancer or new primary cancer. Tissue diagnosis is necessary to confirm cancer type and guide treatment. Surgery is not feasible due to the mass's location and multiple lung nodules. Chemotherapy is the primary treatment option if confirmed as recurrent colon cancer. FOLFOX regimen is preferred due to previous tolerance and effectiveness, with a lower dose to minimize neuropathy. - Scheduled bronchoscopy and lung  nodule biopsy for tissue diagnosis. - Requested port placement for chemotherapy administration. - Plan to start FOLFOX chemotherapy regimen after biopsy results confirm metastatic colon cancer.  Chemotherapy-induced peripheral neuropathy, history of Previous chemotherapy with oxaliplatin  caused peripheral neuropathy, including numbness, tingling, and cold sensitivity. FOLFOX regimen is preferred due to previous tolerance and effectiveness, with a lower dose to minimize neuropathy. - Will administer FOLFOX chemotherapy with a lower dose of oxaliplatin  to minimize neuropathy.  Plan - I reviewed his colonoscopy report and the biopsy results, which was negative for malignancy. - I reviewed his PET scan findings with the patient and his daughter, which is highly concerning for recurrent malignancy from colon cancer. - I reached out to [facility] pulmonary and they will schedule his bronchoscopy and a biopsy. - Will schedule IR port placement after bronchoscopy biopsy, and the plan to start chemo after that. - Follow-up after bronchoscopy biopsy.   SUMMARY OF ONCOLOGIC HISTORY: Oncology History Overview Note  Cancer Staging Malignant neoplasm of ascending colon [facility]) Staging form: Colon and Rectum, AJCC 8th Edition - Pathologic stage from 06/13/2020: Stage IIIB (pT3, pN1b, cM0) - Signed by Burton, [name] K, NP on 07/03/2020 Stage prefix: Initial diagnosis Histologic grading system: 4 grade system Histologic grade (G): G2 Lymph-vascular invasion (LVI): LVI present/identified, NOS Perineural invasion (PNI): Absent    Malignant neoplasm of ascending colon (HCC)  04/17/2020 Procedure   Colonoscopy at [facility], by Dr. Maudlin Shahid A mass was found in the proximal ascending colon, 1/3 circumference of the lumen, nonobstructing, ulcerated and friable multiple biopsies were performed.  A single polyp measuring 4 mm was found in the descending colon, a polypectomy was performed.  The  resection was complete.  Medium size internal hemorrhoids were found.   04/17/2020 Initial Biopsy  Path : Proximal ascending colon: Invasive moderately differentiated adenocarcinoma arising in a tubular adenoma with high-grade dysplasia  Descending colon polyp: tubular adenoma   04/17/2020 Imaging   Staging CT A/P 04/17/20 at [facility]: Impression The lung bases are clear.  Focal 6 mm low-density lesion in the right lobe of the liver which does not enhance and likely represents a small cyst.  No altered areas of attenuation are identified to suggest hepatic mass.  The spleen, pancreas, adrenal glands have a normal appearance.  There are cortical cysts in both kidneys.  Both kidneys function normally.  Large amount of gas in the colon and this post colonoscopy patient.  No obvious colonic masses.  No pneumoperitoneum.  No free fluid or abdominal fluid collections.  No lytic or blastic lesions.  Right inguinal hernia filled with fat.  Impression: Renal cyst with probable hepatic cyst without definite evidence of metastatic disease.  Right inguinal hernia.   06/04/2020 Imaging   Staging CT chest IMPRESSION: 1. No evidence of metastatic disease. 2. Hepatic steatosis. 3. Aortic atherosclerosis (ICD10-I70.0). Coronary artery calcification. 4. Enlarged pulmonary arteries, indicative of pulmonary arterial hypertension.     06/13/2020 Cancer Staging   Staging form: Colon and Rectum, AJCC 8th Edition - Pathologic stage from 06/13/2020: Stage IIIB (pT3, pN1b, cM0) - Signed by Burton, [name] K, NP on 07/03/2020 Stage prefix: Initial diagnosis Histologic grading system: 4 grade system Histologic grade (G): G2 Lymph-vascular invasion (LVI): LVI present/identified, NOS Perineural invasion (PNI): Absent   06/13/2020 Surgery   PROCEDURE: Laparoscopic-assisted right hemicolectomy 2. Lysis of adhesions x 90 minutes 3. Partial omentectomy 4. Bilateral transversus abdominus plane blocks SURGEON: Lonni HERO. White, MD   06/13/2020 Pathology Results   FINAL MICROSCOPIC DIAGNOSIS:  A. COLON, RIGHT AND TERMINAL ILEUM, RESECTION:  - Invasive adenocarcinoma, moderately differentiated, spanning 3.8 cm.  - Tumor invades through muscularis propria into pericolonic tissue.  - Lymphovascular invasion present.  - Resection margins are negative.  - Metastatic carcinoma in two of nineteen lymph nodes (2/19).  - See oncology table.   B. OMENTUM, RESECTION:  - Benign fibroadipose tissue.   pT3pN1b   IHC EXPRESSION RESULTS  TEST           RESULT  MLH1:          Preserved nuclear expression  MSH2:          Preserved nuclear expression  MSH6:          Preserved nuclear expression  PMS2:          Preserved nuclear expression    06/13/2020 Initial Diagnosis   Malignant neoplasm of ascending colon (HCC)   07/23/2020 -  Chemotherapy   CAPOX q3weeks with Xeloda  1500mg  in the AM and 2000mg  in the PM starting 07/23/20.        Discussed the use of AI scribe software for clinical note transcription with the patient, who gave verbal consent to proceed.  History of Present Illness Andrew Hines is a 63 year old male with recurrent colon cancer who presents for follow-up.  Two weeks ago, he underwent a PET scan and a colonoscopy. The colonoscopy revealed polyps without evidence of cancer, confirmed by biopsy. The PET scan identified a mass in the abdomen and hypermetabolic nodules in the lung. The abdominal mass is located among several blood vessels, making a needle biopsy unsafe.  He is asymptomatic, with no pain or discomfort, and has resumed normal activities, including regular eating habits.  He previously received chemotherapy with Kapox,  which resulted in neuropathy characterized by numbness, tingling, and sensitivity to cold. He prefers a different regimen due to these side effects.     All other systems were reviewed with the patient and are negative.  MEDICAL HISTORY:  Past Medical History:   Diagnosis Date   Colon cancer (HCC) 04/17/2020   HBP (high blood pressure) 2025   Pneumonia    2020    Skin cancer, basal cell     SURGICAL HISTORY: Past Surgical History:  Procedure Laterality Date   APPENDECTOMY     LAPAROSCOPIC RIGHT HEMI COLECTOMY N/A 06/13/2020   Procedure: LAPAROSCOPIC RIGHT HEMI COLECTOMY AND PARTIAL OMENTECTOMY WITH LYSIS OF ADHESIONS;  Surgeon: Teresa Lonni HERO, MD;  Location: [facility] ORS;  Service: General;  Laterality: N/A;    I have reviewed the social history and family history with the patient and they are unchanged from previous note.  ALLERGIES:  has no known allergies.  MEDICATIONS:  Current Outpatient Medications  Medication Sig Dispense Refill   amLODipine (NORVASC) 5 MG tablet Take 5 mg by mouth daily.     capecitabine  (XELODA ) 500 MG tablet Take 3 tabs in morning and 4 tabs in evening for 14 days then off 7 days. Repeat every 21 days. Take within 30 minutes after meals. (Patient not taking: No sig reported) 84 tablet 0   Cholecalciferol (VITAMIN D) 50 MCG (2000 UT) tablet Take 2,000 Units by mouth daily. (Patient not taking: No sig reported)     diphenhydrAMINE  HCl, Sleep, 50 MG CAPS Take 100 mg by mouth at bedtime. (Patient not taking: No sig reported)     hydrochlorothiazide (HYDRODIURIL) 25 MG tablet Take 25 mg by mouth daily.     ondansetron  (ZOFRAN ) 8 MG tablet Take 1 tablet (8 mg total) by mouth 2 (two) times daily as needed for refractory nausea / vomiting. Start on day 3 after chemotherapy. (Patient not taking: No sig reported) 30 tablet 1   potassium chloride  SA (KLOR-CON  M) 20 MEQ tablet Take 2 tablets (40 mEq total) by mouth daily for 3 days, THEN 1 tablet (20 mEq total) daily for 3 days. 30 tablet 1   prochlorperazine  (COMPAZINE ) 10 MG tablet Take 1 tablet (10 mg total) by mouth every 6 (six) hours as needed (Nausea or vomiting). (Patient not taking: No sig reported) 30 tablet 1   sildenafil (VIAGRA) 100 MG tablet Take 100 mg by mouth  daily as needed for erectile dysfunction. (Patient not taking: No sig reported)     Tetrahydrozoline HCl (VISINE OP) Place 1 drop into both eyes daily.     No current facility-administered medications for this visit.    PHYSICAL EXAMINATION: ECOG PERFORMANCE STATUS: 0 - Asymptomatic  Vitals:   12/31/23 0920 12/31/23 0923  BP: (!) 156/85 (!) 142/89  Pulse: 80   Resp: 15   Temp: 97.9 F (36.6 C)   SpO2: 100%    Wt Readings from Last 3 Encounters:  12/31/23 203 lb 4.8 oz (92.2 kg)  12/22/23 197 lb (89.4 kg)  12/17/23 197 lb 9.6 oz (89.6 kg)     GENERAL:alert, no distress and comfortable SKIN: skin color, texture, turgor are normal, no rashes or significant lesions EYES: normal, Conjunctiva are pink and non-injected, sclera clear NECK: supple, thyroid  normal size, non-tender, without nodularity LYMPH:  no palpable lymphadenopathy in the cervical, axillary  LUNGS: clear to auscultation and percussion with normal breathing effort HEART: regular rate & rhythm and no murmurs and no lower extremity edema ABDOMEN:abdomen soft, non-tender and  normal bowel sounds Musculoskeletal:no cyanosis of digits and no clubbing  NEURO: alert & oriented x 3 with fluent speech, no focal motor/sensory deficits  Physical Exam    LABORATORY DATA:  I have reviewed the data as listed    Latest Ref Rng & Units 11/27/2023    2:51 PM 09/24/2020    9:03 AM 09/03/2020    8:59 AM  CBC  WBC 4.0 - 10.5 K/uL 6.9  3.8  3.6   Hemoglobin 13.0 - 17.0 g/dL 84.3  86.1  85.9   Hematocrit 39.0 - 52.0 % 45.4  40.2  41.2   Platelets 150.0 - 400.0 K/uL 373.0  198  242         Latest Ref Rng & Units 12/09/2023    1:17 PM 11/27/2023    2:51 PM 09/24/2020    9:03 AM  CMP  Glucose 70 - 99 mg/dL 896  99  898   BUN 6 - 23 mg/dL 15  19  12    Creatinine 0.40 - 1.50 mg/dL 8.54  8.61  8.90   Sodium 135 - 145 mEq/L 136  139  141   Potassium 3.5 - 5.1 mEq/L 3.2  2.9  4.0   Chloride 96 - 112 mEq/L 96  94  109   CO2  19 - 32 mEq/L 30  34  21   Calcium 8.4 - 10.5 mg/dL 9.3  9.8  9.0   Total Protein 6.0 - 8.3 g/dL 8.2  8.9  6.9   Total Bilirubin 0.2 - 1.2 mg/dL 0.8  0.4  0.5   Alkaline Phos 39 - 117 U/L 99  111  100   AST 0 - 37 U/L 49  41  29   ALT 0 - 53 U/L 20  21  18        RADIOGRAPHIC STUDIES: I have personally reviewed the radiological images as listed and agreed with the findings in the report. NM PET Image Initial (PI) Skull Base To Thigh (F-18 FDG) Result Date: 12/30/2023 EXAM: PET AND CT SKULL BASE TO MID THIGH 12/29/2023 05:53:26 PM TECHNIQUE: RADIOPHARMACEUTICAL: 9.86 mCi F-18 FDG Uptake time 60 minutes. Glucose level 108 mg/dl. Blood pool SUV 2.5. PET imaging was acquired from the base of the skull to the mid thighs. Non-contrast enhanced computed tomography was obtained for attenuation correction and anatomic localization. COMPARISON: None available. CLINICAL HISTORY: Upper abdominal mesenteric mass. FINDINGS: HEAD AND NECK: No metabolically active cervical lymphadenopathy. Left common carotid atheromatous vascular calcification. CHEST: 9 x 7 mm left upper lobe pulmonary nodule on image 20 of series 7 with maximum SUV of 3.7. 8 x 5 mm right middle lobe nodule on image 39 series 7 with maximum SUV 3.3. Peribronchovascular nodule in the left lower lobe on image 41 series 7, maximum SUV 3.7. Other nodules are faintly metabolic but are below sensitive PET CT size thresholds. No metabolically active lymphadenopathy. Aortic and left anterior descending coronary artery atherosclerosis. ABDOMEN AND PELVIS: Right eccentric central mesenteric mass 5.9 x 4.0 cm on image 125 series 4, maximum SUV 9.4, compatible with malignancy. No hypermetabolic liver lesion. Diffuse hepatic steatosis. 1 cm right hepatic lobe cyst on image 98 series 4. Right hemicolectomy. Physiologic activity within the gastrointestinal and genitourinary systems. Systemic atherosclerosis is present, including the aorta and iliac arteries.  Indirect right inguinal hernia contains adipose tissue extending down towards the scrotum. BONES AND SOFT TISSUE: No abnormal FDG activity localizes to the bones. No metabolically active aggressive osseous lesion. IMPRESSION: 1. Right eccentric  central mesenteric mass, 5.9 x 4.0 cm, maximum SUV 9.4, compatible with malignancy. 2. Multiple hypermetabolic pulmonary nodules, largest 9 x 7 mm in the left upper lobe with maximum SUV 3.7, suspicious for metastatic disease. 3. Atherosclerosis. 4. Indirect right inguinal hernia contains adipose tissue. Electronically signed by: Ryan Salvage MD 12/30/2023 09:39 AM EST RP Workstation: [id number]      Orders Placed This Encounter  Procedures   IR IMAGING GUIDED PORT INSERTION    The week of 12/1    Standing Status:   Future    Expected Date:   01/07/2024    Expiration Date:   12/30/2024    Reason for Exam (SYMPTOM  OR DIAGNOSIS REQUIRED):   chemo    Preferred Imaging Location?:   [facility]   Ambulatory referral to Pulmonology    Referral Priority:   Urgent    Referral Type:   Consultation    Referral Reason:   Specialty Services Required    Requested Specialty:   Pulmonary Disease    Number of Visits Requested:   1   All questions were answered. The patient knows to call the clinic with any problems, questions or concerns. No barriers to learning was detected. The total time spent in the appointment was 40 minutes, including review of chart and various tests results, discussions about plan of care and coordination of care plan     Onita Mattock, MD 12/31/2023

## 2024-01-05 ENCOUNTER — Encounter: Payer: Self-pay | Admitting: Pulmonary Disease

## 2024-01-05 ENCOUNTER — Telehealth: Payer: Self-pay | Admitting: Pulmonary Disease

## 2024-01-05 ENCOUNTER — Telehealth: Payer: Self-pay

## 2024-01-05 ENCOUNTER — Telehealth: Payer: Self-pay | Admitting: Hematology

## 2024-01-05 ENCOUNTER — Ambulatory Visit: Admitting: Pulmonary Disease

## 2024-01-05 VITALS — BP 127/86 | HR 83 | Ht 72.0 in | Wt 202.0 lb

## 2024-01-05 DIAGNOSIS — C182 Malignant neoplasm of ascending colon: Secondary | ICD-10-CM

## 2024-01-05 DIAGNOSIS — R918 Other nonspecific abnormal finding of lung field: Secondary | ICD-10-CM

## 2024-01-05 NOTE — Telephone Encounter (Signed)
 Pt is scheduled and aware of appt and times

## 2024-01-05 NOTE — Telephone Encounter (Signed)
 Patient letter has been given to [facility]. Routing to [employer] for [employer]

## 2024-01-05 NOTE — Telephone Encounter (Signed)
 Please schedule the following:  Provider performing procedure: any provider: Shelah Herter, Alghanim, Keagon Glascoe Diagnosis: Lung Nodules, Colon Mass Which side if for nodule / mass? bilateral Procedure: Robotic Assisted Navigational Bronchoscopy Has patient been spoken to by Provider and given informed consent? Yes Anesthesia: General Do you need Fluro? Yes Duration of procedure: 1hr to 1.5 hour Date: 12/9 Alternate Date: n/a  Time: anytime Location: [facility] Does patient have OSA? No DM? No Or Latex allergy? No Medication Restriction/ Anticoagulate/Antiplatelet: n/a Pre-op Labs Ordered:determined by Anesthesia Imaging request: n/a  (If, SuperDimension CT Chest, please have STAT courier sent to ENDO)   Thanks, Dorn Chill, MD  Pulmonary & Critical Care Office: [phone]

## 2024-01-05 NOTE — Telephone Encounter (Signed)
 Copied from CRM [id number]. Topic: General - Other >> Jan 05, 2024 10:08 AM Benton KIDD wrote: Reason for CRM: patient daughter us  calling because of the wrong information she was giving about a appointment that was for today to do a biopsy . Patient daughter say she spoke with a nurse and the nurse said ne needed someone with him today for the biopsy just kind of confused about the information that was received  Please give patient daughter lynsey a call back concerning the biopsy appointment that was suppose to be today but it wasn't it was just like a consultation  [phone]   Patient is scheduled for Bronch 12/9 Called and spoke with the pts daughter (DPR) and explained that today was a consult office visit and then Dr. Kara and the pt decided to continue with the biopsy. I advised of bronch date & instructions.  Daughter verbalized understanding. Nothing further needed.

## 2024-01-05 NOTE — Assessment & Plan Note (Addendum)
 SABRA

## 2024-01-05 NOTE — Progress Notes (Signed)
 PATIENT NAVIGATOR PROGRESS NOTE  Name: Andrew Hines Date: 01/05/2024 MRN: [id number]  DOB: [birth year]   Reason for visit:   Comments:   Patient has been scheduled for bronchoscopy on 12/9.  Called and informed patient the plan for next steps, to include port placement and follow-up with Dr. Lanny after the bronchoscopy has been completed.  Patient verbalized understanding and agreement with plan.  Orders were entered for port a cath placement.  Scheduling messages sent for port placement and for follow-up appointment.    Time spent counseling/coordinating care: 30-45 minutes

## 2024-01-05 NOTE — Assessment & Plan Note (Addendum)
  Orders:   CT SUPER D CHEST WO CONTRAST; Future

## 2024-01-05 NOTE — Patient Instructions (Addendum)
 We will work on scheduling you for robotic assisted navigation bronchoscopy on 12/9. Our scheduling team will be providing you further instructions.  We will schedule you for follow up visit the week of 12/15 with myself or Lauraine Lites, NP to review results

## 2024-01-05 NOTE — H&P (View-Only) (Signed)
 New Patient Pulmonology Office Visit   Subjective:  Patient ID: Andrew Hines, male    DOB: September 09, 1960  MRN: [id number]  Referred by: Health, Izell Rubens  CC:  Chief Complaint  Patient presents with   Consult    Pt states biopsy talk     Discussed the use of AI scribe software for clinical note transcription with the patient, who gave verbal consent to proceed.  History of Present Illness Andrew Hines is a 63 year old male with stage three B colon cancer who presents with lung nodules and a new large abdominal mass. He was referred by Dr. Lanny for evaluation of lung nodules and a new large abdominal mass.  He was diagnosed with stage IIIB colon cancer in April 2022 and underwent a right hemicolectomy on Jun 13, 2020, followed by three months of adjuvant chemotherapy. A PET scan on December 29, 2023 showed lung nodules and a new large abdominal mass.  He has no fevers, chills, chest pain, dyspnea, or other respiratory symptoms.  He smoked about a pack of cigarettes a week until quitting both in Feb 13, 1992 after his father died of lung cancer. He is retired, worked in [employer].        Review of Systems  Constitutional:  Negative for chills, fever, malaise/fatigue and weight loss.  HENT:  Negative for congestion, sinus pain and sore throat.   Eyes: Negative.   Respiratory:  Negative for cough, hemoptysis, sputum production, shortness of breath and wheezing.   Cardiovascular:  Negative for chest pain, palpitations, orthopnea, claudication and leg swelling.  Gastrointestinal:  Negative for abdominal pain, heartburn, nausea and vomiting.  Genitourinary: Negative.   Musculoskeletal:  Negative for joint pain and myalgias.  Skin:  Negative for rash.  Neurological:  Negative for weakness.  Endo/Heme/Allergies: Negative.   Psychiatric/Behavioral: Negative.      Allergies: Patient has no known allergies.  Current Outpatient Medications:    amLODipine (NORVASC) 5 MG  tablet, Take 5 mg by mouth daily., Disp: , Rfl:    capecitabine  (XELODA ) 500 MG tablet, Take 3 tabs in morning and 4 tabs in evening for 14 days then off 7 days. Repeat every 21 days. Take within 30 minutes after meals., Disp: 84 tablet, Rfl: 0   Cholecalciferol (VITAMIN D) 50 MCG (2000 UT) tablet, Take 2,000 Units by mouth daily., Disp: , Rfl:    diphenhydrAMINE  HCl, Sleep, 50 MG CAPS, Take 100 mg by mouth at bedtime., Disp: , Rfl:    hydrochlorothiazide (HYDRODIURIL) 25 MG tablet, Take 25 mg by mouth daily., Disp: , Rfl:    ondansetron  (ZOFRAN ) 8 MG tablet, Take 1 tablet (8 mg total) by mouth 2 (two) times daily as needed for refractory nausea / vomiting. Start on day 3 after chemotherapy., Disp: 30 tablet, Rfl: 1   potassium chloride  SA (KLOR-CON  M) 20 MEQ tablet, Take 2 tablets (40 mEq total) by mouth daily for 3 days, THEN 1 tablet (20 mEq total) daily for 3 days., Disp: 30 tablet, Rfl: 1   prochlorperazine  (COMPAZINE ) 10 MG tablet, Take 1 tablet (10 mg total) by mouth every 6 (six) hours as needed (Nausea or vomiting)., Disp: 30 tablet, Rfl: 1   sildenafil (VIAGRA) 100 MG tablet, Take 100 mg by mouth daily as needed for erectile dysfunction., Disp: , Rfl:    Tetrahydrozoline HCl (VISINE OP), Place 1 drop into both eyes daily., Disp: , Rfl:  Past Medical History:  Diagnosis Date   Colon cancer (HCC) 04/17/2020  HBP (high blood pressure) 2025   Pneumonia    2020    Skin cancer, basal cell    Past Surgical History:  Procedure Laterality Date   APPENDECTOMY     LAPAROSCOPIC RIGHT HEMI COLECTOMY N/A 06/13/2020   Procedure: LAPAROSCOPIC RIGHT HEMI COLECTOMY AND PARTIAL OMENTECTOMY WITH LYSIS OF ADHESIONS;  Surgeon: Teresa Lonni HERO, MD;  Location: [facility];  Service: General;  Laterality: N/A;   Family History  Problem Relation Age of Onset   Colon polyps Mother    Hypertension Mother    Lung cancer Father        d. lung cancer   Prostate cancer Father        metastatic prostate    Alcohol abuse Father    Hypertension Sister    Hypertension Brother    Cancer Paternal Uncle        Prostate or colon cancer   Cancer Paternal Uncle        Prostate or colon cancer   Cancer Paternal Uncle        Prostate or colon cancer   Cancer Paternal Uncle        Prostate or colon cancer   Cancer Paternal Uncle        Prostate or colon cancer   Cancer Paternal Uncle        Prostate or colon cancer   Cancer Cousin        paternal, lung   Social History   Socioeconomic History   Marital status: Significant Other    Spouse name: Not on file   Number of children: 3   Years of education: Not on file   Highest education level: Not on file  Occupational History   Not on file  Tobacco Use   Smoking status: Former    Current packs/day: 0.00    Average packs/day: 1 pack/day for 5.0 years (5.0 ttl pk-yrs)    Types: Cigarettes    Start date: 5    Quit date: 1993    Years since quitting: 32.9   Smokeless tobacco: Never  Vaping Use   Vaping status: Never Used  Substance and Sexual Activity   Alcohol use: Yes    Alcohol/week: 14.0 standard drinks of alcohol    Types: 14 Shots of liquor per week    Comment: gin daily x25 years, cut down to 2 drinks a day   Drug use: Never   Sexual activity: Yes  Other Topics Concern   Not on file  Social History Narrative   Lives alone, has significant other and sibling support. 3 adult children (1 son has Crohn's). Daughter's mother had breast cancer (pt's daughter reportedly negative genetic testing). 6 paternal uncles had either prostate or colon cancers, details kept private in the family. 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     Social Drivers of Corporate Investment Banker Strain: Not on file  Food Insecurity: No Food Insecurity (12/17/2023)   Hunger Vital Sign    Worried About Running Out of Food in the Last Year: Never true    Ran Out of Food in the Last Year: Never true  Transportation  Needs: No Transportation Needs (12/17/2023)   PRAPARE - Administrator, Civil Service (Medical): No    Lack of Transportation (Non-Medical): No  Physical Activity: Not on file  Stress: Not on file  Social Connections: Unknown (06/21/2021)   Received from [facility]   Social Network  Social Network: Not on file  Intimate Partner Violence: Not At Risk (12/17/2023)   Humiliation, Afraid, Rape, and Kick questionnaire    Fear of Current or Ex-Partner: No    Emotionally Abused: No    Physically Abused: No    Sexually Abused: No       Objective:  BP 127/86   Pulse 83   Ht 6' (1.829 m) Comment: per pt  Wt 202 lb (91.6 kg)   SpO2 98%   BMI 27.40 kg/m    Physical Exam Constitutional:      General: He is not in acute distress.    Appearance: Normal appearance.  Eyes:     General: No scleral icterus.    Conjunctiva/sclera: Conjunctivae normal.  Cardiovascular:     Rate and Rhythm: Normal rate and regular rhythm.  Pulmonary:     Breath sounds: No wheezing, rhonchi or rales.  Musculoskeletal:     Right lower leg: No edema.     Left lower leg: No edema.  Skin:    General: Skin is warm and dry.  Neurological:     General: No focal deficit present.     Diagnostic Review:    NM PET Scan 12/29/23 1. Right eccentric central mesenteric mass, 5.9 x 4.0 cm, maximum SUV 9.4, compatible with malignancy. 2. Multiple hypermetabolic pulmonary nodules, largest 9 x 7 mm in the left upper lobe with maximum SUV 3.7, suspicious for metastatic disease. 3. Atherosclerosis. 4. Indirect right inguinal hernia contains adipose tissue.  CT Chest/Abd/Pelvis 12/04/23 1. Heterogeneously enhancing mass versus lymph node in the central mesentery measures 5.6 x 4.2 cm, compatible with metastatic disease. 2. Adjacent prominent mesenteric lymph nodes suspicious for additional nodal disease involvement. 3. Bilateral pulmonary nodules measuring up to 11 mm, suspicious for metastatic  disease. 4. Diffuse hepatic steatosis with a hyperdense wedge-shaped area near the gallbladder fossa, favored to reflect focal fatty sparing. Consider more definitive characterization by abdominal MRI with and without contrast. 5. Aortic atherosclerosis.    Assessment & Plan:   Assessment & Plan Lung nodules  Orders:   CT SUPER D CHEST WO CONTRAST; Future  Malignant neoplasm of ascending colon (HCC)      Assessment and Plan Assessment & Plan Pulmonary nodules suspicious for metastatic colon cancer - Scheduled robotic-assisted navigation bronchoscopy for lung biopsy on December 9th, 2025. Verbal consent provided. Risks of procedure reviewed. - Will determine if prior imaging is acceptable for procedure, if not will order new CT Chest Scan - Follow up with pathology results within 3-5 days post-biopsy with Lauraine Lites, NP or myself  History of stage IIIB colon cancer, status post right hemicolectomy and adjuvant chemotherapy - new abdominal mass on recent PET imaging - followed by oncology - Pending biopsy procedure for further treatment planning      Return in about 20 days (around 01/25/2024) for f/u visit Dr. Kara.   Dorn KATHEE Kara, MD

## 2024-01-05 NOTE — Progress Notes (Signed)
 New Patient Pulmonology Office Visit   Subjective:  Patient ID: Andrew Hines, male    DOB: September 09, 1960  MRN: [id number]  Referred by: Health, Izell Rubens  CC:  Chief Complaint  Patient presents with   Consult    Pt states biopsy talk     Discussed the use of AI scribe software for clinical note transcription with the patient, who gave verbal consent to proceed.  History of Present Illness Andrew Hines is a 63 year old male with stage three B colon cancer who presents with lung nodules and a new large abdominal mass. He was referred by Dr. Lanny for evaluation of lung nodules and a new large abdominal mass.  He was diagnosed with stage IIIB colon cancer in April 2022 and underwent a right hemicolectomy on Jun 13, 2020, followed by three months of adjuvant chemotherapy. A PET scan on December 29, 2023 showed lung nodules and a new large abdominal mass.  He has no fevers, chills, chest pain, dyspnea, or other respiratory symptoms.  He smoked about a pack of cigarettes a week until quitting both in Feb 13, 1992 after his father died of lung cancer. He is retired, worked in [employer].        Review of Systems  Constitutional:  Negative for chills, fever, malaise/fatigue and weight loss.  HENT:  Negative for congestion, sinus pain and sore throat.   Eyes: Negative.   Respiratory:  Negative for cough, hemoptysis, sputum production, shortness of breath and wheezing.   Cardiovascular:  Negative for chest pain, palpitations, orthopnea, claudication and leg swelling.  Gastrointestinal:  Negative for abdominal pain, heartburn, nausea and vomiting.  Genitourinary: Negative.   Musculoskeletal:  Negative for joint pain and myalgias.  Skin:  Negative for rash.  Neurological:  Negative for weakness.  Endo/Heme/Allergies: Negative.   Psychiatric/Behavioral: Negative.      Allergies: Patient has no known allergies.  Current Outpatient Medications:    amLODipine (NORVASC) 5 MG  tablet, Take 5 mg by mouth daily., Disp: , Rfl:    capecitabine  (XELODA ) 500 MG tablet, Take 3 tabs in morning and 4 tabs in evening for 14 days then off 7 days. Repeat every 21 days. Take within 30 minutes after meals., Disp: 84 tablet, Rfl: 0   Cholecalciferol (VITAMIN D) 50 MCG (2000 UT) tablet, Take 2,000 Units by mouth daily., Disp: , Rfl:    diphenhydrAMINE  HCl, Sleep, 50 MG CAPS, Take 100 mg by mouth at bedtime., Disp: , Rfl:    hydrochlorothiazide (HYDRODIURIL) 25 MG tablet, Take 25 mg by mouth daily., Disp: , Rfl:    ondansetron  (ZOFRAN ) 8 MG tablet, Take 1 tablet (8 mg total) by mouth 2 (two) times daily as needed for refractory nausea / vomiting. Start on day 3 after chemotherapy., Disp: 30 tablet, Rfl: 1   potassium chloride  SA (KLOR-CON  M) 20 MEQ tablet, Take 2 tablets (40 mEq total) by mouth daily for 3 days, THEN 1 tablet (20 mEq total) daily for 3 days., Disp: 30 tablet, Rfl: 1   prochlorperazine  (COMPAZINE ) 10 MG tablet, Take 1 tablet (10 mg total) by mouth every 6 (six) hours as needed (Nausea or vomiting)., Disp: 30 tablet, Rfl: 1   sildenafil (VIAGRA) 100 MG tablet, Take 100 mg by mouth daily as needed for erectile dysfunction., Disp: , Rfl:    Tetrahydrozoline HCl (VISINE OP), Place 1 drop into both eyes daily., Disp: , Rfl:  Past Medical History:  Diagnosis Date   Colon cancer (HCC) 04/17/2020  HBP (high blood pressure) 2025   Pneumonia    2020    Skin cancer, basal cell    Past Surgical History:  Procedure Laterality Date   APPENDECTOMY     LAPAROSCOPIC RIGHT HEMI COLECTOMY N/A 06/13/2020   Procedure: LAPAROSCOPIC RIGHT HEMI COLECTOMY AND PARTIAL OMENTECTOMY WITH LYSIS OF ADHESIONS;  Surgeon: Teresa Lonni HERO, MD;  Location: [facility];  Service: General;  Laterality: N/A;   Family History  Problem Relation Age of Onset   Colon polyps Mother    Hypertension Mother    Lung cancer Father        d. lung cancer   Prostate cancer Father        metastatic prostate    Alcohol abuse Father    Hypertension Sister    Hypertension Brother    Cancer Paternal Uncle        Prostate or colon cancer   Cancer Paternal Uncle        Prostate or colon cancer   Cancer Paternal Uncle        Prostate or colon cancer   Cancer Paternal Uncle        Prostate or colon cancer   Cancer Paternal Uncle        Prostate or colon cancer   Cancer Paternal Uncle        Prostate or colon cancer   Cancer Cousin        paternal, lung   Social History   Socioeconomic History   Marital status: Significant Other    Spouse name: Not on file   Number of children: 3   Years of education: Not on file   Highest education level: Not on file  Occupational History   Not on file  Tobacco Use   Smoking status: Former    Current packs/day: 0.00    Average packs/day: 1 pack/day for 5.0 years (5.0 ttl pk-yrs)    Types: Cigarettes    Start date: 5    Quit date: 1993    Years since quitting: 32.9   Smokeless tobacco: Never  Vaping Use   Vaping status: Never Used  Substance and Sexual Activity   Alcohol use: Yes    Alcohol/week: 14.0 standard drinks of alcohol    Types: 14 Shots of liquor per week    Comment: gin daily x25 years, cut down to 2 drinks a day   Drug use: Never   Sexual activity: Yes  Other Topics Concern   Not on file  Social History Narrative   Lives alone, has significant other and sibling support. 3 adult children (1 son has Crohn's). Daughter's mother had breast cancer (pt's daughter reportedly negative genetic testing). 6 paternal uncles had either prostate or colon cancers, details kept private in the family. 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     Social Drivers of Corporate Investment Banker Strain: Not on file  Food Insecurity: No Food Insecurity (12/17/2023)   Hunger Vital Sign    Worried About Running Out of Food in the Last Year: Never true    Ran Out of Food in the Last Year: Never true  Transportation  Needs: No Transportation Needs (12/17/2023)   PRAPARE - Administrator, Civil Service (Medical): No    Lack of Transportation (Non-Medical): No  Physical Activity: Not on file  Stress: Not on file  Social Connections: Unknown (06/21/2021)   Received from [facility]   Social Network  Social Network: Not on file  Intimate Partner Violence: Not At Risk (12/17/2023)   Humiliation, Afraid, Rape, and Kick questionnaire    Fear of Current or Ex-Partner: No    Emotionally Abused: No    Physically Abused: No    Sexually Abused: No       Objective:  BP 127/86   Pulse 83   Ht 6' (1.829 m) Comment: per pt  Wt 202 lb (91.6 kg)   SpO2 98%   BMI 27.40 kg/m    Physical Exam Constitutional:      General: He is not in acute distress.    Appearance: Normal appearance.  Eyes:     General: No scleral icterus.    Conjunctiva/sclera: Conjunctivae normal.  Cardiovascular:     Rate and Rhythm: Normal rate and regular rhythm.  Pulmonary:     Breath sounds: No wheezing, rhonchi or rales.  Musculoskeletal:     Right lower leg: No edema.     Left lower leg: No edema.  Skin:    General: Skin is warm and dry.  Neurological:     General: No focal deficit present.     Diagnostic Review:    NM PET Scan 12/29/23 1. Right eccentric central mesenteric mass, 5.9 x 4.0 cm, maximum SUV 9.4, compatible with malignancy. 2. Multiple hypermetabolic pulmonary nodules, largest 9 x 7 mm in the left upper lobe with maximum SUV 3.7, suspicious for metastatic disease. 3. Atherosclerosis. 4. Indirect right inguinal hernia contains adipose tissue.  CT Chest/Abd/Pelvis 12/04/23 1. Heterogeneously enhancing mass versus lymph node in the central mesentery measures 5.6 x 4.2 cm, compatible with metastatic disease. 2. Adjacent prominent mesenteric lymph nodes suspicious for additional nodal disease involvement. 3. Bilateral pulmonary nodules measuring up to 11 mm, suspicious for metastatic  disease. 4. Diffuse hepatic steatosis with a hyperdense wedge-shaped area near the gallbladder fossa, favored to reflect focal fatty sparing. Consider more definitive characterization by abdominal MRI with and without contrast. 5. Aortic atherosclerosis.    Assessment & Plan:   Assessment & Plan Lung nodules  Orders:   CT SUPER D CHEST WO CONTRAST; Future  Malignant neoplasm of ascending colon (HCC)      Assessment and Plan Assessment & Plan Pulmonary nodules suspicious for metastatic colon cancer - Scheduled robotic-assisted navigation bronchoscopy for lung biopsy on December 9th, 2025. Verbal consent provided. Risks of procedure reviewed. - Will determine if prior imaging is acceptable for procedure, if not will order new CT Chest Scan - Follow up with pathology results within 3-5 days post-biopsy with Lauraine Lites, NP or myself  History of stage IIIB colon cancer, status post right hemicolectomy and adjuvant chemotherapy - new abdominal mass on recent PET imaging - followed by oncology - Pending biopsy procedure for further treatment planning      Return in about 20 days (around 01/25/2024) for f/u visit Dr. Kara.   Dorn KATHEE Kara, MD

## 2024-01-12 ENCOUNTER — Other Ambulatory Visit: Payer: Self-pay

## 2024-01-16 ENCOUNTER — Ambulatory Visit (HOSPITAL_BASED_OUTPATIENT_CLINIC_OR_DEPARTMENT_OTHER): Admission: RE | Admit: 2024-01-16 | Discharge: 2024-01-16 | Attending: Pulmonary Disease

## 2024-01-16 DIAGNOSIS — R918 Other nonspecific abnormal finding of lung field: Secondary | ICD-10-CM

## 2024-01-18 ENCOUNTER — Other Ambulatory Visit: Payer: Self-pay

## 2024-01-18 ENCOUNTER — Encounter (HOSPITAL_COMMUNITY): Payer: Self-pay | Admitting: Emergency Medicine

## 2024-01-18 NOTE — Progress Notes (Signed)
 SDW call  Patient was given pre-op instructions over the phone. Patient verbalized understanding of instructions provided.  He denies SOB, fever or cough.    PCP - [facility] Cardiologist -  Pulmonary:    PPM/ICD - denies Device Orders - na Rep Notified - na   Chest x-ray - CT 01/16/2024 EKG -  DOS, 01/19/2024 Stress Test - ECHO -  Cardiac Cath -   Sleep Study/sleep apnea/CPAP: denies  Non-diabetic  Blood Thinner Instructions: denies Aspirin  Instructions:denies   ERAS Protcol - NPO   Anesthesia review: No  Your procedure is scheduled on Tuesday January 19, 2024  Report to [facility] Main Entrance A at  0530  A.M., then check in with the Admitting office.  Call this number if you have problems the morning of surgery:  [phone]   If you have any questions prior to your surgery date call [phone]: Open Monday-Friday 8am-4pm If you experience any cold or flu symptoms such as cough, fever, chills, shortness of breath, etc. between now and your scheduled surgery, please notify us  at the above number    Remember:  Do not eat or drink after midnight the night before your surgery  Take these medicines the morning of surgery with A SIP OF WATER:  Amlodipine  As of today, STOP taking any Aspirin  (unless otherwise instructed by your surgeon) Aleve, Naproxen, Ibuprofen , Motrin , Advil , Goody's, BC's, all herbal medications, fish oil, and all vitamins.

## 2024-01-19 ENCOUNTER — Inpatient Hospital Stay (HOSPITAL_COMMUNITY)

## 2024-01-19 ENCOUNTER — Ambulatory Visit (HOSPITAL_COMMUNITY): Admitting: Anesthesiology

## 2024-01-19 ENCOUNTER — Ambulatory Visit (HOSPITAL_COMMUNITY)

## 2024-01-19 ENCOUNTER — Inpatient Hospital Stay (HOSPITAL_COMMUNITY)
Admission: RE | Admit: 2024-01-19 | Discharge: 2024-01-23 | DRG: 166 | Disposition: A | Attending: Emergency Medicine | Admitting: Emergency Medicine

## 2024-01-19 ENCOUNTER — Telehealth: Payer: Self-pay | Admitting: Hematology

## 2024-01-19 ENCOUNTER — Encounter (HOSPITAL_COMMUNITY): Admission: RE | Disposition: A | Payer: Self-pay | Source: Home / Self Care | Attending: Emergency Medicine

## 2024-01-19 ENCOUNTER — Encounter (HOSPITAL_COMMUNITY): Payer: Self-pay | Admitting: Emergency Medicine

## 2024-01-19 ENCOUNTER — Inpatient Hospital Stay (HOSPITAL_COMMUNITY): Admission: RE | Disposition: A | Payer: Self-pay | Source: Home / Self Care | Attending: Emergency Medicine

## 2024-01-19 DIAGNOSIS — D6869 Other thrombophilia: Secondary | ICD-10-CM | POA: Diagnosis not present

## 2024-01-19 DIAGNOSIS — I6310 Cerebral infarction due to embolism of unspecified precerebral artery: Secondary | ICD-10-CM | POA: Diagnosis not present

## 2024-01-19 DIAGNOSIS — R918 Other nonspecific abnormal finding of lung field: Principal | ICD-10-CM | POA: Diagnosis present

## 2024-01-19 DIAGNOSIS — E876 Hypokalemia: Secondary | ICD-10-CM

## 2024-01-19 DIAGNOSIS — G620 Drug-induced polyneuropathy: Secondary | ICD-10-CM | POA: Diagnosis present

## 2024-01-19 DIAGNOSIS — I4891 Unspecified atrial fibrillation: Secondary | ICD-10-CM | POA: Diagnosis not present

## 2024-01-19 DIAGNOSIS — Z7982 Long term (current) use of aspirin: Secondary | ICD-10-CM | POA: Diagnosis not present

## 2024-01-19 DIAGNOSIS — I3139 Other pericardial effusion (noninflammatory): Secondary | ICD-10-CM | POA: Diagnosis present

## 2024-01-19 DIAGNOSIS — R29810 Facial weakness: Secondary | ICD-10-CM

## 2024-01-19 DIAGNOSIS — I6389 Other cerebral infarction: Secondary | ICD-10-CM | POA: Diagnosis not present

## 2024-01-19 DIAGNOSIS — I10 Essential (primary) hypertension: Secondary | ICD-10-CM

## 2024-01-19 DIAGNOSIS — C799 Secondary malignant neoplasm of unspecified site: Secondary | ICD-10-CM | POA: Diagnosis present

## 2024-01-19 DIAGNOSIS — N179 Acute kidney failure, unspecified: Secondary | ICD-10-CM | POA: Diagnosis not present

## 2024-01-19 DIAGNOSIS — E785 Hyperlipidemia, unspecified: Secondary | ICD-10-CM | POA: Diagnosis present

## 2024-01-19 DIAGNOSIS — Z7902 Long term (current) use of antithrombotics/antiplatelets: Secondary | ICD-10-CM | POA: Diagnosis not present

## 2024-01-19 DIAGNOSIS — I5021 Acute systolic (congestive) heart failure: Secondary | ICD-10-CM | POA: Diagnosis not present

## 2024-01-19 DIAGNOSIS — I214 Non-ST elevation (NSTEMI) myocardial infarction: Secondary | ICD-10-CM | POA: Diagnosis not present

## 2024-01-19 DIAGNOSIS — Z9911 Dependence on respirator [ventilator] status: Secondary | ICD-10-CM

## 2024-01-19 DIAGNOSIS — R001 Bradycardia, unspecified: Secondary | ICD-10-CM | POA: Diagnosis not present

## 2024-01-19 DIAGNOSIS — I959 Hypotension, unspecified: Secondary | ICD-10-CM | POA: Diagnosis not present

## 2024-01-19 DIAGNOSIS — R9431 Abnormal electrocardiogram [ECG] [EKG]: Secondary | ICD-10-CM

## 2024-01-19 DIAGNOSIS — R29701 NIHSS score 1: Secondary | ICD-10-CM | POA: Diagnosis not present

## 2024-01-19 DIAGNOSIS — R7989 Other specified abnormal findings of blood chemistry: Secondary | ICD-10-CM

## 2024-01-19 DIAGNOSIS — R739 Hyperglycemia, unspecified: Secondary | ICD-10-CM | POA: Diagnosis not present

## 2024-01-19 DIAGNOSIS — I21A1 Myocardial infarction type 2: Secondary | ICD-10-CM | POA: Diagnosis not present

## 2024-01-19 DIAGNOSIS — T451X5A Adverse effect of antineoplastic and immunosuppressive drugs, initial encounter: Secondary | ICD-10-CM | POA: Diagnosis present

## 2024-01-19 DIAGNOSIS — R29702 NIHSS score 2: Secondary | ICD-10-CM | POA: Diagnosis not present

## 2024-01-19 DIAGNOSIS — C182 Malignant neoplasm of ascending colon: Secondary | ICD-10-CM | POA: Diagnosis present

## 2024-01-19 DIAGNOSIS — C189 Malignant neoplasm of colon, unspecified: Secondary | ICD-10-CM | POA: Diagnosis not present

## 2024-01-19 DIAGNOSIS — Z8249 Family history of ischemic heart disease and other diseases of the circulatory system: Secondary | ICD-10-CM | POA: Diagnosis not present

## 2024-01-19 DIAGNOSIS — R579 Shock, unspecified: Secondary | ICD-10-CM

## 2024-01-19 DIAGNOSIS — I130 Hypertensive heart and chronic kidney disease with heart failure and stage 1 through stage 4 chronic kidney disease, or unspecified chronic kidney disease: Secondary | ICD-10-CM | POA: Diagnosis present

## 2024-01-19 DIAGNOSIS — E872 Acidosis, unspecified: Secondary | ICD-10-CM | POA: Diagnosis not present

## 2024-01-19 DIAGNOSIS — Y848 Other medical procedures as the cause of abnormal reaction of the patient, or of later complication, without mention of misadventure at the time of the procedure: Secondary | ICD-10-CM | POA: Diagnosis not present

## 2024-01-19 DIAGNOSIS — Z87891 Personal history of nicotine dependence: Secondary | ICD-10-CM | POA: Diagnosis not present

## 2024-01-19 DIAGNOSIS — I639 Cerebral infarction, unspecified: Secondary | ICD-10-CM | POA: Diagnosis not present

## 2024-01-19 DIAGNOSIS — Z79899 Other long term (current) drug therapy: Secondary | ICD-10-CM | POA: Diagnosis not present

## 2024-01-19 DIAGNOSIS — Y838 Other surgical procedures as the cause of abnormal reaction of the patient, or of later complication, without mention of misadventure at the time of the procedure: Secondary | ICD-10-CM | POA: Diagnosis not present

## 2024-01-19 DIAGNOSIS — Z85828 Personal history of other malignant neoplasm of skin: Secondary | ICD-10-CM | POA: Diagnosis not present

## 2024-01-19 DIAGNOSIS — N1831 Chronic kidney disease, stage 3a: Secondary | ICD-10-CM | POA: Diagnosis present

## 2024-01-19 DIAGNOSIS — I249 Acute ischemic heart disease, unspecified: Secondary | ICD-10-CM | POA: Diagnosis not present

## 2024-01-19 DIAGNOSIS — I6340 Cerebral infarction due to embolism of unspecified cerebral artery: Secondary | ICD-10-CM | POA: Diagnosis not present

## 2024-01-19 DIAGNOSIS — I9581 Postprocedural hypotension: Secondary | ICD-10-CM | POA: Diagnosis not present

## 2024-01-19 DIAGNOSIS — Z9221 Personal history of antineoplastic chemotherapy: Secondary | ICD-10-CM | POA: Diagnosis not present

## 2024-01-19 DIAGNOSIS — T8111XA Postprocedural  cardiogenic shock, initial encounter: Secondary | ICD-10-CM | POA: Diagnosis not present

## 2024-01-19 HISTORY — PX: VIDEO BRONCHOSCOPY WITH ENDOBRONCHIAL NAVIGATION: SHX6175

## 2024-01-19 HISTORY — PX: LEFT HEART CATH AND CORONARY ANGIOGRAPHY: CATH118249

## 2024-01-19 LAB — HEMOGLOBIN A1C
Hgb A1c MFr Bld: 5.2 % (ref 4.8–5.6)
Mean Plasma Glucose: 102.54 mg/dL

## 2024-01-19 LAB — CBC
HCT: 46.6 % (ref 39.0–52.0)
HCT: 41.2 % (ref 39.0–52.0)
Hemoglobin: 16.4 g/dL (ref 13.0–17.0)
Hemoglobin: 14.6 g/dL (ref 13.0–17.0)
MCH: 32.4 pg (ref 26.0–34.0)
MCH: 32.7 pg (ref 26.0–34.0)
MCHC: 35.2 g/dL (ref 30.0–36.0)
MCHC: 35.4 g/dL (ref 30.0–36.0)
MCV: 92.1 fL (ref 80.0–100.0)
MCV: 92.2 fL (ref 80.0–100.0)
Platelets: 356 K/uL (ref 150–400)
Platelets: 328 K/uL (ref 150–400)
RBC: 5.06 MIL/uL (ref 4.22–5.81)
RBC: 4.47 MIL/uL (ref 4.22–5.81)
RDW: 12.4 % (ref 11.5–15.5)
RDW: 12.4 % (ref 11.5–15.5)
WBC: 6.8 K/uL (ref 4.0–10.5)
WBC: 9.2 K/uL (ref 4.0–10.5)
nRBC: 0 % (ref 0.0–0.2)
nRBC: 0 % (ref 0.0–0.2)

## 2024-01-19 LAB — CK TOTAL AND CKMB (NOT AT ARMC)
CK, MB: 0.8 ng/mL (ref 0.5–5.0)
Total CK: 60 U/L (ref 49–397)

## 2024-01-19 LAB — TROPONIN I (HIGH SENSITIVITY)
Troponin I (High Sensitivity): 175 ng/L (ref <18)
Troponin I (High Sensitivity): 23 ng/L — ABNORMAL HIGH (ref <18)
Troponin I (High Sensitivity): 1109 ng/L (ref <18)

## 2024-01-19 LAB — GLUCOSE, CAPILLARY
Glucose-Capillary: 134 mg/dL — ABNORMAL HIGH (ref 70–99)
Glucose-Capillary: 140 mg/dL — ABNORMAL HIGH (ref 70–99)
Glucose-Capillary: 148 mg/dL — ABNORMAL HIGH (ref 70–99)
Glucose-Capillary: 165 mg/dL — ABNORMAL HIGH (ref 70–99)

## 2024-01-19 LAB — BASIC METABOLIC PANEL WITH GFR
Anion gap: 17 — ABNORMAL HIGH (ref 5–15)
Anion gap: 14 (ref 5–15)
BUN: 19 mg/dL (ref 8–23)
BUN: 18 mg/dL (ref 8–23)
CO2: 21 mmol/L — ABNORMAL LOW (ref 22–32)
CO2: 26 mmol/L (ref 22–32)
Calcium: 8.8 mg/dL — ABNORMAL LOW (ref 8.9–10.3)
Calcium: 8.8 mg/dL — ABNORMAL LOW (ref 8.9–10.3)
Chloride: 98 mmol/L (ref 98–111)
Chloride: 96 mmol/L — ABNORMAL LOW (ref 98–111)
Creatinine, Ser: 1.72 mg/dL — ABNORMAL HIGH (ref 0.61–1.24)
Creatinine, Ser: 1.6 mg/dL — ABNORMAL HIGH (ref 0.61–1.24)
GFR, Estimated: 44 mL/min — ABNORMAL LOW (ref >60)
GFR, Estimated: 48 mL/min — ABNORMAL LOW (ref >60)
Glucose, Bld: 122 mg/dL — ABNORMAL HIGH (ref 70–99)
Glucose, Bld: 140 mg/dL — ABNORMAL HIGH (ref 70–99)
Potassium: 2.8 mmol/L — ABNORMAL LOW (ref 3.5–5.1)
Potassium: 3.2 mmol/L — ABNORMAL LOW (ref 3.5–5.1)
Sodium: 136 mmol/L (ref 135–145)
Sodium: 136 mmol/L (ref 135–145)

## 2024-01-19 LAB — POCT I-STAT 7, (LYTES, BLD GAS, ICA,H+H)
Acid-Base Excess: 1 mmol/L (ref 0.0–2.0)
Bicarbonate: 25.4 mmol/L (ref 20.0–28.0)
Calcium, Ion: 1.08 mmol/L — ABNORMAL LOW (ref 1.15–1.40)
HCT: 42 % (ref 39.0–52.0)
Hemoglobin: 14.3 g/dL (ref 13.0–17.0)
O2 Saturation: 97 %
Patient temperature: 97.8
Potassium: 3 mmol/L — ABNORMAL LOW (ref 3.5–5.1)
Sodium: 136 mmol/L (ref 135–145)
TCO2: 27 mmol/L (ref 22–32)
pCO2 arterial: 37.6 mmHg (ref 32–48)
pH, Arterial: 7.436 (ref 7.35–7.45)
pO2, Arterial: 84 mmHg (ref 83–108)

## 2024-01-19 LAB — MAGNESIUM: Magnesium: 1.9 mg/dL (ref 1.7–2.4)

## 2024-01-19 LAB — LACTIC ACID, PLASMA
Lactic Acid, Venous: 1.5 mmol/L (ref 0.5–1.9)
Lactic Acid, Venous: 1.5 mmol/L (ref 0.5–1.9)

## 2024-01-19 LAB — TRIGLYCERIDES: Triglycerides: 40 mg/dL (ref <150)

## 2024-01-19 LAB — CREATININE, SERUM
Creatinine, Ser: 1.62 mg/dL — ABNORMAL HIGH (ref 0.61–1.24)
GFR, Estimated: 47 mL/min — ABNORMAL LOW (ref >60)

## 2024-01-19 LAB — ECHOCARDIOGRAM LIMITED
Height: 72 in
Weight: 3264 [oz_av]

## 2024-01-19 LAB — MRSA NEXT GEN BY PCR, NASAL: MRSA by PCR Next Gen: NOT DETECTED

## 2024-01-19 SURGERY — LEFT HEART CATH AND CORONARY ANGIOGRAPHY
Anesthesia: LOCAL

## 2024-01-19 SURGERY — VIDEO BRONCHOSCOPY WITH ENDOBRONCHIAL NAVIGATION
Anesthesia: General | Laterality: Bilateral

## 2024-01-19 MED ORDER — CHLORHEXIDINE GLUCONATE CLOTH 2 % EX PADS
6.0000 | MEDICATED_PAD | Freq: Every day | CUTANEOUS | Status: DC
  Administered 2024-01-20 – 2024-01-22 (×3): 6 via TOPICAL

## 2024-01-19 MED ORDER — FENTANYL 2500MCG IN NS 250ML (10MCG/ML) PREMIX INFUSION
INTRAVENOUS | Status: AC
  Filled 2024-01-19: qty 250

## 2024-01-19 MED ORDER — CHLORHEXIDINE GLUCONATE 0.12 % MT SOLN
OROMUCOSAL | Status: AC
  Administered 2024-01-19: 15 mL via OROMUCOSAL
  Filled 2024-01-19: qty 15

## 2024-01-19 MED ORDER — CHLORHEXIDINE GLUCONATE 0.12 % MT SOLN: 15.0000 mL | Freq: Once | OROMUCOSAL | Status: AC

## 2024-01-19 MED ORDER — POTASSIUM CHLORIDE 20 MEQ PO PACK
40.0000 meq | PACK | Freq: Four times a day (QID) | ORAL | Status: DC
  Administered 2024-01-19: 40 meq
  Filled 2024-01-19 (×2): qty 2

## 2024-01-19 MED ORDER — MIDAZOLAM HCL 2 MG/2ML IJ SOLN
INTRAMUSCULAR | Status: AC
  Filled 2024-01-19: qty 2

## 2024-01-19 MED ORDER — HEPARIN (PORCINE) IN NACL 1000-0.9 UT/500ML-% IV SOLN
INTRAVENOUS | Status: DC | PRN
  Administered 2024-01-19 (×2): 500 mL

## 2024-01-19 MED ORDER — PROPOFOL 1000 MG/100ML IV EMUL
0.0000 ug/kg/min | INTRAVENOUS | Status: DC
  Administered 2024-01-19: 60 ug/kg/min via INTRAVENOUS
  Filled 2024-01-19: qty 200

## 2024-01-19 MED ORDER — PHENYLEPHRINE HCL-NACL 20-0.9 MG/250ML-% IV SOLN: 25.0000 ug/min | INTRAVENOUS | Status: DC

## 2024-01-19 MED ORDER — FAMOTIDINE 20 MG PO TABS
20.0000 mg | ORAL_TABLET | Freq: Two times a day (BID) | ORAL | Status: DC
  Administered 2024-01-20: 20 mg via ORAL
  Filled 2024-01-19: qty 1

## 2024-01-19 MED ORDER — IOHEXOL 350 MG/ML SOLN
75.0000 mL | Freq: Once | INTRAVENOUS | Status: AC | PRN
  Administered 2024-01-19: 75 mL via INTRAVENOUS

## 2024-01-19 MED ORDER — ORAL CARE MOUTH RINSE: 15.0000 mL | OROMUCOSAL | Status: DC | PRN

## 2024-01-19 MED ORDER — DEXAMETHASONE SOD PHOSPHATE PF 10 MG/ML IJ SOLN
INTRAMUSCULAR | Status: DC | PRN
  Administered 2024-01-19 (×2): 5 mg via INTRAVENOUS

## 2024-01-19 MED ORDER — POLYETHYLENE GLYCOL 3350 17 G PO PACK
17.0000 g | PACK | Freq: Every day | ORAL | Status: DC
  Administered 2024-01-19: 17 g
  Filled 2024-01-19: qty 1

## 2024-01-19 MED ORDER — IPRATROPIUM-ALBUTEROL 0.5-2.5 (3) MG/3ML IN SOLN: 3.0000 mL | Freq: Four times a day (QID) | RESPIRATORY_TRACT | Status: DC | PRN

## 2024-01-19 MED ORDER — FENTANYL CITRATE (PF) 100 MCG/2ML IJ SOLN
INTRAMUSCULAR | Status: AC
  Filled 2024-01-19: qty 2

## 2024-01-19 MED ORDER — DOCUSATE SODIUM 50 MG/5ML PO LIQD
100.0000 mg | Freq: Two times a day (BID) | ORAL | Status: DC
  Administered 2024-01-19: 100 mg
  Filled 2024-01-19: qty 10

## 2024-01-19 MED ORDER — SODIUM CHLORIDE 0.9 % IV SOLN: 250.0000 mL | INTRAVENOUS | Status: AC | PRN

## 2024-01-19 MED ORDER — POLYETHYLENE GLYCOL 3350 17 G PO PACK
17.0000 g | PACK | Freq: Every day | ORAL | Status: DC
  Filled 2024-01-19 (×3): qty 1

## 2024-01-19 MED ORDER — PROPOFOL 10 MG/ML IV BOLUS
INTRAVENOUS | Status: DC | PRN
  Administered 2024-01-19: 110 mg via INTRAVENOUS

## 2024-01-19 MED ORDER — IPRATROPIUM-ALBUTEROL 0.5-2.5 (3) MG/3ML IN SOLN
3.0000 mL | Freq: Four times a day (QID) | RESPIRATORY_TRACT | Status: DC
  Administered 2024-01-19 – 2024-01-22 (×11): 3 mL via RESPIRATORY_TRACT
  Filled 2024-01-19 (×13): qty 3

## 2024-01-19 MED ORDER — EPHEDRINE SULFATE-NACL 50-0.9 MG/10ML-% IV SOSY
PREFILLED_SYRINGE | INTRAVENOUS | Status: DC | PRN
  Administered 2024-01-19: 10 mg via INTRAVENOUS

## 2024-01-19 MED ORDER — LACTATED RINGERS IV SOLN: INTRAVENOUS | Status: AC

## 2024-01-19 MED ORDER — PHENYLEPHRINE 80 MCG/ML (10ML) SYRINGE FOR IV PUSH (FOR BLOOD PRESSURE SUPPORT)
PREFILLED_SYRINGE | INTRAVENOUS | Status: DC | PRN
  Administered 2024-01-19: 160 ug via INTRAVENOUS
  Administered 2024-01-19: 40 ug via INTRAVENOUS
  Administered 2024-01-19 (×2): 80 ug via INTRAVENOUS
  Administered 2024-01-19: 240 ug via INTRAVENOUS

## 2024-01-19 MED ORDER — MIDAZOLAM HCL (PF) 2 MG/2ML IJ SOLN
INTRAMUSCULAR | Status: DC | PRN
  Administered 2024-01-19 (×2): 1 mg via INTRAVENOUS

## 2024-01-19 MED ORDER — FENTANYL CITRATE (PF) 250 MCG/5ML IJ SOLN
INTRAMUSCULAR | Status: DC | PRN
  Administered 2024-01-19 (×2): 50 ug via INTRAVENOUS

## 2024-01-19 MED ORDER — NOREPINEPHRINE 4 MG/250ML-% IV SOLN
INTRAVENOUS | Status: AC
  Administered 2024-01-19: 5 ug/min via INTRAVENOUS
  Filled 2024-01-19: qty 250

## 2024-01-19 MED ORDER — SODIUM CHLORIDE 0.9 % WEIGHT BASED INFUSION: 1.0000 mL/kg/h | INTRAVENOUS | Status: DC

## 2024-01-19 MED ORDER — NOREPINEPHRINE 4 MG/250ML-% IV SOLN
0.0000 ug/min | INTRAVENOUS | Status: DC
  Administered 2024-01-19: 2 ug/min via INTRAVENOUS
  Administered 2024-01-20 – 2024-01-21 (×7): 10 ug/min via INTRAVENOUS
  Administered 2024-01-22: 3 ug/min via INTRAVENOUS
  Administered 2024-01-22: 10 ug/min via INTRAVENOUS
  Filled 2024-01-19 (×10): qty 250

## 2024-01-19 MED ORDER — ONDANSETRON HCL 4 MG/2ML IJ SOLN
INTRAMUSCULAR | Status: DC | PRN
  Administered 2024-01-19: 4 mg via INTRAVENOUS

## 2024-01-19 MED ORDER — LIDOCAINE HCL (PF) 1 % IJ SOLN
INTRAMUSCULAR | Status: AC
  Filled 2024-01-19: qty 30

## 2024-01-19 MED ORDER — POTASSIUM CHLORIDE 10 MEQ/100ML IV SOLN
INTRAVENOUS | Status: DC | PRN
  Administered 2024-01-19 (×3): 10 meq via INTRAVENOUS

## 2024-01-19 MED ORDER — HEPARIN SODIUM (PORCINE) 1000 UNIT/ML IJ SOLN
INTRAMUSCULAR | Status: DC | PRN
  Administered 2024-01-19: 5000 [IU] via INTRAVENOUS

## 2024-01-19 MED ORDER — PHENYLEPHRINE HCL-NACL 20-0.9 MG/250ML-% IV SOLN
INTRAVENOUS | Status: DC | PRN
  Administered 2024-01-19: 20 ug/min via INTRAVENOUS

## 2024-01-19 MED ORDER — IOHEXOL 350 MG/ML SOLN
INTRAVENOUS | Status: DC | PRN
  Administered 2024-01-19: 50 mL

## 2024-01-19 MED ORDER — VERAPAMIL HCL 2.5 MG/ML IV SOLN
INTRAVENOUS | Status: AC
  Filled 2024-01-19: qty 2

## 2024-01-19 MED ORDER — POTASSIUM CHLORIDE 10 MEQ/100ML IV SOLN: 10.0000 meq | INTRAVENOUS | Status: DC

## 2024-01-19 MED ORDER — SODIUM CHLORIDE 0.9 % IV SOLN: INTRAVENOUS | Status: DC

## 2024-01-19 MED ORDER — ONDANSETRON HCL 4 MG/2ML IJ SOLN: 4.0000 mg | Freq: Four times a day (QID) | INTRAMUSCULAR | Status: DC | PRN

## 2024-01-19 MED ORDER — SODIUM CHLORIDE 0.9 % IV SOLN: 250.0000 mL | INTRAVENOUS | Status: AC

## 2024-01-19 MED ORDER — VASOPRESSIN 20 UNIT/ML IV SOLN
INTRAVENOUS | Status: DC | PRN
  Administered 2024-01-19: 2 [IU] via INTRAVENOUS

## 2024-01-19 MED ORDER — PANTOPRAZOLE SODIUM 40 MG IV SOLR: 40.0000 mg | Freq: Every day | INTRAVENOUS | Status: DC

## 2024-01-19 MED ORDER — FAMOTIDINE 20 MG PO TABS
20.0000 mg | ORAL_TABLET | Freq: Two times a day (BID) | ORAL | Status: DC
  Administered 2024-01-19: 20 mg
  Filled 2024-01-19: qty 1

## 2024-01-19 MED ORDER — ENOXAPARIN SODIUM 40 MG/0.4ML IJ SOSY
40.0000 mg | PREFILLED_SYRINGE | INTRAMUSCULAR | Status: AC
  Administered 2024-01-19 – 2024-01-22 (×4): 40 mg via SUBCUTANEOUS
  Filled 2024-01-19 (×4): qty 0.4

## 2024-01-19 MED ORDER — ATROPINE SULFATE 0.4 MG/ML IV SOLN
INTRAVENOUS | Status: DC | PRN
  Administered 2024-01-19: .4 mg via INTRAVENOUS

## 2024-01-19 MED ORDER — PROPOFOL 500 MG/50ML IV EMUL
INTRAVENOUS | Status: DC | PRN
  Administered 2024-01-19: 125 ug/kg/min via INTRAVENOUS

## 2024-01-19 MED ORDER — FENTANYL CITRATE (PF) 50 MCG/ML IJ SOSY
PREFILLED_SYRINGE | INTRAMUSCULAR | Status: AC
  Administered 2024-01-19: 50 ug
  Filled 2024-01-19: qty 1

## 2024-01-19 MED ORDER — DOCUSATE SODIUM 50 MG/5ML PO LIQD
100.0000 mg | Freq: Two times a day (BID) | ORAL | Status: DC
  Filled 2024-01-19 (×5): qty 10

## 2024-01-19 MED ORDER — LACTATED RINGERS IV SOLN: INTRAVENOUS | Status: DC

## 2024-01-19 MED ORDER — HEPARIN SODIUM (PORCINE) 5000 UNIT/ML IJ SOLN: 5000.0000 [IU] | Freq: Three times a day (TID) | INTRAMUSCULAR | Status: DC

## 2024-01-19 MED ORDER — EPINEPHRINE 1 MG/10ML IV SOSY
PREFILLED_SYRINGE | INTRAVENOUS | Status: DC | PRN
  Administered 2024-01-19: .6 mg via INTRAVENOUS

## 2024-01-19 MED ORDER — INSULIN ASPART 100 UNIT/ML IJ SOLN
0.0000 [IU] | INTRAMUSCULAR | Status: DC
  Administered 2024-01-19: 1 [IU] via SUBCUTANEOUS
  Administered 2024-01-19: 2 [IU] via SUBCUTANEOUS
  Administered 2024-01-19: 1 [IU] via SUBCUTANEOUS
  Administered 2024-01-19: 2 [IU] via SUBCUTANEOUS
  Administered 2024-01-20 (×2): 1 [IU] via SUBCUTANEOUS
  Filled 2024-01-19 (×3): qty 1
  Filled 2024-01-19: qty 2
  Filled 2024-01-19: qty 1

## 2024-01-19 MED ORDER — ROCURONIUM BROMIDE 10 MG/ML (PF) SYRINGE
PREFILLED_SYRINGE | INTRAVENOUS | Status: DC | PRN
  Administered 2024-01-19: 70 mg via INTRAVENOUS
  Administered 2024-01-19: 10 mg via INTRAVENOUS
  Administered 2024-01-19: 20 mg via INTRAVENOUS

## 2024-01-19 MED ORDER — ACETAMINOPHEN 325 MG PO TABS
650.0000 mg | ORAL_TABLET | ORAL | Status: DC | PRN
  Administered 2024-01-21 – 2024-01-22 (×2): 650 mg via ORAL
  Filled 2024-01-19 (×2): qty 2

## 2024-01-19 MED ORDER — SODIUM CHLORIDE 0.9% FLUSH: 3.0000 mL | INTRAVENOUS | Status: DC | PRN

## 2024-01-19 MED ORDER — MIDAZOLAM HCL (PF) 2 MG/2ML IJ SOLN
2.0000 mg | INTRAMUSCULAR | Status: DC | PRN
  Administered 2024-01-19: 2 mg via INTRAVENOUS

## 2024-01-19 MED ORDER — ASPIRIN 81 MG PO CHEW: 81.0000 mg | CHEWABLE_TABLET | ORAL | Status: DC

## 2024-01-19 MED ORDER — FAMOTIDINE 20 MG PO TABS: 20.0000 mg | ORAL_TABLET | Freq: Two times a day (BID) | ORAL | Status: DC

## 2024-01-19 MED ORDER — FENTANYL BOLUS VIA INFUSION: 25.0000 ug | INTRAVENOUS | Status: DC | PRN

## 2024-01-19 MED ORDER — FENTANYL CITRATE (PF) 100 MCG/2ML IJ SOLN: 25.0000 ug | Freq: Once | INTRAMUSCULAR | Status: AC

## 2024-01-19 MED ORDER — VERAPAMIL HCL 2.5 MG/ML IV SOLN
INTRAVENOUS | Status: DC | PRN
  Administered 2024-01-19: 10 mL via INTRA_ARTERIAL

## 2024-01-19 MED ORDER — HEPARIN SODIUM (PORCINE) 1000 UNIT/ML IJ SOLN
INTRAMUSCULAR | Status: AC
  Filled 2024-01-19: qty 10

## 2024-01-19 MED ORDER — SODIUM CHLORIDE 0.9% FLUSH
3.0000 mL | Freq: Two times a day (BID) | INTRAVENOUS | Status: DC
  Administered 2024-01-20 – 2024-01-22 (×6): 3 mL via INTRAVENOUS

## 2024-01-19 MED ORDER — FENTANYL 2500MCG IN NS 250ML (10MCG/ML) PREMIX INFUSION
0.0000 ug/h | INTRAVENOUS | Status: DC
  Administered 2024-01-19: 50 ug/h via INTRAVENOUS

## 2024-01-19 MED ORDER — SODIUM CHLORIDE 0.9 % WEIGHT BASED INFUSION: 3.0000 mL/kg/h | INTRAVENOUS | Status: DC

## 2024-01-19 MED ORDER — ORAL CARE MOUTH RINSE
15.0000 mL | OROMUCOSAL | Status: DC
  Administered 2024-01-19 (×2): 15 mL via OROMUCOSAL

## 2024-01-19 MED ORDER — POTASSIUM CHLORIDE 10 MEQ/100ML IV SOLN
10.0000 meq | INTRAVENOUS | Status: AC
  Administered 2024-01-19: 10 meq via INTRAVENOUS

## 2024-01-19 MED ORDER — POTASSIUM CHLORIDE 10 MEQ/100ML IV SOLN
10.0000 meq | INTRAVENOUS | Status: AC
  Administered 2024-01-19 – 2024-01-20 (×6): 10 meq via INTRAVENOUS
  Filled 2024-01-19: qty 100

## 2024-01-19 SURGICAL SUPPLY — 36 items
ADAPTER BRONCHOSCOPE OLYMPUS (ADAPTER) ×1 IMPLANT
ADAPTER VALVE BIOPSY EBUS (MISCELLANEOUS) IMPLANT
BAG COUNTER SPONGE SURGICOUNT (BAG) ×1 IMPLANT
BRUSH CYTOL CELLEBRITY 1.5X140 (MISCELLANEOUS) ×1 IMPLANT
BRUSH SUPERTRAX BIOPSY (INSTRUMENTS) IMPLANT
BRUSH SUPERTRAX NDL-TIP CYTO (INSTRUMENTS) ×1 IMPLANT
CANISTER SUCTION 3000ML PPV (SUCTIONS) ×1 IMPLANT
CNTNR URN SCR LID CUP LEK RST (MISCELLANEOUS) ×1 IMPLANT
COVER BACK TABLE 60X90IN (DRAPES) ×1 IMPLANT
FILTER STRAW FLUID ASPIR (MISCELLANEOUS) IMPLANT
FORCEPS BIOP 1.5 SINGLE USE (MISCELLANEOUS) ×1 IMPLANT
FORCEPS BIOP SUPERTRX PREMAR (INSTRUMENTS) ×1 IMPLANT
GAUZE SPONGE 4X4 12PLY STRL (GAUZE/BANDAGES/DRESSINGS) ×1 IMPLANT
GLOVE BIO SURGEON STRL SZ7.5 (GLOVE) ×2 IMPLANT
GOWN STRL REUS W/ TWL LRG LVL3 (GOWN DISPOSABLE) ×2 IMPLANT
KIT CLEAN ENDO COMPLIANCE (KITS) ×1 IMPLANT
KIT LOCATABLE GUIDE (CANNULA) IMPLANT
KIT MARKER FIDUCIAL DELIVERY (KITS) IMPLANT
KIT TURNOVER KIT B (KITS) ×1 IMPLANT
MARKER SKIN DUAL TIP RULER LAB (MISCELLANEOUS) ×1 IMPLANT
NDL SUPERTRX PREMARK BIOPSY (NEEDLE) ×1 IMPLANT
NEEDLE SUPERTRX PREMARK BIOPSY (NEEDLE) ×1 IMPLANT
OIL SILICONE PENTAX (PARTS (SERVICE/REPAIRS)) ×1 IMPLANT
PAD ARMBOARD POSITIONER FOAM (MISCELLANEOUS) ×2 IMPLANT
PATCHES PATIENT (LABEL) ×3 IMPLANT
SOLN 0.9% NACL POUR BTL 1000ML (IV SOLUTION) ×1 IMPLANT
SOLN STERILE WATER BTL 1000 ML (IV SOLUTION) ×1 IMPLANT
SYR 20ML ECCENTRIC (SYRINGE) ×1 IMPLANT
SYR 20ML LL LF (SYRINGE) ×1 IMPLANT
SYR 50ML SLIP (SYRINGE) ×1 IMPLANT
TOWEL GREEN STERILE FF (TOWEL DISPOSABLE) ×1 IMPLANT
TRAP SPECIMEN MUCUS 40CC (MISCELLANEOUS) IMPLANT
TUBE CONNECTING 20X1/4 (TUBING) ×1 IMPLANT
UNDERPAD 30X36 HEAVY ABSORB (UNDERPADS AND DIAPERS) ×1 IMPLANT
VALVE BIOPSY SINGLE USE (MISCELLANEOUS) ×1 IMPLANT
VALVE SUCTION BRONCHIO DISP (MISCELLANEOUS) ×1 IMPLANT

## 2024-01-19 SURGICAL SUPPLY — 9 items
CATH 5FR JL3.5 JR4 ANG PIG MP (CATHETERS) IMPLANT
DEVICE RAD COMP TR BAND LRG (VASCULAR PRODUCTS) IMPLANT
ELECT DEFIB PAD ADLT CADENCE (PAD) IMPLANT
GLIDESHEATH SLEND SS 6F .021 (SHEATH) IMPLANT
GUIDEWIRE INQWIRE 1.5J.035X260 (WIRE) IMPLANT
MAT PREVALON FULL STRYKER (MISCELLANEOUS) IMPLANT
PACK CARDIAC CATHETERIZATION (CUSTOM PROCEDURE TRAY) ×1 IMPLANT
SET ATX-X65L (MISCELLANEOUS) IMPLANT
SHEATH PROBE COVER 6X72 (BAG) IMPLANT

## 2024-01-19 NOTE — Progress Notes (Signed)
 Patient transported to procedure in cath lab 3 without complication and back to 2H06.

## 2024-01-19 NOTE — Consult Note (Signed)
 NEUROLOGY CONSULT NOTE   Date of service: January 19, 2024 Patient Name: Andrew Hines MRN:  [id number] DOB:  1960/10/06 Chief Complaint: Left facial droop Requesting Provider: Gretta Leita SQUIBB, DO  History of Present Illness  Andrew Hines is a 63 y.o. male with hx of stage III colon cancer status post hemicolectomy and chemotherapy in 2022, referred to pulmonology after PET scan in November 2025 identified a new hypermetabolic mesenteric mass as well as multiple small hypermetabolic lung nodules.  He underwent robotic assisted fiberoptic bronchoscopy today, was intubated and targeted biopsies were obtained.  Intraoperatively he developed acute hypotension with systolic pressures in the 50s following recruitment maneuver.  He then had bradycardia and ST elevation in a couple of leads, treated with atropine , started on norepinephrine  and received phenylephrine  with cardiology involved-stat bedside TTE showed LVEF 45%, chest x-ray negative for pneumothorax, he was taken in for cardiac cath which was clean. He was in the ICU recovering, had a  swallow evaluation around 2:30 PM which he passed and was fine and later in the evening family noted that he had some left-sided facial droop and mild slurred speech.  Code stroke was activated as he was still within the window for treatment.  He did review his photo and the selfie mode on the cell phone camera and thought that the left side of his face was definitely feeling droopy.  He also was noted to have a mild left pronator drift.  LKW: 1445 hrs Modified rankin score: 0-Completely asymptomatic and back to baseline post- stroke IV Thrombolysis: No - recent lung biopsy, heparin  IV for cath, lovenox  sq, low NIHSS makes the risk of IVT unnecessary  EVT: No ELVO  NIHSS components Score: Comment  1a Level of Conscious 0[x]  1[]  2[]  3[]      1b LOC Questions 0[x]  1[]  2[]       1c LOC Commands 0[x]  1[]  2[]       2 Best Gaze 0[x]  1[]  2[]       3 Visual 0[x]  1[]   2[]  3[]      4 Facial Palsy 0[]  1[x]  2[]  3[]      5a Motor Arm - left 0[x]  1[]  2[]  3[]  4[]  UN[]    5b Motor Arm - Right 0[x]  1[]  2[]  3[]  4[]  UN[]    6a Motor Leg - Left 0[x]  1[]  2[]  3[]  4[]  UN[]    6b Motor Leg - Right 0[x]  1[]  2[]  3[]  4[]  UN[]    7 Limb Ataxia 0[x]  1[]  2[]  UN[]      8 Sensory 0[x]  1[]  2[]  UN[]      9 Best Language 0[x]  1[]  2[]  3[]      10 Dysarthria 0[]  1[x]  2[]  UN[]      11 Extinct. and Inattention 0[x]  1[]  2[]       TOTAL: 2      ROS  Comprehensive ROS performed and pertinent positives documented in HPI   Past History   Past Medical History:  Diagnosis Date   Colon cancer (HCC) 04/17/2020   HBP (high blood pressure) 2025   Pneumonia    2020    Skin cancer, basal cell     Past Surgical History:  Procedure Laterality Date   APPENDECTOMY     COLONOSCOPY  2025   LAPAROSCOPIC RIGHT HEMI COLECTOMY N/A 06/13/2020   Procedure: LAPAROSCOPIC RIGHT HEMI COLECTOMY AND PARTIAL OMENTECTOMY WITH LYSIS OF ADHESIONS;  Surgeon: Teresa Lonni HERO, MD;  Location: [facility];  Service: General;  Laterality: N/A;    Family History: Family History  Problem Relation Age of Onset  Colon polyps Mother    Hypertension Mother    Lung cancer Father        d. lung cancer   Prostate cancer Father        metastatic prostate   Alcohol abuse Father    Hypertension Sister    Hypertension Brother    Cancer Paternal Uncle        Prostate or colon cancer   Cancer Paternal Uncle        Prostate or colon cancer   Cancer Paternal Uncle        Prostate or colon cancer   Cancer Paternal Uncle        Prostate or colon cancer   Cancer Paternal Uncle        Prostate or colon cancer   Cancer Paternal Uncle        Prostate or colon cancer   Cancer Cousin        paternal, lung    Social History  reports that he quit smoking about 32 years ago. His smoking use included cigarettes. He started smoking about 37 years ago. He has a 5 pack-year smoking history. He has never used smokeless tobacco.  He reports that he does not currently use alcohol after a past usage of about 14.0 standard drinks of alcohol per week. He reports that he does not use drugs.  No Known Allergies  Medications   Current Facility-Administered Medications:    0.9 %  sodium chloride  infusion, 250 mL, Intravenous, Continuous, Jordan, Peter M, MD, Held at 01/19/24 1043   0.9 %  sodium chloride  infusion, 250 mL, Intravenous, PRN, Jordan, Peter M, MD   0.9 %  sodium chloride  infusion, , Intravenous, Continuous, Kyion Gautier, MD   acetaminophen  (TYLENOL ) tablet 650 mg, 650 mg, Oral, Q4H PRN, Jordan, Peter M, MD   docusate (COLACE) 50 MG/5ML liquid 100 mg, 100 mg, Oral, BID, Uhlorn, Garett M, RPH   enoxaparin  (LOVENOX ) injection 40 mg, 40 mg, Subcutaneous, Q24H, Gretta Leita SQUIBB, DO, 40 mg at 01/19/24 1422   famotidine  (PEPCID ) tablet 20 mg, 20 mg, Oral, BID, Uhlorn, Garett M, RPH   insulin  aspart (novoLOG ) injection 0-9 Units, 0-9 Units, Subcutaneous, Q4H, Babcock, Peter E, NP, 2 Units at 01/19/24 1700   ipratropium-albuterol  (DUONEB) 0.5-2.5 (3) MG/3ML nebulizer solution 3 mL, 3 mL, Nebulization, Q6H, Jordan, Peter M, MD, 3 mL at 01/19/24 1307   ipratropium-albuterol  (DUONEB) 0.5-2.5 (3) MG/3ML nebulizer solution 3 mL, 3 mL, Nebulization, Q6H PRN, Jordan, Peter M, MD   norepinephrine  (LEVOPHED ) 4mg  in (0.016 mg/mL) premix infusion, 0-10 mcg/min, Intravenous, Titrated, Jordan, Peter M, MD, Stopped at 01/19/24 1354   ondansetron  (ZOFRAN ) injection 4 mg, 4 mg, Intravenous, Q6H PRN, Jordan, Peter M, MD   Oral care mouth rinse, 15 mL, Mouth Rinse, PRN, Jordan, Peter M, MD   NOREEN ON 01/20/2024] polyethylene glycol (MIRALAX  / GLYCOLAX ) packet 17 g, 17 g, Oral, Daily, Uhlorn, Garett M, RPH   sodium chloride  flush (NS) 0.9 % injection 3 mL, 3 mL, Intravenous, Q12H, Jordan, Peter M, MD   sodium chloride  flush (NS) 0.9 % injection 3 mL, 3 mL, Intravenous, PRN, Jordan, Peter M, MD  Vitals   Vitals:   01/19/24 1530  01/19/24 1545 01/19/24 1600 01/19/24 1609  BP:    94/78  Pulse: 80 91 83   Resp: 12 13 15 13   Temp:    98 F (36.7 C)  TempSrc:    Oral  SpO2: 100% 99% 97% 96%  Weight:  Height:        Body mass index is 27.67 kg/m.   Physical Exam   General: He is awake alert oriented x 3 HEENT: Normocephalic atraumatic Lungs: Clear Abdomen nondistended nontender Neurological exam He is awake alert oriented x 3 Mildly dysarthric speech No evidence of aphasia Cranial nerves II to XII: Pupils equal round react light, extraocular movements intact, visual fields full, facial symmetry is distorted-she has mild left lower facial weakness which she is able to compensate for when he smiles, facial sensation intact, tongue and palate midline. Motor examination with no drift in any of the 4 extremities but on individual muscle testing his left upper and lower extremities are 4+/5 when the right side is 5/5. Sensation is intact to light touch Coordination examination reveals no dysmetria  Labs/Imaging/Neurodiagnostic studies   CBC:  Recent Labs  Lab 2024/01/27 0636 01-27-24 1446  WBC 6.8 9.2  HGB 16.4 14.6  HCT 46.6 41.2  MCV 92.1 92.2  PLT 356 328   Basic Metabolic Panel:  Lab Results  Component Value Date   NA 136 01-27-24   K 2.8 (L) January 27, 2024   CO2 21 (L) 01-27-2024   GLUCOSE 122 (H) Jan 27, 2024   BUN 19 2024/01/27   CREATININE 1.62 (H) 01/27/24   CALCIUM  8.8 (L) 2024/01/27   GFRNONAA 47 (L) January 27, 2024    HgbA1c:  Lab Results  Component Value Date   HGBA1C 5.2 2024/01/27   INR  Lab Results  Component Value Date   INR 1.0 06/13/2020   APTT  Lab Results  Component Value Date   APTT 35 06/13/2020    CT Head without contrast(Personally reviewed): Possible right caudate hypodensity reflecting an acute stroke.  No bleed.  Aspects 9  CT angio Head and Neck with contrast(Personally reviewed): No ELVO   ASSESSMENT   KIJANA CROMIE is a 62 y.o. male with above  past medical history with sudden onset of left-sided facial droop and possible left arm weakness noted in the ICU after he has undergone robotic assisted lung biopsy and cardiac catheterization for STEMI today.  He received heparin  during catheterization.  He also received subcu Lovenox  for DVT prophylaxis. On examination, he has a subtle left lower facial weakness and on full neurological examination, it does appear that his left upper and lower extremities are mildly weaker than the right. I had an extensive discussions of risks, benefits and alternatives of IV TNKase especially in the setting of recent surgery-lung nodule biopsy which will put him at a very high risk for bleeding. I discussed this with the interventionalist Dr. Gretta as well, who had discussed this with the proceduralist Dr. Shelah, both of whom are of the opinion that TNK use should be done with abundant caution that is if it is absolutely necessary. After careful consideration of risks benefits and alternatives, the patient agreed did not go ahead with IV TNKase due to the abnormally high risk of bleeding which outweighs any benefits that he might get from it. I suspect that he has had a small stroke and requires further workup  RECOMMENDATIONS  Frequent neurochecks Telemetry MRI brain without contrast Avoid hypotension Keep systolic blood pressure above 879 at all times.  I would recommend permissive hypertension up to 220 if possible and only treat if the systolic is more than 220 but given his recent cardiac cath, I would defer this high range to what ever the safest ranges per cardiology/CCM. He is currently on aspirin -continue aspirin  Plavix  might be able to  be started tomorrow, which should be fine from a stroke perspective as well High intensity statin for goal LDL less than 70 Check A1c and lipid panel Check full echocardiogram Therapy assessments Plan was discussed with Dr. Carlyon Gaskins, CCM at the patient's  bedside ______________________________________________________________________    Signed, Eligio Lav, MD [facility]

## 2024-01-19 NOTE — TOC Initial Note (Signed)
 Transition of Care [facility]) - Initial/Assessment Note    Patient Details  Name: Andrew Hines MRN: [id number] Date of Birth: 1960/09/18  Transition of Care [facility]) CM/SW Contact:    Justina Delcia Czar, RN Phone Number: [phone] 01/19/2024, 5:43 PM  Clinical Narrative:                 Spoke to pt and SO at bedside. Pt states he was independent pta. States he drives to appt. Currently will have family assist him until he has car repaired.  HH or DME needs identified.    Will scheduled PCP hospital follow up appt at dc.   Expected Discharge Plan: Home/Self Care Barriers to Discharge: Continued Medical Work up   Patient Goals and CMS Choice Patient states their goals for this hospitalization and ongoing recovery are:: wants to recover          Expected Discharge Plan and Services   Discharge Planning Services: CM Consult       Prior Living Arrangements/Services   Lives with:: Significant Other Patient language and need for interpreter reviewed:: Yes Do you feel safe going back to the place where you live?: Yes      Need for Family Participation in Patient Care: No (Comment) Care giver support system in place?: No (comment)   Criminal Activity/Legal Involvement Pertinent to Current Situation/Hospitalization: No - Comment as needed  Activities of Daily Living   ADL Screening (condition at time of admission) Independently performs ADLs?: Yes (appropriate for developmental age) Is the patient deaf or have difficulty hearing?: No Does the patient have difficulty seeing, even when wearing glasses/contacts?: No Does the patient have difficulty concentrating, remembering, or making decisions?: No  Permission Sought/Granted Permission sought to share information with : Case Manager, Family Supports, PCP Permission granted to share information with : Yes, Verbal Permission Granted  Share Information with NAME: Manuelita Baumgarten  Permission granted to share info w AGENCY:  PCP  Permission granted to share info w Relationship: daughter  Permission granted to share info w Contact Information: [phone]  Emotional Assessment Appearance:: Appears stated age Attitude/Demeanor/Rapport: Engaged Affect (typically observed): Accepting Orientation: : Oriented to Self, Oriented to Place, Oriented to  Time, Oriented to Situation   Psych Involvement: No (comment)  Admission diagnosis:  Lung nodules [R91.8] NSTEMI (non-ST elevated myocardial infarction) [facility] [I21.4] Patient Active Problem List   Diagnosis Date Noted   Lung nodules 01/05/2024   Malignant neoplasm of ascending colon (HCC) 07/03/2020   S/P right hemicolectomy 06/13/2020   PCP:  [facility] [address] Pharmacy:   D[facility] LONG - [facility] [address] [place] [address] Phone: [phone] Fax: [phone]  [facility] [address] [place]), [place] - [address] [place] [address] Phone: [phone] Fax: [phone]  [name] [address] [place] [address] Phone: [phone] Fax: [phone]     Social Drivers of Health (SDOH) Social History: SDOH Screenings   Food Insecurity: No Food Insecurity (01/19/2024)  Housing: Low Risk  (01/19/2024)  Transportation Needs: No Transportation Needs (01/19/2024)  Utilities: Not At Risk (01/19/2024)  Depression (PHQ2-9): Low Risk  (12/31/2023)  Social Connections: Unknown (01/19/2024)  Tobacco Use: Medium Risk (01/19/2024)   SDOH Interventions:     Readmission Risk Interventions     No data to display

## 2024-01-19 NOTE — Interval H&P Note (Signed)
 History and Physical Interval Note:  01/19/2024 7:21 AM  Andrew Hines  has presented today for surgery, with the diagnosis of Lung Nodules and hx of colon cancer.  The various methods of treatment have been discussed with the patient and family. After consideration of risks, benefits and other options for treatment, the patient has consented to  Procedure(s): VIDEO BRONCHOSCOPY WITH ENDOBRONCHIAL NAVIGATION (Bilateral) as a surgical intervention.  The patient's history has been reviewed, patient examined, no change in status, stable for surgery.  I have reviewed the patient's chart and labs.  Questions were answered to the patient's satisfaction.     Lamar GORMAN Chris

## 2024-01-19 NOTE — H&P (Signed)
 NAME:  Andrew Hines, MRN:  [id number], DOB:  Jun 10, 1960, LOS: 0 ADMISSION DATE:  01/19/2024, CONSULTATION DATE:  12/9 REFERRING MD:  byrum, CHIEF COMPLAINT:  shock    History of Present Illness:  63 year old male patient with a past medical history of stage III colon cancer status post hemicolectomy and chemotherapy back in 2022, had been followed by oncology, and referred to pulmonary after PET scan in November 2025 identified new hypermetabolic mesenteric mass as well as multiple small hypermetabolic lung nodules. He was brought to [facility] on 12/9 for robotic assisted fiberoptic bronchoscopy, he was intubated by anesthesia for airway protection, target biopsies were obtained from left upper and lower lobe pulmonary nodules.  Intraoperatively the patient developed acute hypotension with systolic blood pressure in the 50s following  a recruitment maneuver.  He then became progressively bradycardic, and developed ST elevation lead II and III  He was treated with atropine , started on norepinephrine , had also received phenylephrine , critical care was emergently summoned to bedside as was advanced heart failure.  A stat bedside TTE was obtained showing LVEF estimated at 45% chest x-ray was negative for pneumothorax. He was transferred subsequently to the intensive care for further cardiac evaluation and stabilization.  Pertinent  Medical History  Stage 3 colon cancer with right hemicolectomy back in 2022 with adjuvant chemotherapy CAPOX x 3 months PET scan November 2025 showed hypermetabolic mesenteric mass with multiple small hypermetabolic lung nodules Hypertension Basal cell skin cancer Significant Hospital Events: Including procedures, antibiotic start and stop dates in addition to other pertinent events   12/9 presented for elective bronchoscopy and biopsy of lung nodules.  Developed intraoperative bradycardia hypotension ST elevation and shock.  Started on vasopressors, emergent bedside TTE  estimated EF at 45% transferred to the intensive care for further evaluation, cardiac workup, and stabilization  Interim History / Subjective:  Currently sedated on propofol   Objective    Blood pressure 138/79, pulse 98, temperature 98.5 F (36.9 C), temperature source Oral, resp. rate 16, height 6' (1.829 m), weight 92.5 kg, SpO2 99%.    Vent Mode: PRVC FiO2 (%):  [60 %] 60 % Set Rate:  [16 bmp] 16 bmp Vt Set:  [620 mL] 620 mL PEEP:  [5 cmH20] 5 cmH20 Plateau Pressure:  [20 cmH20] 20 cmH20  No intake or output data in the 24 hours ending 01/19/24 1034 Filed Weights   01/18/24 1628 01/19/24 0606  Weight: 92.1 kg 92.5 kg    Examination: General: 63 year old male patient currently sedated on propofol  infusion, requiring low-dose norepinephrine  HENT: Orally intubated no clear JVD pupils equal reactive Lungs: Clear bilaterally.  Portable chest x-ray showing Endotracheal tube in satisfactory position.  No acute cardiac or pulmonary findings arterial blood gas reviewed Cardiac regular rate and rhythm without murmur rub or gallop.  Current heart rate 93.  Twelve-lead EKG with ST elevation in lead II and III Extremities warm dry brisk capillary refill pulses are palpable Abdomen soft nontender no organomegaly Neuro: Sedated GU due to void  Resolved problem list   Assessment and Plan   Undifferentiated shock, favoring cardiogenic with ST elevation in the inferior leads and bradycardia bedside EF estimated at 45% Plan Continuing supportive care Ensure mean arterial pressure greater than 65 given concern about right ventricular infarct Changing phenylephrine  to norepinephrine  Follow-up lactic acid Serial troponins and twelve-lead's Holding antihypertensive and any beta-blockade given recent shock state He will emergently go to the cardiac Cath Lab for diagnostic evaluation  Anion gap metabolic acidosis.  Likely  lactic acidosis Plan Follow-up lactate  Ventilator  management Intubated for airway protection for bronchoscopy.  Recruitment maneuver was carried out for visualization not hypoxia Nothing acute on chest x-ray, arterial blood gas reviewed and satisfactory Plan Continue full ventilator support for now VAP bundle PAD protocol with RASS goal -2 A.m. chest x-ray  CKD stage III AA baseline serum creatinine around 1.3 Serum creatinine elevated to 1.72, high risk for AKI Plan Ensure adequate mean arterial pressure Renal dose medications Strict intake output Serial labs Place Foley  Hypokalemia Plan Replace Recheck  Multiple pulmonary nodules, now status post robotic guided biopsy Plan Follow-up pathology Labs   CBC: Recent Labs  Lab 01/19/24 0636  WBC 6.8  HGB 16.4  HCT 46.6  MCV 92.1  PLT 356    Basic Metabolic Panel: Recent Labs  Lab 01/19/24 0636  NA 136  K 2.8*  CL 98  CO2 21*  GLUCOSE 122*  BUN 19  CREATININE 1.72*  CALCIUM  8.8*   GFR: Estimated Creatinine Clearance: 47 mL/min (A) (by C-G formula based on SCr of 1.72 mg/dL (H)). Recent Labs  Lab 01/19/24 0636  WBC 6.8    Liver Function Tests: No results for input(s): AST, ALT, ALKPHOS, BILITOT, PROT, ALBUMIN in the last 168 hours. No results for input(s): LIPASE, AMYLASE in the last 168 hours. No results for input(s): AMMONIA in the last 168 hours.  ABG No results found for: PHART, PCO2ART, PO2ART, HCO3, TCO2, ACIDBASEDEF, O2SAT   Coagulation Profile: No results for input(s): INR, PROTIME in the last 168 hours.  Cardiac Enzymes: Recent Labs  Lab 01/19/24 0940  CKTOTAL 60  CKMB 0.8    HbA1C: Hgb A1c MFr Bld  Date/Time Value Ref Range Status  06/13/2020 12:50 PM 5.6 4.8 - 5.6 % Final    Comment:    (NOTE) Pre diabetes:          5.7%-6.4%  Diabetes:              >6.4%  Glycemic control for   <7.0% adults with diabetes     CBG: No results for input(s): GLUCAP in the last 168 hours.  Review  of Systems:   Not able   Past Medical History:  He,  has a past medical history of Colon cancer (HCC) (04/17/2020), HBP (high blood pressure) (2025), Pneumonia, and Skin cancer, basal cell.   Surgical History:   Past Surgical History:  Procedure Laterality Date   APPENDECTOMY     COLONOSCOPY  2025   LAPAROSCOPIC RIGHT HEMI COLECTOMY N/A 06/13/2020   Procedure: LAPAROSCOPIC RIGHT HEMI COLECTOMY AND PARTIAL OMENTECTOMY WITH LYSIS OF ADHESIONS;  Surgeon: Teresa Lonni HERO, MD;  Location: [facility];  Service: General;  Laterality: N/A;     Social History:   reports that he quit smoking about 32 years ago. His smoking use included cigarettes. He started smoking about 37 years ago. He has a 5 pack-year smoking history. He has never used smokeless tobacco. He reports that he does not currently use alcohol after a past usage of about 14.0 standard drinks of alcohol per week. He reports that he does not use drugs.   Family History:  His family history includes Alcohol abuse in his father; Cancer in his cousin, paternal uncle, paternal uncle, paternal uncle, paternal uncle, paternal uncle, and paternal uncle; Colon polyps in his mother; Hypertension in his brother, mother, and sister; Lung cancer in his father; Prostate cancer in his father.   Allergies No Known Allergies   Home Medications  Prior to Admission medications   Medication Sig Start Date End Date Taking? Authorizing Provider  amLODipine (NORVASC) 5 MG tablet Take 5 mg by mouth daily.   Yes [provider]  diphenhydrAMINE  HCl, Sleep, 50 MG CAPS Take 100 mg by mouth at bedtime.   Yes [provider]  hydrochlorothiazide (HYDRODIURIL) 25 MG tablet Take 25 mg by mouth daily. 09/11/23  Yes [provider]  Tetrahydrozoline HCl (VISINE OP) Place 1 drop into both eyes daily.   Yes [provider]  capecitabine  (XELODA ) 500 MG tablet Take 3 tabs in morning and 4 tabs in evening for 14 days then off 7 days.  Repeat every 21 days. Take within 30 minutes after meals. Patient not taking: Reported on 01/18/2024 09/24/20   Lanny Callander, MD  Cholecalciferol (VITAMIN D) 50 MCG (2000 UT) tablet Take 2,000 Units by mouth daily. Patient not taking: Reported on 01/18/2024    [provider]  ondansetron  (ZOFRAN ) 8 MG tablet Take 1 tablet (8 mg total) by mouth 2 (two) times daily as needed for refractory nausea / vomiting. Start on day 3 after chemotherapy. Patient not taking: Reported on 01/18/2024 07/23/20   Lanny Callander, MD  potassium chloride  SA (KLOR-CON  M) 20 MEQ tablet Take 2 tablets (40 mEq total) by mouth daily for 3 days, THEN 1 tablet (20 mEq total) daily for 3 days. Patient not taking: Reported on 01/18/2024 11/27/23 01/05/24  Craig Alan SAUNDERS, PA-C  prochlorperazine  (COMPAZINE ) 10 MG tablet Take 1 tablet (10 mg total) by mouth every 6 (six) hours as needed (Nausea or vomiting). Patient not taking: Reported on 01/18/2024 07/04/20   Lanny Callander, MD  sildenafil (VIAGRA) 100 MG tablet Take 100 mg by mouth daily as needed for erectile dysfunction.    [provider]     Critical care time:  I personally  spent 32 minutes  on this patient which included: review of medical records, nursing notes, progress notes, evaluation, interpretation of lab data and diagnostic studies, taking independent history, performing exam, documenting plan, ordering diagnostics and interventions for the following critical care issues: Circulatory shock with the following interventions which included: titration of hemodynamic drips to desired MAP

## 2024-01-19 NOTE — Progress Notes (Signed)
 Belongings bag brought to pt 2H room from pacu. Pt awake and notified of his belongings back with him

## 2024-01-19 NOTE — Interval H&P Note (Signed)
 History and Physical Interval Note:  01/19/2024 11:39 AM  Starleen LULLA Kitty  has presented today for surgery, with the diagnosis of nstemi.  The various methods of treatment have been discussed with the patient and family. After consideration of risks, benefits and other options for treatment, the patient has consented to  Procedure(s): LEFT HEART CATH AND CORONARY ANGIOGRAPHY (N/A) as a surgical intervention.  The patient's history has been reviewed, patient examined, no change in status, stable for surgery.  I have reviewed the patient's chart and labs.  Questions were answered to the patient's satisfaction.   Cath Lab Visit (complete for each Cath Lab visit)  Clinical Evaluation Leading to the Procedure:   ACS: Yes.    Non-ACS:    Anginal Classification: CCS III  Anti-ischemic medical therapy: No Therapy  Non-Invasive Test Results: No non-invasive testing performed  Prior CABG: No previous CABG        Maude [facility] 01/19/2024 11:39 AM

## 2024-01-19 NOTE — Code Documentation (Signed)
 Stroke Response Nurse Documentation Code Documentation  Andrew Hines is a 63 y.o. male admitted to [name]  on 12/9 with medical hx of colon CA, HTN. Received both heparin  IV for heart cath today as well as sq lovenox . Code stroke was activated by [facility] RN .   Patient on 2H where he was LKW at 1445 and now complaining of L facial droop and slurred speech.    Stroke team at the bedside after patient activation. Patient to CT with team. NIHSS 2, see documentation for details and code stroke times. Patient with left facial droop and dysarthria  on exam. The following imaging was completed:  CT Head and CTA. Patient is not a candidate for IV Thrombolytic due to lung biopsy today, heparin  and lovenox  today. Patient is not a candidate for IR due to no LVO.   Care/Plan: q30 NIHSS/vitals until 1915, then q2 x 12 hours. Systolic BP >120. Swallow screen prior to PO intake.     Bedside handoff with RN [name].    Andrew Hines  Stroke Response RN

## 2024-01-19 NOTE — Procedures (Signed)
 Extubation Procedure Note  Patient Details:   Name: Andrew Hines DOB: 1960-12-22 MRN: [id number]   Airway Documentation:    Vent end date: 01/19/24 Vent end time: 1402   Evaluation  O2 sats: stable throughout Complications: No apparent complications Patient did tolerate procedure well. Bilateral Breath Sounds: Clear, Diminished   Yes  Positive cuff leak noted. Patient placed on ETCO2 Mayer 4 No stridor noted. Patient able to reach 325 using incentive spirometer.  Cy RAMAN Previn Jian 01/19/2024, 2:08 PM

## 2024-01-19 NOTE — Anesthesia Preprocedure Evaluation (Signed)
 Anesthesia Evaluation  Patient identified by MRN, date of birth, ID band Patient awake    Reviewed: Allergy & Precautions, NPO status , Patient's Chart, lab work & pertinent test results  History of Anesthesia Complications Negative for: history of anesthetic complications  Airway Mallampati: III  TM Distance: >3 FB Neck ROM: Full    Dental  (+) Teeth Intact, Dental Advisory Given   Pulmonary neg shortness of breath, neg sleep apnea, neg COPD, neg recent URI, former smoker   breath sounds clear to auscultation       Cardiovascular hypertension, Pt. on medications (-) angina (-) Past MI and (-) CHF  Rhythm:Regular     Neuro/Psych neg Seizures    GI/Hepatic Lab Results      Component                Value               Date                      ALT                      20                  12/09/2023                AST                      49 (H)              12/09/2023                ALKPHOS                  99                  12/09/2023                BILITOT                  0.8                 12/09/2023              Endo/Other  negative endocrine ROS    Renal/GU Renal diseaseLab Results      Component                Value               Date                      NA                       136                 12/09/2023                K                        3.2 (L)             12/09/2023                CO2                      30  12/09/2023                GLUCOSE                  103 (H)             12/09/2023                BUN                      15                  12/09/2023                CREATININE               1.45                12/09/2023                CALCIUM                   9.3                 12/09/2023                GFR                      50.56 (L)           12/09/2023                GFRNONAA                 >60                 09/24/2020                Musculoskeletal negative  musculoskeletal ROS (+)    Abdominal   Peds  Hematology Lab Results      Component                Value               Date                      WBC                      6.8                 01/19/2024                HGB                      16.4                01/19/2024                HCT                      46.6                01/19/2024                MCV                      92.1                01/19/2024  PLT                      356                 01/19/2024              Anesthesia Other Findings   Reproductive/Obstetrics                              Anesthesia Physical Anesthesia Plan  ASA: 3  Anesthesia Plan: General   Post-op Pain Management: Minimal or no pain anticipated   Induction: Intravenous  PONV Risk Score and Plan: 2 and Ondansetron , Dexamethasone , Propofol  infusion and TIVA  Airway Management Planned: Oral ETT  Additional Equipment: None  Intra-op Plan:   Post-operative Plan: Extubation in OR  Informed Consent: I have reviewed the patients History and Physical, chart, labs and discussed the procedure including the risks, benefits and alternatives for the proposed anesthesia with the patient or authorized representative who has indicated his/her understanding and acceptance.     Dental advisory given  Plan Discussed with: CRNA  Anesthesia Plan Comments:          Anesthesia Quick Evaluation

## 2024-01-19 NOTE — Discharge Summary (Incomplete)
 Physician Discharge Summary         Patient ID: Andrew Hines MRN: [id number] DOB/AGE: [birth year] 63 y.o.  Admit date: 01/19/2024 Discharge date: 01/19/2024  Discharge Diagnoses:    Active Hospital Problems   Diagnosis Date Noted   Lung nodules 01/05/2024   Malignant neoplasm of ascending colon (HCC) 07/03/2020    Resolved Hospital Problems   Diagnosis Date Noted Date Resolved   NSTEMI (non-ST elevated myocardial infarction) (HCC) 01/19/2024 01/19/2024   Shock (HCC) 01/19/2024 01/19/2024   ST elevation 01/19/2024 01/19/2024   Bradycardia 01/19/2024 01/19/2024   Elevated troponin 01/19/2024 01/19/2024      Discharge summary    63 year old male patient with a past medical history of stage III colon cancer status post hemicolectomy and chemotherapy back in 2022, had been followed by oncology, and referred to pulmonary after PET scan in November 2025 identified new hypermetabolic mesenteric mass as well as multiple small hypermetabolic lung nodules.  He was brought to [facility] on 12/9 for robotic assisted fiberoptic bronchoscopy, he was intubated by anesthesia for airway protection, target biopsies were obtained from left upper and lower lobe pulmonary nodules.  Intraoperatively the patient developed acute hypotension with systolic blood pressure in the 50s following  a recruitment maneuver.  He then became progressively bradycardic, and developed ST elevation lead II and III  He was treated with atropine , started on norepinephrine , had also received phenylephrine , critical care was emergently summoned to bedside as was advanced heart failure.  A stat bedside TTE was obtained showing LVEF estimated at 45% chest x-ray was negative for pneumothorax. He was transferred subsequently to the intensive care for further cardiac evaluation and stabilization.   Transferred to the intensive care emergently. Placed on Norepinephrine  and brought to the cath lab emergently where a left heart cath  was completed and results    The left ventricular systolic function is normal.   LV end diastolic pressure is mildly elevated.   The left ventricular ejection fraction is 55-65% by visual estimate. Normal coronary anatomy Normal LV function LVEDP 16 mm Hg  Was transferred back to the ICU post cardic cath, sedation was weaned off and he was successfully extubated.   During early evening hours sister was in visiting,  Noted left facial droop. Critical care called urgently to room. This was verified and also noted to have slurred speech and mild left sided pronator drift. He was still oriented x3.  A code stroke was initiated.   Was monitored in the intensive care overnight and deemed ready for dc with the plan as outlined below   Discharge Plan by Active Problems    Lung nodules in patient w/ known history of stage 3 colon cancer.  S/p navigational bronchoscopy 12/9 Plan F/u GORMAN Lites ACNP 12/16 F/u w/ heme/onc as directed  Hypotension w/ elevated Cardiac enzymes and troponin -cardiac cath with normal Ejection Fraction and normal coronary arteries.  -etiology possibly relative hypotension exacerbated by recruitment maneuver for bronchoscopy with transient preload loss. Resolved spontaneously,. Now euvolemic  Plan OK to resume home treatment]  New onset left facial droop and left sided weakness ** Plan  History of hypertension Plan Home on his home regimen     Procedures   Robotic assisted navigational bronchoscopy 12/9 (Byrum) Left heart catheterization 12/9 (Jordan) ECHO 12/9 Cecile) Consults      Discharge Exam: BP (!) 143/70   Pulse 73   Temp 98.5 F (36.9 C) (Oral)   Resp 10   Ht 6' (1.829 m)  Wt 92.5 kg   SpO2 100%   BMI 27.67 kg/m   ****** Labs at discharge   Lab Results  Component Value Date   CREATININE 1.72 (H) 01/19/2024   BUN 19 01/19/2024   NA 136 01/19/2024   K 2.8 (L) 01/19/2024   CL 98 01/19/2024   CO2 21 (L) 01/19/2024   Lab Results   Component Value Date   WBC 9.2 01/19/2024   HGB 14.6 01/19/2024   HCT 41.2 01/19/2024   MCV 92.2 01/19/2024   PLT 328 01/19/2024   Lab Results  Component Value Date   ALT 20 12/09/2023   AST 49 (H) 12/09/2023   ALKPHOS 99 12/09/2023   BILITOT 0.8 12/09/2023   Lab Results  Component Value Date   INR 1.0 06/13/2020    Current radiological studies    DG Abd 1 View Result Date: 01/19/2024 CLINICAL DATA:  Orogastric tube placement. EXAM: ABDOMEN - 1 VIEW COMPARISON:  None Available. FINDINGS: Tip and side port of the enteric tube below the diaphragm in the stomach. Nonobstructive upper abdominal bowel gas pattern. Atelectasis in the lung bases. IMPRESSION: Tip and side port of the enteric tube below the diaphragm in the stomach. Electronically Signed   By: Andrea Gasman M.D.   On: 01/19/2024 15:41   CARDIAC CATHETERIZATION Result Date: 01/19/2024   The left ventricular systolic function is normal.   LV end diastolic pressure is mildly elevated.   The left ventricular ejection fraction is 55-65% by visual estimate. Normal coronary anatomy Normal LV function LVEDP 16 mm Hg Plan: ST changes and elevated troponin due to demand ischemia in setting of marked hypotension.   DG Chest Port 1 View Result Date: 01/19/2024 CLINICAL DATA:  Status post bronchoscopy. EXAM: PORTABLE CHEST 1 VIEW COMPARISON:  01/29/2006 FINDINGS: Endotracheal tube tip is 3.4 cm above the base of the carina. Low lung volumes without edema or dense focal consolidation. Defibrillator pad overlies the left hemithorax. Telemetry leads overlie the chest. IMPRESSION: Endotracheal tube tip is 3.4 cm above the base of the carina. No acute cardiopulmonary findings. Electronically Signed   By: Camellia Candle M.D.   On: 01/19/2024 10:10   DG C-ARM BRONCHOSCOPY Result Date: 01/19/2024 C-ARM BRONCHOSCOPY: Fluoroscopy was utilized by the requesting physician.  No radiographic interpretation.   DG C-Arm 1-60 Min-No Report Result  Date: 01/19/2024 Fluoroscopy was utilized by the requesting physician.  No radiographic interpretation.   DG C-Arm 1-60 Min-No Report Result Date: 01/19/2024 Fluoroscopy was utilized by the requesting physician.  No radiographic interpretation.   DG C-Arm 1-60 Min-No Report Result Date: 01/19/2024 Fluoroscopy was utilized by the requesting physician.  No radiographic interpretation.    Disposition:        Allergies as of 01/19/2024   No Known Allergies   Med Rec must be completed prior to using this [facility]***        Follow-up appointment   *** Discharge Condition:    {condition:18240}  Physician Statement:   The Patient was personally examined, the discharge assessment and plan has been personally reviewed and I agree with ACNP Hoang Pettingill's assessment and plan. *** minutes of time have been dedicated to discharge assessment, planning and discharge instructions.   Signed: Jeralyn FORBES Banner 01/19/2024, 4:23 PM

## 2024-01-19 NOTE — Op Note (Signed)
 Procedure Note  Patient: Andrew Hines  [employer] Cios mobile C-arm was utilized to identify and biopsy left upper and lower lobe nodules.  Needle-in-lesion was confirmed using real-time Cios imaging, and images were uploaded to PACS.    Left upper lobe nodule   Left lower lobe nodule    Lamar Chris, MD, PhD 01/19/2024, 12:34 PM [facility] [phone] or if no answer before 7:00PM call [phone] For any issues after 7:00PM please call eLink [phone]

## 2024-01-19 NOTE — Consult Note (Addendum)
 Advanced Heart Failure Team Consult Note   Primary Physician: Health, [address] Cardiologist:  None  Reason for Consultation: ACS  HPI:    Andrew Hines is seen today for evaluation of ACS at the request of Dr Jeffrie  Andrew Hines is 63 year old with history of lung nodules, HTN, and metastatic colon cancer.    Presented today for scheduled lung biopsy. K 2.8 Underwent broch + biopsy. Case complicated by ST elevation and hypotension. Started on Neo. CCM/Advanced Heart Failure consulted urgently.   Admitted to 2H.     Home Medications Prior to Admission medications   Medication Sig Start Date End Date Taking? Authorizing Provider  amLODipine (NORVASC) 5 MG tablet Take 5 mg by mouth daily.   Yes [provider]  diphenhydrAMINE  HCl, Sleep, 50 MG CAPS Take 100 mg by mouth at bedtime.   Yes [provider]  hydrochlorothiazide (HYDRODIURIL) 25 MG tablet Take 25 mg by mouth daily. 09/11/23  Yes [provider]  Tetrahydrozoline HCl (VISINE OP) Place 1 drop into both eyes daily.   Yes [provider]  capecitabine  (XELODA ) 500 MG tablet Take 3 tabs in morning and 4 tabs in evening for 14 days then off 7 days. Repeat every 21 days. Take within 30 minutes after meals. Patient not taking: Reported on 01/18/2024 09/24/20   Lanny Callander, MD  Cholecalciferol (VITAMIN D) 50 MCG (2000 UT) tablet Take 2,000 Units by mouth daily. Patient not taking: Reported on 01/18/2024    [provider]  ondansetron  (ZOFRAN ) 8 MG tablet Take 1 tablet (8 mg total) by mouth 2 (two) times daily as needed for refractory nausea / vomiting. Start on day 3 after chemotherapy. Patient not taking: Reported on 01/18/2024 07/23/20   Lanny Callander, MD  potassium chloride  SA (KLOR-CON  M) 20 MEQ tablet Take 2 tablets (40 mEq total) by mouth daily for 3 days, THEN 1 tablet (20 mEq total) daily for 3 days. Patient not taking: Reported on 01/18/2024 11/27/23 01/05/24  Craig Alan SAUNDERS, PA-C   prochlorperazine  (COMPAZINE ) 10 MG tablet Take 1 tablet (10 mg total) by mouth every 6 (six) hours as needed (Nausea or vomiting). Patient not taking: Reported on 01/18/2024 07/04/20   Lanny Callander, MD  sildenafil (VIAGRA) 100 MG tablet Take 100 mg by mouth daily as needed for erectile dysfunction.    [provider]    Past Medical History: Past Medical History:  Diagnosis Date   Colon cancer (HCC) 04/17/2020   HBP (high blood pressure) 2025   Pneumonia    2020    Skin cancer, basal cell     Past Surgical History: Past Surgical History:  Procedure Laterality Date   APPENDECTOMY     COLONOSCOPY  2025   LAPAROSCOPIC RIGHT HEMI COLECTOMY N/A 06/13/2020   Procedure: LAPAROSCOPIC RIGHT HEMI COLECTOMY AND PARTIAL OMENTECTOMY WITH LYSIS OF ADHESIONS;  Surgeon: Teresa Lonni HERO, MD;  Location: [facility];  Service: General;  Laterality: N/A;    Family History: Family History  Problem Relation Age of Onset   Colon polyps Mother    Hypertension Mother    Lung cancer Father        d. lung cancer   Prostate cancer Father        metastatic prostate   Alcohol abuse Father    Hypertension Sister    Hypertension Brother    Cancer Paternal Uncle        Prostate or colon cancer   Cancer Paternal Uncle  Prostate or colon cancer   Cancer Paternal Uncle        Prostate or colon cancer   Cancer Paternal Uncle        Prostate or colon cancer   Cancer Paternal Uncle        Prostate or colon cancer   Cancer Paternal Uncle        Prostate or colon cancer   Cancer Cousin        paternal, lung    Social History: Social History   Socioeconomic History   Marital status: Significant Other    Spouse name: Not on file   Number of children: 3   Years of education: Not on file   Highest education level: Not on file  Occupational History   Not on file  Tobacco Use   Smoking status: Former    Current packs/day: 0.00    Average packs/day: 1 pack/day for 5.0 years (5.0 ttl  pk-yrs)    Types: Cigarettes    Start date: 6    Quit date: 11    Years since quitting: 32.9   Smokeless tobacco: Never  Vaping Use   Vaping status: Never Used  Substance and Sexual Activity   Alcohol use: Not Currently    Alcohol/week: 14.0 standard drinks of alcohol    Types: 14 Shots of liquor per week    Comment: gin daily x25 years, cut down to 2 drinks a day   Drug use: Never   Sexual activity: Yes  Other Topics Concern   Not on file  Social History Narrative   Lives alone, has significant other and sibling support. 3 adult children (1 son has Crohn's). Daughter's mother had breast cancer (pt's daughter reportedly negative genetic testing). 6 paternal uncles had either prostate or colon cancers, details kept private in the family. 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     Social Drivers of Corporate Investment Banker Strain: Not on file  Food Insecurity: No Food Insecurity (12/17/2023)   Hunger Vital Sign    Worried About Running Out of Food in the Last Year: Never true    Ran Out of Food in the Last Year: Never true  Transportation Needs: No Transportation Needs (12/17/2023)   PRAPARE - Administrator, Civil Service (Medical): No    Lack of Transportation (Non-Medical): No  Physical Activity: Not on file  Stress: Not on file  Social Connections: Unknown (06/21/2021)   Received from [facility]   Social Network    Social Network: Not on file    Allergies:  No Known Allergies  Objective:    Vital Signs:   Temp:  [98.5 F (36.9 C)] 98.5 F (36.9 C) (12/09 0606) Pulse Rate:  [79-101] 101 (12/09 1030) Resp:  [16-18] 17 (12/09 1030) BP: (123-139)/(66-88) 139/66 (12/09 1030) SpO2:  [96 %-99 %] 99 % (12/09 1030) FiO2 (%):  [60 %] 60 % (12/09 1030) Weight:  [92.1 kg-92.5 kg] 92.5 kg (12/09 0606)    Weight change: Filed Weights   01/18/24 1628 01/19/24 0606  Weight: 92.1 kg 92.5 kg     Intake/Output:   Intake/Output Summary (Last 24 hours) at 01/19/2024 1040 Last data filed at 01/19/2024 1010 Gross per 24 hour  Intake 800 ml  Output --  Net 800 ml      Physical Exam   General:   Intubated  Cor: Regular rate & rhythm.  Lungs: clear Extremities: no  edema  Telemetry   SR  EKG    SR with ST elevation lead III.   Labs   Basic Metabolic Panel: Recent Labs  Lab 01/19/24 0636  NA 136  K 2.8*  CL 98  CO2 21*  GLUCOSE 122*  BUN 19  CREATININE 1.72*  CALCIUM  8.8*    Liver Function Tests: No results for input(s): AST, ALT, ALKPHOS, BILITOT, PROT, ALBUMIN in the last 168 hours. No results for input(s): LIPASE, AMYLASE in the last 168 hours. No results for input(s): AMMONIA in the last 168 hours.  CBC: Recent Labs  Lab 01/19/24 0636  WBC 6.8  HGB 16.4  HCT 46.6  MCV 92.1  PLT 356    Cardiac Enzymes: Recent Labs  Lab 01/19/24 0940  CKTOTAL 60  CKMB 0.8    BNP: BNP (last 3 results) No results for input(s): BNP in the last 8760 hours.  ProBNP (last 3 results) No results for input(s): PROBNP in the last 8760 hours.   CBG: No results for input(s): GLUCAP in the last 168 hours.  Coagulation Studies: No results for input(s): LABPROT, INR in the last 72 hours.   Imaging   DG Chest Port 1 View Result Date: 01/19/2024 CLINICAL DATA:  Status post bronchoscopy. EXAM: PORTABLE CHEST 1 VIEW COMPARISON:  01/29/2006 FINDINGS: Endotracheal tube tip is 3.4 cm above the base of the carina. Low lung volumes without edema or dense focal consolidation. Defibrillator pad overlies the left hemithorax. Telemetry leads overlie the chest. IMPRESSION: Endotracheal tube tip is 3.4 cm above the base of the carina. No acute cardiopulmonary findings. Electronically Signed   By: Camellia Candle M.D.   On: 01/19/2024 10:10   DG C-ARM BRONCHOSCOPY Result Date: 01/19/2024 C-ARM BRONCHOSCOPY: Fluoroscopy was utilized by the  requesting physician.  No radiographic interpretation.   DG C-Arm 1-60 Min-No Report Result Date: 01/19/2024 Fluoroscopy was utilized by the requesting physician.  No radiographic interpretation.   DG C-Arm 1-60 Min-No Report Result Date: 01/19/2024 Fluoroscopy was utilized by the requesting physician.  No radiographic interpretation.   DG C-Arm 1-60 Min-No Report Result Date: 01/19/2024 Fluoroscopy was utilized by the requesting physician.  No radiographic interpretation.     Medications:     Current Medications:  docusate  100 mg Per Tube BID   famotidine   20 mg Per Tube BID   ipratropium-albuterol   3 mL Nebulization Q6H   mouth rinse  15 mL Mouth Rinse Q2H   polyethylene glycol  17 g Per Tube Daily    Infusions:  sodium chloride      fentaNYL      fentaNYL  infusion INTRAVENOUS 50 mcg/hr (01/19/24 1034)   phenylephrine  (NEO-SYNEPHRINE) Adult infusion     potassium chloride  100 mL/hr at 01/19/24 1015   propofol  (DIPRIVAN ) infusion 50 mcg/kg/min (01/19/24 1015)      Patient Profile   Andrew Hines is 63 year old with history of lung nodules, HTN, and metastatic colon cancer.    Transferred from Endo Suite - Lung bx complicated by hypotension + ST changes.   Assessment/Plan  # ACS EKG with ST elevation during broch. EKG repeated ST elevated lead III.  Check HS Trop  Plan for LHC now.  Dr Zenaida discussed with Dr Skains/Dr Jorda/Dr Gretta.   #Lung Nodule  12/9 S/P Bronch + biopsies.   # Hypokalemia  K 2.8. Supp K.   # HTN  Anticipate adding back home meds once acute event resolved.   Length of Stay: 0  Greig Mosses, NP  01/19/2024, 10:40 AM  Advanced Heart Failure Team Pager [phone] (M-F; 7a - 5p)  Please contact [facility] for night-coverage after hours (4p -7a ) and weekends on [email or web address]

## 2024-01-19 NOTE — Anesthesia Procedure Notes (Signed)
 Procedure Name: Intubation Date/Time: 01/19/2024 7:53 AM  Performed by: Hedy Jarred, CRNAPre-anesthesia Checklist: Patient identified, Emergency Drugs available, Suction available and Patient being monitored Patient Re-evaluated:Patient Re-evaluated prior to induction Oxygen Delivery Method: Circle System Utilized Preoxygenation: Pre-oxygenation with 100% oxygen Induction Type: IV induction Ventilation: Mask ventilation without difficulty Laryngoscope Size: 4 and Mac Grade View: Grade II Tube type: Oral Tube size: 8.5 mm Number of attempts: 1 Airway Equipment and Method: Stylet and Oral airway Placement Confirmation: ETT inserted through vocal cords under direct vision, positive ETCO2 and breath sounds checked- equal and bilateral Secured at: 23 cm Tube secured with: Tape Dental Injury: Teeth and Oropharynx as per pre-operative assessment

## 2024-01-19 NOTE — Progress Notes (Signed)
 New facial droop- left lower face. Symmetric eyelid movement, EOMI. No dysarthria. Symmetric grip strength, elbow flexion & extension.  4/5 LLE leg raise.   Code stroke called. LKW was nurse's bedside swallow around 2:30pm. D/w Dr Shelah and Neuro- with lung biopsies today, TNK would be higher risk due to noncompressible site. R radial cath site could have pressure applied, so less worrisome acutely. Without severe neuro deficits, bleeding risk seems too high for TNK to be reasonable. Both Drs. Arora & Byrum agree.   I updated his daughter Morna and her husband who's a cardiac intensivist. All questions answered. They agree based on his symptoms that TNK would be very high risk and not advised.  Additional cc time: 22 min.  Leita SHAUNNA Gaskins, DO 01/19/24 6:48 PM [facility]  For contact information, see Amion. If no response to pager, please call PCCM consult pager. After hours, 7PM- 7AM, please call Elink.

## 2024-01-19 NOTE — Progress Notes (Signed)
   Heart failure team will sign off as of 01/19/24   Follow up as an outpatient in the HF clinic ? No  If no please follow up with [facility]  after discharge. We have reached out to get post hospital follow up.   Mikella Linsley NP-C  4:44 PM

## 2024-01-19 NOTE — Transfer of Care (Signed)
 Immediate Anesthesia Transfer of Care Note  Patient: Andrew Hines  Procedure(s) Performed: VIDEO BRONCHOSCOPY WITH ENDOBRONCHIAL NAVIGATION (Bilateral)  Patient Location: ICU  Anesthesia Type:General  Level of Consciousness: Patient remains intubated per anesthesia plan  Airway & Oxygen Therapy: Patient remains intubated per anesthesia plan and Patient placed on Ventilator (see vital sign flow sheet for setting)  Post-op Assessment: Report given to RN and Post -op Vital signs reviewed and stable  Post vital signs: Reviewed and stable  Last Vitals:  Vitals Value Taken Time  BP 139/66 01/19/24 10:30  Temp    Pulse 108 01/19/24 10:46  Resp 21 01/19/24 10:46  SpO2 97 % 01/19/24 10:46  Vitals shown include unfiled device data.  Last Pain:  Vitals:   01/19/24 0636  TempSrc:   PainSc: 0-No pain         Complications: No notable events documented.

## 2024-01-19 NOTE — Anesthesia Procedure Notes (Addendum)
 Arterial Line Insertion Start/End12/10/2023 9:20 AM, 01/19/2024 9:24 AM Performed by: Leopoldo Bruckner, MD, anesthesiologist  Patient location: OR. Preanesthetic checklist: patient identified, IV checked, site marked, risks and benefits discussed, surgical consent, monitors and equipment checked, pre-op evaluation, timeout performed and anesthesia consent Patient sedated Left, radial was placed Catheter size: 20 G Hand hygiene performed  and Seldinger technique used Allen's test indicative of satisfactory collateral circulation Attempts: 1 Procedure performed using ultrasound to evaluate access site. Ultrasound Notes:relevant anatomy identified, ultrasound used to visualize needle entry and vessel patent under ultrasound. Following insertion, dressing applied and Biopatch. Patient tolerated the procedure well with no immediate complications.

## 2024-01-19 NOTE — Op Note (Signed)
 Video Bronchoscopy with Robotic Assisted Bronchoscopic Navigation   Date of Operation: 01/19/2024   Pre-op Diagnosis: Bilateral pulmonary nodules, history of colon cancer  Post-op Diagnosis: Same  Surgeon: Lamar Chris  Assistants: None  Anesthesia: General endotracheal anesthesia  Operation: Flexible video fiberoptic bronchoscopy with robotic assistance and biopsies.  Estimated Blood Loss: Minimal  Complications: Intraoperative hypotension while under general anesthesia associated with ECG changes  Indications and History: Andrew Hines is a 63 y.o. male with history of colon cancer (2022), hypertension.  His surveillance imaging showed evidence for possible recurrence with bilateral pulmonary nodules.  Recommendation made to achieve a tissue diagnosis via robotic assisted navigational bronchoscopy.  The risks, benefits, complications, treatment options and expected outcomes were discussed with the patient.  The possibilities of pneumothorax, pneumonia, reaction to medication, pulmonary aspiration, perforation of a viscus, bleeding, failure to diagnose a condition and creating a complication requiring transfusion or operation were discussed with the patient who freely signed the consent.    Description of Procedure: The patient was seen in the Preoperative Area, was examined and was deemed appropriate to proceed.  The patient was taken to [facility] Endoscopy room 3, identified as Andrew Hines and the procedure verified as Flexible Video Fiberoptic Bronchoscopy.  A Time Out was held and the above information confirmed.   Prior to the date of the procedure a high-resolution CT scan of the chest was performed. Utilizing ION software program a virtual tracheobronchial tree was generated to allow the creation of distinct navigation pathways to the patient's parenchymal abnormalities. After being taken to the operating room general anesthesia was initiated and the patient  was orally intubated. The  video fiberoptic bronchoscope was introduced via the endotracheal tube and a general inspection was performed which showed normal right and left lung anatomy. Aspiration of the bilateral mainstems was completed to remove any remaining secretions. Robotic catheter inserted into patient's endotracheal tube.   Target #1 left lower lobe pulmonary nodule: The distinct navigation pathways prepared prior to this procedure were then utilized to navigate to patient's lesion identified on CT scan. The robotic catheter was secured into place and the vision probe was withdrawn.  Lesion location was approximated using fluoroscopy.  Local registration and targeting was performed using [employer] Cios mobile C-arm three-dimensional imaging. Under fluoroscopic guidance transbronchial needle biopsies were performed to be sent for cytology and pathology.  Needle-in-lesion was confirmed using Cios mobile C-arm.    Target #2 left upper lobe pulmonary nodule: The distinct navigation pathways prepared prior to this procedure were then utilized to navigate to patient's lesion identified on CT scan. The robotic catheter was secured into place and the vision probe was withdrawn.  Lesion location was approximated using fluoroscopy.  Local registration and targeting was performed using [employer] Cios mobile C-arm three-dimensional imaging. Under fluoroscopic guidance transbronchial needle biopsies and transbronchial forceps biopsies were performed to be sent for cytology and pathology.  Needle-in-lesion was confirmed using Cios mobile C-arm.     Intraoperatively the patient developed sustained hypotension, SBP 50s.  This followed a recruitment maneuver and question whether elevated intrathoracic pressure may have contributed to decreased venous return and hypotension.  He developed subsequent bradycardia, SpO2 desaturations and evidence for ST elevation in single lead on monitor while supported.  Received  phenylephrine , then atropine /norepinephrine  with improvement in cardiopulmonary status.  Stat TTE and stat ECG performed at bedside.  No evidence of pericardial effusion, LVEF estimated 45%.  ECG with subtle ST changes concerning for ischemia.  Bedside  chest x-ray without any evidence for pneumothorax.  Unclear whether his ECG changes indicate cause or effect of his decompensation.  Plan will be to remain intubated for now, follow serial ECG, troponin for any evidence of evolving STEMI.  If present then Cath Lab would be appropriate.  If he stabilizes without any evidence of active STEMI then he can likely be waked up and extubated.  Discussed plans with Dr. Jeffrie, Dr. Leonora, Dr. Gretta.   Samples Target #1: 1. Transbronchial Wang needle biopsies from left lower lobe nodule  Samples Target #2: 1. Transbronchial Wang needle biopsies from left upper lobe nodule 2. Transbronchial forceps biopsies from left upper lobe nodule  Plans:  As above patient will remain intubated and sedated for now with plan to follow ECG and determine next appropriate steps in his care.  Hopefully we can push quickly for extubation in the CVICU if no plans for Cath Lab.   Lamar Chris, MD, PhD 01/19/2024, 10:25 AM [facility] [phone] or if no answer before 7:00PM call [phone] For any issues after 7:00PM please call eLink [phone]

## 2024-01-19 NOTE — Telephone Encounter (Signed)
 Lvm  regarding the appt change.

## 2024-01-19 NOTE — Consult Note (Signed)
 Cardiology Consultation   Patient ID: DEAUNDRE ALLSTON MRN: [id number]; DOB: [birth year]  Admit date: 01/19/2024 Date of Consult: 01/19/2024  PCP:  [facility]   [facility] Providers Cardiologist:  None        Patient Profile: Andrew Hines is a 63 y.o. male with a hx of stage IIIb colon cancer who is being seen 01/19/2024 for the evaluation of ST segment elevation, hypotension during bronchoscopy at the request of Dr. Shelah.  History of Present Illness: Mr. Andrew Hines is a 63 year old male with stage IIIb colon cancer and pulmonary nodules concerning for metastasis with prior history of hypertension but no other cardiac diagnoses who during bronchoscopy/biopsies and endoscopy developed severe hypotension requiring epinephrine  by anesthesiology and stat echocardiogram after telemetry noted marked ST segment elevation during this period of hypoperfusion.  He also subsequently had frequent ectopic beats.  Did not require CPR or defibrillation.  I accompanied the echo technician to his bedside.  At the time of arrival, he remained intubated, blood pressures had improved, bedside stat echocardiogram demonstrated difficult windows however EF appeared to be in the 45% range preliminarily with possible lateral wall hypokinesis.  Images were challenging.  A twelve-lead echocardiogram was obtained and demonstrated 1 to 2 mm of ST elevation in lead III, subtle ST elevation in aVF, no ST elevation in lead II.  There appeared to be reciprocal ST segment depression in the lateral leads I and aVL as well as lateral precordial leads.  His potassium was noteworthy at 2.8.  Creatinine was 1.72.   Past Medical History:  Diagnosis Date   Colon cancer (HCC) 04/17/2020   HBP (high blood pressure) 2025   Pneumonia    2020    Skin cancer, basal cell     Past Surgical History:  Procedure Laterality Date   APPENDECTOMY     COLONOSCOPY  2025   LAPAROSCOPIC RIGHT HEMI COLECTOMY N/A 06/13/2020    Procedure: LAPAROSCOPIC RIGHT HEMI COLECTOMY AND PARTIAL OMENTECTOMY WITH LYSIS OF ADHESIONS;  Surgeon: Teresa Lonni HERO, MD;  Location: [facility] ORS;  Service: General;  Laterality: N/A;     Home Medications:  Prior to Admission medications   Medication Sig Start Date End Date Taking? Authorizing Provider  amLODipine (NORVASC) 5 MG tablet Take 5 mg by mouth daily.   Yes [provider]  diphenhydrAMINE  HCl, Sleep, 50 MG CAPS Take 100 mg by mouth at bedtime.   Yes [provider]  hydrochlorothiazide (HYDRODIURIL) 25 MG tablet Take 25 mg by mouth daily. 09/11/23  Yes [provider]  Tetrahydrozoline HCl (VISINE OP) Place 1 drop into both eyes daily.   Yes [provider]  capecitabine  (XELODA ) 500 MG tablet Take 3 tabs in morning and 4 tabs in evening for 14 days then off 7 days. Repeat every 21 days. Take within 30 minutes after meals. Patient not taking: Reported on 01/18/2024 09/24/20   Lanny Callander, MD  Cholecalciferol (VITAMIN D) 50 MCG (2000 UT) tablet Take 2,000 Units by mouth daily. Patient not taking: Reported on 01/18/2024    [provider]  ondansetron  (ZOFRAN ) 8 MG tablet Take 1 tablet (8 mg total) by mouth 2 (two) times daily as needed for refractory nausea / vomiting. Start on day 3 after chemotherapy. Patient not taking: Reported on 01/18/2024 07/23/20   Lanny Callander, MD  potassium chloride  SA (KLOR-CON  M) 20 MEQ tablet Take 2 tablets (40 mEq total) by mouth daily for 3 days, THEN 1 tablet (20 mEq total) daily for  3 days. Patient not taking: Reported on 01/18/2024 11/27/23 01/05/24  Craig Alan SAUNDERS, PA-C  prochlorperazine  (COMPAZINE ) 10 MG tablet Take 1 tablet (10 mg total) by mouth every 6 (six) hours as needed (Nausea or vomiting). Patient not taking: Reported on 01/18/2024 07/04/20   Lanny Callander, MD  sildenafil (VIAGRA) 100 MG tablet Take 100 mg by mouth daily as needed for erectile dysfunction.    [provider]    Scheduled Meds:   docusate  100 mg Per Tube BID   enoxaparin  (LOVENOX ) injection  40 mg Subcutaneous Q24H   famotidine   20 mg Per Tube BID   insulin  aspart  0-9 Units Subcutaneous Q4H   ipratropium-albuterol   3 mL Nebulization Q6H   polyethylene glycol  17 g Per Tube Daily   potassium chloride   40 mEq Per Tube Q6H   sodium chloride  flush  3 mL Intravenous Q12H   Continuous Infusions:  sodium chloride  Stopped (01/19/24 1043)   sodium chloride      norepinephrine  (LEVOPHED ) Adult infusion 1 mcg/min (01/19/24 1330)   PRN Meds: sodium chloride , acetaminophen , ipratropium-albuterol , ondansetron  (ZOFRAN ) IV, mouth rinse, sodium chloride  flush  Allergies:   No Known Allergies  Social History:   Social History   Socioeconomic History   Marital status: Significant Other    Spouse name: Not on file   Number of children: 3   Years of education: Not on file   Highest education level: Not on file  Occupational History   Not on file  Tobacco Use   Smoking status: Former    Current packs/day: 0.00    Average packs/day: 1 pack/day for 5.0 years (5.0 ttl pk-yrs)    Types: Cigarettes    Start date: 70    Quit date: 40    Years since quitting: 32.9   Smokeless tobacco: Never  Vaping Use   Vaping status: Never Used  Substance and Sexual Activity   Alcohol use: Not Currently    Alcohol/week: 14.0 standard drinks of alcohol    Types: 14 Shots of liquor per week    Comment: gin daily x25 years, cut down to 2 drinks a day   Drug use: Never   Sexual activity: Yes  Other Topics Concern   Not on file  Social History Narrative   Lives alone, has significant other and sibling support. 3 adult children (1 son has Crohn's). Daughter's mother had breast cancer (pt's daughter reportedly negative genetic testing). 6 paternal uncles had either prostate or colon cancers, details kept private in the family. 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     Social Drivers of Manufacturing Engineer Strain: Not on file  Food Insecurity: No Food Insecurity (12/17/2023)   Hunger Vital Sign    Worried About Running Out of Food in the Last Year: Never true    Ran Out of Food in the Last Year: Never true  Transportation Needs: No Transportation Needs (12/17/2023)   PRAPARE - Administrator, Civil Service (Medical): No    Lack of Transportation (Non-Medical): No  Physical Activity: Not on file  Stress: Not on file  Social Connections: Unknown (06/21/2021)   Received from [facility]   Social Network    Social Network: Not on file  Intimate Partner Violence: Not At Risk (12/17/2023)   Humiliation, Afraid, Rape, and Kick questionnaire    Fear of Current or Ex-Partner: No    Emotionally Abused: No    Physically  Abused: No    Sexually Abused: No    Family History:    Family History  Problem Relation Age of Onset   Colon polyps Mother    Hypertension Mother    Lung cancer Father        d. lung cancer   Prostate cancer Father        metastatic prostate   Alcohol abuse Father    Hypertension Sister    Hypertension Brother    Cancer Paternal Uncle        Prostate or colon cancer   Cancer Paternal Uncle        Prostate or colon cancer   Cancer Paternal Uncle        Prostate or colon cancer   Cancer Paternal Uncle        Prostate or colon cancer   Cancer Paternal Uncle        Prostate or colon cancer   Cancer Paternal Uncle        Prostate or colon cancer   Cancer Cousin        paternal, lung     ROS:  Unable   Physical Exam/Data: Vitals:   01/19/24 1400 01/19/24 1404 01/19/24 1415 01/19/24 1430  BP: 117/82 (!) 143/70    Pulse: 91 91 78 87  Resp: (!) 6 14 10 12   Temp:      TempSrc:      SpO2: 100% 99% 99% 94%  Weight:      Height:        Intake/Output Summary (Last 24 hours) at 01/19/2024 1447 Last data filed at 01/19/2024 1330 Gross per 24 hour  Intake 1030.83 ml  Output 150 ml  Net 880.83 ml      01/19/2024    6:06 AM  01/18/2024    4:28 PM 01/05/2024    8:46 AM  Last 3 Weights  Weight (lbs) 204 lb 203 lb 202 lb  Weight (kg) 92.534 kg 92.08 kg 91.627 kg     Body mass index is 27.67 kg/m.   Currently on bronchoscopy table, intubated, arterial line access demonstrates systolic blood pressures in the 120 range.  Sedate.  Regular rate and rhythm, lungs clear  EKG:  The EKG was personally reviewed and demonstrates: As described above Telemetry:  Telemetry was personally reviewed and demonstrates: As described above  Relevant CV Studies: As described above  Laboratory Data:  Chemistry Recent Labs  Lab 01/19/24 0636  NA 136  K 2.8*  CL 98  CO2 21*  GLUCOSE 122*  BUN 19  CREATININE 1.72*  CALCIUM  8.8*  GFRNONAA 44*  ANIONGAP 17*    No results for input(s): PROT, ALBUMIN, AST, ALT, ALKPHOS, BILITOT in the last 168 hours. Lipids No results for input(s): CHOL, TRIG, HDL, LABVLDL, LDLCALC, CHOLHDL in the last 168 hours.  Hematology Recent Labs  Lab 01/19/24 0636  WBC 6.8  RBC 5.06  HGB 16.4  HCT 46.6  MCV 92.1  MCH 32.4  MCHC 35.2  RDW 12.4  PLT 356   Thyroid  No results for input(s): TSH, FREET4 in the last 168 hours.  BNPNo results for input(s): BNP, PROBNP in the last 168 hours.  DDimer No results for input(s): DDIMER in the last 168 hours.  Radiology/Studies:  CARDIAC CATHETERIZATION Result Date: 01/19/2024   The left ventricular systolic function is normal.   LV end diastolic pressure is mildly elevated.   The left ventricular ejection fraction is 55-65% by visual estimate. Normal coronary anatomy Normal LV function  LVEDP 16 mm Hg Plan: ST changes and elevated troponin due to demand ischemia in setting of marked hypotension.   CT SUPER D CHEST WO CONTRAST Result Date: 01/19/2024 CLINICAL DATA:  Pulmonary nodule.  Pre-procedure planning. EXAM: CT CHEST WITHOUT CONTRAST TECHNIQUE: Multidetector CT imaging of the chest was performed using thin slice  collimation for electromagnetic bronchoscopy planning purposes, without intravenous contrast. RADIATION DOSE REDUCTION: This exam was performed according to the departmental dose-optimization program which includes automated exposure control, adjustment of the mA and/or kV according to patient size and/or use of iterative reconstruction technique. COMPARISON:  PET-CT 12/29/2023 FINDINGS: Cardiovascular: The heart size is normal. No substantial pericardial effusion. Coronary artery calcification is evident. Mild atherosclerotic calcification is noted in the wall of the thoracic aorta. Mediastinum/Nodes: No mediastinal lymphadenopathy. No evidence for gross hilar lymphadenopathy although assessment is limited by the lack of intravenous contrast on the current study. The esophagus has normal imaging features. There is no axillary lymphadenopathy. Lungs/Pleura: As before, multiple bilateral pulmonary nodules are evident. 1 of the more dominant pulmonary nodules is in the left lower lobe measuring 12 mm on 95/302, similar to prior. 12 mm medial right lower lobe nodule on 90/302 is also similar to prior (11 mm previously). Subpleural index right middle lobe pulmonary nodule measuring 9 mm on 77/302 was 8 mm previously. No focal airspace consolidation. No pleural effusion. Upper Abdomen: 11 mm hypodensity in the posterior right hepatic dome is stable since prior, likely benign. Surgical changes noted in the hepatic flexure. Musculoskeletal: No worrisome lytic or sclerotic osseous abnormality. IMPRESSION: 1. Multiple bilateral pulmonary nodules, similar to prior. 2.  Aortic Atherosclerosis (ICD10-I70.0). Electronically Signed   By: Camellia Candle M.D.   On: 01/19/2024 10:16   DG Chest Port 1 View Result Date: 01/19/2024 CLINICAL DATA:  Status post bronchoscopy. EXAM: PORTABLE CHEST 1 VIEW COMPARISON:  01/29/2006 FINDINGS: Endotracheal tube tip is 3.4 cm above the base of the carina. Low lung volumes without edema or dense  focal consolidation. Defibrillator pad overlies the left hemithorax. Telemetry leads overlie the chest. IMPRESSION: Endotracheal tube tip is 3.4 cm above the base of the carina. No acute cardiopulmonary findings. Electronically Signed   By: Camellia Candle M.D.   On: 01/19/2024 10:10   DG C-ARM BRONCHOSCOPY Result Date: 01/19/2024 C-ARM BRONCHOSCOPY: Fluoroscopy was utilized by the requesting physician.  No radiographic interpretation.   DG C-Arm 1-60 Min-No Report Result Date: 01/19/2024 Fluoroscopy was utilized by the requesting physician.  No radiographic interpretation.   DG C-Arm 1-60 Min-No Report Result Date: 01/19/2024 Fluoroscopy was utilized by the requesting physician.  No radiographic interpretation.   DG C-Arm 1-60 Min-No Report Result Date: 01/19/2024 Fluoroscopy was utilized by the requesting physician.  No radiographic interpretation.     Assessment and Plan:  63 year old male with possible metastatic colon cancer with profound hypotension intraoperatively during bronchoscopy, biopsies, with transient hypoperfusion, shock, possible mildly reduced ejection fraction 45% with EKG abnormality showing ST segment elevation in lead III, subtle elevation in aVF and ischemic downsloping ST segments in lateral leads.  I discussed with the anesthesia team as well as Dr. Shelah of the pulmonary team and discussed as well with interventional cardiology, Dr. Jordan as well as Dr. Zenaida of advanced heart failure and Dr. Gretta of critical care.  Since EKG did not have multiple contiguous leads of ST elevation, thought was to bring him to 2H, cardiac critical care unit, remained intubated, repeat EKG and 30 minutes, obtain troponin, and if repeat EKG appeared  more worrisome or if clinically deterioration occurred, he should be taken for diagnostic angiography.  If he continued to stabilize and improve, nonemergent angiography would be reasonable as these changes may have been precipitated by  profound hypotension during procedure.  Nonetheless close monitoring is necessary.  Dr. Shelah updated family.   For questions or updates, please contact [facility] Please consult [email or web address] for contact info under     CRITICAL CARE TIME Performed by: Oneil Parchment, MD   Total critical care time: 40 minutes  Critical care time was exclusive of separately billable procedures and treating other patients.  Critical care was necessary to treat or prevent imminent or life-threatening deterioration.  Critical care was time spent personally by me on the following activities: development of treatment plan with patient and/or surrogate as well as nursing, discussions with consultants, evaluation of patient's response to treatment, examination of patient, obtaining history from patient or surrogate, ordering and performing treatments and interventions, ordering and review of laboratory studies, ordering and review of radiographic studies, pulse oximetry and re-evaluation of patient's condition. Signed, Oneil Parchment, MD  01/19/2024 2:47 PM

## 2024-01-19 NOTE — Progress Notes (Signed)
 Called emergent to room  Pt up in chair  Family had just arrived  Noted new left facial droop.  Had very minimal pronator drift on Left  Sp slightly slurred  Last seen normal 1430 (per nursing staff)  Plan Code stroke called  Stat non-contrasted CT head

## 2024-01-20 ENCOUNTER — Inpatient Hospital Stay (HOSPITAL_COMMUNITY)

## 2024-01-20 ENCOUNTER — Telehealth: Payer: Self-pay

## 2024-01-20 DIAGNOSIS — N1831 Chronic kidney disease, stage 3a: Secondary | ICD-10-CM

## 2024-01-20 DIAGNOSIS — R001 Bradycardia, unspecified: Secondary | ICD-10-CM

## 2024-01-20 DIAGNOSIS — R579 Shock, unspecified: Secondary | ICD-10-CM

## 2024-01-20 DIAGNOSIS — I6389 Other cerebral infarction: Secondary | ICD-10-CM | POA: Diagnosis not present

## 2024-01-20 LAB — POCT I-STAT 7, (LYTES, BLD GAS, ICA,H+H)
Acid-Base Excess: 0 mmol/L (ref 0.0–2.0)
Bicarbonate: 24.8 mmol/L (ref 20.0–28.0)
Calcium, Ion: 1.06 mmol/L — ABNORMAL LOW (ref 1.15–1.40)
HCT: 42 % (ref 39.0–52.0)
Hemoglobin: 14.3 g/dL (ref 13.0–17.0)
O2 Saturation: 100 %
Potassium: 2.6 mmol/L — CL (ref 3.5–5.1)
Sodium: 135 mmol/L (ref 135–145)
TCO2: 26 mmol/L (ref 22–32)
pCO2 arterial: 38.1 mmHg (ref 32–48)
pH, Arterial: 7.422 (ref 7.35–7.45)
pO2, Arterial: 451 mmHg — ABNORMAL HIGH (ref 83–108)

## 2024-01-20 LAB — BASIC METABOLIC PANEL WITH GFR
Anion gap: 10 (ref 5–15)
BUN: 14 mg/dL (ref 8–23)
CO2: 27 mmol/L (ref 22–32)
Calcium: 8.7 mg/dL — ABNORMAL LOW (ref 8.9–10.3)
Chloride: 100 mmol/L (ref 98–111)
Creatinine, Ser: 1.4 mg/dL — ABNORMAL HIGH (ref 0.61–1.24)
GFR, Estimated: 56 mL/min — ABNORMAL LOW (ref >60)
Glucose, Bld: 152 mg/dL — ABNORMAL HIGH (ref 70–99)
Potassium: 3.5 mmol/L (ref 3.5–5.1)
Sodium: 137 mmol/L (ref 135–145)

## 2024-01-20 LAB — CBC
HCT: 42.4 % (ref 39.0–52.0)
Hemoglobin: 15.2 g/dL (ref 13.0–17.0)
MCH: 33 pg (ref 26.0–34.0)
MCHC: 35.8 g/dL (ref 30.0–36.0)
MCV: 92.2 fL (ref 80.0–100.0)
Platelets: 376 K/uL (ref 150–400)
RBC: 4.6 MIL/uL (ref 4.22–5.81)
RDW: 12.5 % (ref 11.5–15.5)
WBC: 10.4 K/uL (ref 4.0–10.5)
nRBC: 0 % (ref 0.0–0.2)

## 2024-01-20 LAB — LIPID PANEL
Cholesterol: 135 mg/dL (ref 0–200)
HDL: 55 mg/dL (ref >40)
LDL Cholesterol: 60 mg/dL (ref 0–99)
Total CHOL/HDL Ratio: 2.5 ratio
Triglycerides: 98 mg/dL (ref <150)
VLDL: 20 mg/dL (ref 0–40)

## 2024-01-20 LAB — ECHOCARDIOGRAM COMPLETE
AR max vel: 2.87 cm2
AV Peak grad: 4.9 mmHg
Ao pk vel: 1.11 m/s
Area-P 1/2: 4.71 cm2
Height: 72 in
S' Lateral: 2.9 cm
Weight: 3040.58 [oz_av]

## 2024-01-20 LAB — GLUCOSE, CAPILLARY
Glucose-Capillary: 144 mg/dL — ABNORMAL HIGH (ref 70–99)
Glucose-Capillary: 122 mg/dL — ABNORMAL HIGH (ref 70–99)
Glucose-Capillary: 157 mg/dL — ABNORMAL HIGH (ref 70–99)
Glucose-Capillary: 130 mg/dL — ABNORMAL HIGH (ref 70–99)
Glucose-Capillary: 162 mg/dL — ABNORMAL HIGH (ref 70–99)

## 2024-01-20 LAB — MAGNESIUM: Magnesium: 2.1 mg/dL (ref 1.7–2.4)

## 2024-01-20 MED ORDER — CLOPIDOGREL BISULFATE 75 MG PO TABS
75.0000 mg | ORAL_TABLET | Freq: Every day | ORAL | Status: DC
  Administered 2024-01-20 – 2024-01-23 (×4): 75 mg via ORAL
  Filled 2024-01-20 (×4): qty 1

## 2024-01-20 MED ORDER — ASPIRIN 81 MG PO TBEC
81.0000 mg | DELAYED_RELEASE_TABLET | Freq: Every day | ORAL | Status: DC
  Administered 2024-01-20 – 2024-01-23 (×4): 81 mg via ORAL
  Filled 2024-01-20 (×4): qty 1

## 2024-01-20 MED ORDER — INSULIN ASPART 100 UNIT/ML IJ SOLN
0.0000 [IU] | Freq: Three times a day (TID) | INTRAMUSCULAR | Status: DC
  Administered 2024-01-20: 2 [IU] via SUBCUTANEOUS
  Administered 2024-01-20: 1 [IU] via SUBCUTANEOUS
  Administered 2024-01-21: 2 [IU] via SUBCUTANEOUS
  Administered 2024-01-22 (×2): 1 [IU] via SUBCUTANEOUS
  Filled 2024-01-20 (×2): qty 1
  Filled 2024-01-20: qty 2
  Filled 2024-01-20: qty 1
  Filled 2024-01-20: qty 2

## 2024-01-20 MED ORDER — ROSUVASTATIN CALCIUM 20 MG PO TABS
20.0000 mg | ORAL_TABLET | Freq: Every day | ORAL | Status: DC
  Administered 2024-01-20 – 2024-01-23 (×4): 20 mg via ORAL
  Filled 2024-01-20 (×4): qty 1

## 2024-01-20 MED ORDER — STROKE: EARLY STAGES OF RECOVERY BOOK
Freq: Once | Status: AC
  Filled 2024-01-20: qty 1

## 2024-01-20 MED ORDER — POTASSIUM CHLORIDE CRYS ER 20 MEQ PO TBCR
40.0000 meq | EXTENDED_RELEASE_TABLET | Freq: Once | ORAL | Status: AC
  Administered 2024-01-20: 40 meq via ORAL
  Filled 2024-01-20: qty 2

## 2024-01-20 MED ORDER — SODIUM CHLORIDE 0.9 % IV SOLN: INTRAVENOUS | Status: AC

## 2024-01-20 MED FILL — Lidocaine HCl Local Preservative Free (PF) Inj 1%: INTRAMUSCULAR | Qty: 30 | Status: AC

## 2024-01-20 NOTE — Plan of Care (Signed)
°  Problem: Activity: Goal: Ability to tolerate increased activity will improve Outcome: Progressing   Problem: Education: Goal: Knowledge of General Education information will improve Description: Including pain rating scale, medication(s)/side effects and non-pharmacologic comfort measures Outcome: Progressing   Problem: Health Behavior/Discharge Planning: Goal: Ability to manage health-related needs will improve Outcome: Progressing   Problem: Clinical Measurements: Goal: Ability to maintain clinical measurements within normal limits will improve Outcome: Progressing   Problem: Activity: Goal: Risk for activity intolerance will decrease Outcome: Progressing   Problem: Nutrition: Goal: Adequate nutrition will be maintained Outcome: Progressing   Problem: Pain Managment: Goal: General experience of comfort will improve and/or be controlled Outcome: Progressing   Problem: Nutritional: Goal: Maintenance of adequate nutrition will improve Outcome: Progressing

## 2024-01-20 NOTE — Telephone Encounter (Signed)
 Pt's daughter called stating that the pt is currently admitted in the hospital and is scheduled for some upcoming appts that pt will probably not going to make it to d/t hospitalization.  Pt's daughter want to speak with Dr Lanny if possible regarding the pt's hospitalization and the changes in pt's POC d/t his recent hospitalization.  Pt's daughter stated that the pt had a stroke.  Stated this nurse will make Dr Lanny aware of the pt's daughter's call.

## 2024-01-20 NOTE — Progress Notes (Addendum)
 Stroke Expertise provided to patient. Resource guide reviewed with bedside nurses for the day, Leighann and Kristyn. SRN performed NIHSS and discussed scoring with bedside nurses and answered any questions regarding NIHSS and orders. Appropriate stroke care and orders confirmed in place. Patient is awaiting MRI later today for continued stroke work-up.  For any questions related to patient's stroke care, please call:  Stroke Response Nurse (Monday - Friday 0700-1900): [phone] [employer] Nurse (Nights and Weekends): [phone]  Odell Bean, RN

## 2024-01-20 NOTE — Progress Notes (Addendum)
 NAME:  Andrew Hines, MRN:  [id number], DOB:  06-26-1960, LOS: 1 ADMISSION DATE:  01/19/2024, CONSULTATION DATE:  12/9 REFERRING MD:  byrum, CHIEF COMPLAINT:  shock    History of Present Illness:  63 year old male patient with a past medical history of stage III colon cancer status post hemicolectomy and chemotherapy back in 2022, had been followed by oncology, and referred to pulmonary after PET scan in November 2025 identified new hypermetabolic mesenteric mass as well as multiple small hypermetabolic lung nodules. He was brought to [facility] on 12/9 for robotic assisted fiberoptic bronchoscopy, he was intubated by anesthesia for airway protection, target biopsies were obtained from left upper and lower lobe pulmonary nodules.  Intraoperatively the patient developed acute hypotension with systolic blood pressure in the 50s following  a recruitment maneuver.  He then became progressively bradycardic, and developed ST elevation lead II and III  He was treated with atropine , started on norepinephrine , had also received phenylephrine , critical care was emergently summoned to bedside as was advanced heart failure.  A stat bedside TTE was obtained showing LVEF estimated at 45% chest x-ray was negative for pneumothorax. He was transferred subsequently to the intensive care for further cardiac evaluation and stabilization.  Pertinent  Medical History  Stage 3 colon cancer with right hemicolectomy back in 2022 with adjuvant chemotherapy CAPOX x 3 months PET scan November 2025 showed hypermetabolic mesenteric mass with multiple small hypermetabolic lung nodules Hypertension Basal cell skin cancer Significant Hospital Events: Including procedures, antibiotic start and stop dates in addition to other pertinent events   12/9 presented for elective bronchoscopy and biopsy of lung nodules.  Developed intraoperative bradycardia hypotension ST elevation and shock.  Started on vasopressors, emergent bedside TTE  estimated EF at 45% transferred to the intensive care for further evaluation, cardiac workup, and stabilization 12/10 Extubated successfully yesterday, developed left sided facial droop, left sided pronator drip with slight dysarthria, last seen normal around 1430  Interim History / Subjective:  Lying in bed, left facial droop still present  But left sided weakness improved, no left sided pronator drift   Alert and oriented x 4, follows commands   Objective    Blood pressure 107/72, pulse 68, temperature 98.2 F (36.8 C), temperature source Oral, resp. rate 14, height 6' (1.829 m), weight 86.2 kg, SpO2 90%.    Vent Mode: PRVC FiO2 (%):  [40 %-60 %] 40 % Set Rate:  [16 bmp] 16 bmp Vt Set:  [620 mL] 620 mL PEEP:  [5 cmH20] 5 cmH20 Plateau Pressure:  [20 cmH20] 20 cmH20   Intake/Output Summary (Last 24 hours) at 01/20/2024 0947 Last data filed at 01/20/2024 0800 Gross per 24 hour  Intake 3659.73 ml  Output 1750 ml  Net 1909.73 ml   Filed Weights   01/18/24 1628 01/19/24 0606 01/20/24 0643  Weight: 92.1 kg 92.5 kg 86.2 kg    Examination: General: acute on chronic adult male, lying in icu bed in NAD  HEENT: Normocephalic, PERRLA intact, pink MM, left facial droop CV: s1,s2, RRR, no MRG, No JVD  pulm: clear, diminished, no distress on RA  Abs: bs active, soft  Extremities: no edema, no deformity, moves all extremities on command  Skin: no rash  Neuro: Rass 0, alert and oriented x4,   GU: deferred   Resolved problem list  Ventilator Management  Assessment and Plan  CVA  Left facial weakness, mild LLE weakness, pronator drift on 12/10  Code stroke called, not a candidate for TNK- low  NIH, and also has lung biopsy performed  CT head w/o contrast- showing concerns of acute infarct, focal hypoattentuation involving right caudate head CTA- negative for LVO  P: Neuro following appreciate assistance  Continue aspirin   Continue frequent neurochecks  Continue levophed  gtt- per  neuro, SBP > 120, okay permissive HTN up to 220  Per neuro, can start plavix  today  Order complete ECHO  Follow up with MRI, pending   Undifferentiated shock, favoring cardiogenic with ST elevation in the inferior leads and bradycardia bedside EF estimated at 45%-  12/9 Coronaries clean per cath ST changes associated with hypotension and possibly with hypokalemia- 2.8  Shock state improving, cardiac cath normal  Anion gap metabolic acidosis-resolved  P:  On levophed  more for permissive HTN per neuro recs, SBP > 120 Continue cardiac tele  Appreciate Cardiology's assistance  Continue to hold antihypertensives  Continue to trend electrolytes, also mag/phos daily   CKD stage III AA baseline serum creatinine around 1.3 Hypokalemia  Serum creatinine elevated to 1.72, high risk for AKI Cr now 1.4  P: Continue to trend renal function daily  Continue to monitor and optimize electrolytes daily Continue to monitor urine output Continue strict I/Os Continue Adequate renal perfusion  Avoid nephrotoxic agents  Continue replacement of potassium, trend daily   Multiple pulmonary nodules, now status post robotic guided biopsy P: Continue to follow pathology   Labs   CBC: Recent Labs  Lab 01/19/24 0636 01/19/24 1030 01/19/24 1446 01/20/24 0333  WBC 6.8  --  9.2 10.4  HGB 16.4 14.3 14.6 15.2  HCT 46.6 42.0 41.2 42.4  MCV 92.1  --  92.2 92.2  PLT 356  --  328 376    Basic Metabolic Panel: Recent Labs  Lab 01/19/24 0636 01/19/24 1030 01/19/24 1446 01/19/24 1752 01/20/24 0333  NA 136 136  --  136 137  K 2.8* 3.0*  --  3.2* 3.5  CL 98  --   --  96* 100  CO2 21*  --   --  26 27  GLUCOSE 122*  --   --  140* 152*  BUN 19  --   --  18 14  CREATININE 1.72*  --  1.62* 1.60* 1.40*  CALCIUM  8.8*  --   --  8.8* 8.7*  MG  --   --  1.9  --  2.1   GFR: Estimated Creatinine Clearance: 57.7 mL/min (A) (by C-G formula based on SCr of 1.4 mg/dL (H)). Recent Labs  Lab 01/19/24 0636  01/19/24 1028 01/19/24 1446 01/20/24 0333  WBC 6.8  --  9.2 10.4  LATICACIDVEN  --  1.5 1.5  --     Liver Function Tests: No results for input(s): AST, ALT, ALKPHOS, BILITOT, PROT, ALBUMIN in the last 168 hours. No results for input(s): LIPASE, AMYLASE in the last 168 hours. No results for input(s): AMMONIA in the last 168 hours.  ABG    Component Value Date/Time   PHART 7.436 01/19/2024 1030   PCO2ART 37.6 01/19/2024 1030   PO2ART 84 01/19/2024 1030   HCO3 25.4 01/19/2024 1030   TCO2 27 01/19/2024 1030   O2SAT 97 01/19/2024 1030     Coagulation Profile: No results for input(s): INR, PROTIME in the last 168 hours.  Cardiac Enzymes: Recent Labs  Lab 01/19/24 0940  CKTOTAL 60  CKMB 0.8    HbA1C: Hgb A1c MFr Bld  Date/Time Value Ref Range Status  01/19/2024 02:42 PM 5.2 4.8 - 5.6 % Final  Comment:    (NOTE) Diagnosis of Diabetes The following HbA1c ranges recommended by the American Diabetes Association (ADA) may be used as an aid in the diagnosis of diabetes mellitus.  Hemoglobin             Suggested A1C NGSP%              Diagnosis  <5.7                   Non Diabetic  5.7-6.4                Pre-Diabetic  >6.4                   Diabetic  <7.0                   Glycemic control for                       adults with diabetes.    06/13/2020 12:50 PM 5.6 4.8 - 5.6 % Final    Comment:    (NOTE) Pre diabetes:          5.7%-6.4%  Diabetes:              >6.4%  Glycemic control for   <7.0% adults with diabetes     CBG: Recent Labs  Lab 01/19/24 1611 01/19/24 1948 01/19/24 2330 01/20/24 0312 01/20/24 0757  GLUCAP 140* 134* 148* 144* 122*    Home Medications  Prior to Admission medications   Medication Sig Start Date End Date Taking? Authorizing Provider  amLODipine (NORVASC) 5 MG tablet Take 5 mg by mouth daily.   Yes [provider]  diphenhydrAMINE  HCl, Sleep, 50 MG CAPS Take 100 mg by mouth at bedtime.    Yes [provider]  hydrochlorothiazide (HYDRODIURIL) 25 MG tablet Take 25 mg by mouth daily. 09/11/23  Yes [provider]  Tetrahydrozoline HCl (VISINE OP) Place 1 drop into both eyes daily.   Yes [provider]  capecitabine  (XELODA ) 500 MG tablet Take 3 tabs in morning and 4 tabs in evening for 14 days then off 7 days. Repeat every 21 days. Take within 30 minutes after meals. Patient not taking: Reported on 01/18/2024 09/24/20   Lanny Callander, MD  Cholecalciferol (VITAMIN D) 50 MCG (2000 UT) tablet Take 2,000 Units by mouth daily. Patient not taking: Reported on 01/18/2024    [provider]  ondansetron  (ZOFRAN ) 8 MG tablet Take 1 tablet (8 mg total) by mouth 2 (two) times daily as needed for refractory nausea / vomiting. Start on day 3 after chemotherapy. Patient not taking: Reported on 01/18/2024 07/23/20   Lanny Callander, MD  potassium chloride  SA (KLOR-CON  M) 20 MEQ tablet Take 2 tablets (40 mEq total) by mouth daily for 3 days, THEN 1 tablet (20 mEq total) daily for 3 days. Patient not taking: Reported on 01/18/2024 11/27/23 01/05/24  Craig Alan SAUNDERS, PA-C  prochlorperazine  (COMPAZINE ) 10 MG tablet Take 1 tablet (10 mg total) by mouth every 6 (six) hours as needed (Nausea or vomiting). Patient not taking: Reported on 01/18/2024 07/04/20   Lanny Callander, MD  sildenafil (VIAGRA) 100 MG tablet Take 100 mg by mouth daily as needed for erectile dysfunction.    [provider]     CC: 50 mins   Christian Smith AGACNP-BC   [facility] 01/20/2024, 12:20 PM  Please see [email or web address] for pager details.  From 7A-7P  if no response, please call [phone]. After hours, please call ELink [phone].      Attending attestation:  Mr. Spellman is a 63 y/o gentleman with a history of colon cancer and pulmonary nodules who presented for bronch for biopsies.Unfortunately during the procedure he developed hypotension and EKG changes concerning for  ACS. He underwent LHC and found to have normal coronary anatomy. He was extubated in the ICU after. Several hours later he developed L facial droop.  BP 122/78 (BP Location: Left Arm)   Pulse 68   Temp 98.2 F (36.8 C) (Oral)   Resp 17   Ht 6' (1.829 m)   Wt 86.2 kg   SpO2 94%   BMI 25.77 kg/m  Elderly man sitting up in bed in NAD, appears younger than stated age [place]/AT, eyes anicteric Breathing comfortably on RA, CTAB S1S2, RRR Abd nondistended No cyanosis or edema Awake, alert, no dysarthria. Answering questions appropriately. No pronator drift, 5/5 leg raise, UE strength. Still mild L lower facial droop.   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 No coronary disease on LHC. -appreciate cardiology's management -aspirin , statin  Pulmonary nodules h/o colon cancer -waiting on cytology -messaged Oncology to let them know he likely will need his Friday appt rescheduled -Supposed to get a port-a-cath next week.   Hyperglycemia -SSI PRN -goal BG 140-180  AKI on CKD 2a, improving -add back IVF after getting contrast twice yesterday and lower BPs -strict I/O -maintain adequate perfusion  Friends updated at bedside with Mr. Holcomb during rounds.    This patient is critically ill with multiple organ system failure which requires frequent high complexity decision making, assessment, support, evaluation, and titration of therapies. This was completed through the application of advanced monitoring technologies and extensive interpretation of multiple databases. During this encounter critical care time was devoted to patient care services described in this note for 36 minutes.  Leita SHAUNNA Gaskins, DO 01/20/24 4:31 PM [facility]  For contact information, see Amion. If no response to pager, please call PCCM consult pager. After hours, 7PM- 7AM, please call Elink.

## 2024-01-20 NOTE — Progress Notes (Addendum)
 STROKE TEAM PROGRESS NOTE    SIGNIFICANT HOSPITAL EVENTS 12/9: Patient underwent bronchoscopy with biopsy and cardiac cath which revealed clean coronary arteries.  Patient then had code stroke called for left-sided facial droop and dysarthria but declined TNK due to elevated risk in the setting of recent biopsy.  INTERIM HISTORY/SUBJECTIVE Patient has remained hemodynamically stable and afebrile overnight.  He reports that his left arm weakness has improved.  OBJECTIVE  CBC    Component Value Date/Time   WBC 10.4 01/20/2024 0333   RBC 4.60 01/20/2024 0333   HGB 15.2 01/20/2024 0333   HGB 13.8 09/24/2020 0903   HCT 42.4 01/20/2024 0333   PLT 376 01/20/2024 0333   PLT 198 09/24/2020 0903   MCV 92.2 01/20/2024 0333   MCH 33.0 01/20/2024 0333   MCHC 35.8 01/20/2024 0333   RDW 12.5 01/20/2024 0333   LYMPHSABS 2.1 11/27/2023 1451   MONOABS 0.6 11/27/2023 1451   EOSABS 0.1 11/27/2023 1451   BASOSABS 0.0 11/27/2023 1451    BMET    Component Value Date/Time   NA 137 01/20/2024 0333   K 3.5 01/20/2024 0333   CL 100 01/20/2024 0333   CO2 27 01/20/2024 0333   GLUCOSE 152 (H) 01/20/2024 0333   BUN 14 01/20/2024 0333   CREATININE 1.40 (H) 01/20/2024 0333   CREATININE 1.09 09/24/2020 0903   CALCIUM  8.7 (L) 01/20/2024 0333   GFRNONAA 56 (L) 01/20/2024 0333   GFRNONAA >60 09/24/2020 0903    IMAGING past 24 hours CT ANGIO HEAD NECK W WO CM (CODE STROKE) Result Date: 01/19/2024 EXAM: CTA HEAD AND NECK WITH AND WITHOUT 01/19/2024 06:42:01 PM TECHNIQUE: CTA of the head and neck was performed with and without the administration of intravenous contrast. Multiplanar 2D and/or 3D reformatted images are provided for review. Automated exposure control, iterative reconstruction, and/or weight based adjustment of the mA/kV was utilized to reduce the radiation dose to as low as reasonably achievable. Stenosis of the internal carotid arteries measured using NASCET criteria. COMPARISON: None  available CLINICAL HISTORY: Neuro deficit, acute, stroke suspected Neuro deficit, acute, stroke suspected FINDINGS: AORTIC ARCH AND ARCH VESSELS: No dissection or arterial injury. No significant stenosis of the brachiocephalic or subclavian arteries. CERVICAL CAROTID ARTERIES: No dissection, arterial injury, or hemodynamically significant stenosis by NASCET criteria. CERVICAL VERTEBRAL ARTERIES: No dissection, arterial injury, or significant stenosis. Limited evaluation proximally due to streak artifact. LUNGS AND MEDIASTINUM: Pulmonary nodules better characterized on 01/16/24 CT chest. SOFT TISSUES: No acute abnormality. BONES: No acute abnormality. ANTERIOR CIRCULATION: No significant stenosis of the internal carotid arteries. No significant stenosis of the anterior cerebral arteries. No significant stenosis of the middle cerebral arteries. No aneurysm. POSTERIOR CIRCULATION: No significant stenosis of the posterior cerebral arteries. No significant stenosis of the basilar artery. No significant stenosis of the vertebral arteries. No aneurysm. OTHER: No dural venous sinus thrombosis on this non-dedicated study. IMPRESSION: 1. No large vessel occlusion or hemodynamically significant stenosis. Electronically signed by: Gilmore Molt MD 01/19/2024 06:53 PM EST RP Workstation: [id number]   CT HEAD CODE STROKE WO CONTRAST Result Date: 01/19/2024 EXAM: CT HEAD WITHOUT 01/19/2024 06:32:06 PM TECHNIQUE: CT of the head was performed without the administration of intravenous contrast. Automated exposure control, iterative reconstruction, and/or weight based adjustment of the mA/kV was utilized to reduce the radiation dose to as low as reasonably achievable. COMPARISON: None available. CLINICAL HISTORY: Neuro deficit, acute, stroke suspected. FINDINGS: BRAIN AND VENTRICLES: Focal hypoattenuation involving the right caudate head concerning for focus of acute infarct.  Atherosclerosis of the carotid siphons. No acute  intracranial hemorrhage. No mass effect or midline shift. No extra-axial fluid collection. No hydrocephalus. ORBITS: No acute abnormality. SINUSES AND MASTOIDS: No acute abnormality. SOFT TISSUES AND SKULL: No acute skull fracture. No acute soft tissue abnormality. [place] stroke program early CT (aspect) score: Ganglionic (caudate, ic, lentiform nucleus, insula, M1-m3): 6 Supraganglionic (m4-m6): 3 Total: 9 IMPRESSION: 1. Focal hypoattenuation involving the right caudate head, concerning for acute infarct. 2. ASPECTS 9. 3. Findings messaged to Dr. Arora via the [facility] messaging system at 6:40 PM on 01/19/24. Electronically signed by: Donnice Mania MD 01/19/2024 06:41 PM EST RP Workstation: [id number]   DG Abd 1 View Result Date: 01/19/2024 CLINICAL DATA:  Orogastric tube placement. EXAM: ABDOMEN - 1 VIEW COMPARISON:  None Available. FINDINGS: Tip and side port of the enteric tube below the diaphragm in the stomach. Nonobstructive upper abdominal bowel gas pattern. Atelectasis in the lung bases. IMPRESSION: Tip and side port of the enteric tube below the diaphragm in the stomach. Electronically Signed   By: Andrea Gasman M.D.   On: 01/19/2024 15:41   CARDIAC CATHETERIZATION Result Date: 01/19/2024   The left ventricular systolic function is normal.   LV end diastolic pressure is mildly elevated.   The left ventricular ejection fraction is 55-65% by visual estimate. Normal coronary anatomy Normal LV function LVEDP 16 mm Hg Plan: ST changes and elevated troponin due to demand ischemia in setting of marked hypotension.    Vitals:   01/20/24 0815 01/20/24 0830 01/20/24 0900 01/20/24 0954  BP: 130/84 107/72 132/75   Pulse:      Resp: 15 14 16 18   Temp:      TempSrc:      SpO2:    94%  Weight:      Height:         PHYSICAL EXAM General:  Alert, well-nourished, well-developed patient in no acute distress Psych:  Mood and affect appropriate for situation CV: Regular rate and rhythm on  monitor Respiratory:  Regular, unlabored respirations on room air   NEURO:  Mental Status: AA&Ox3, patient is able to give clear and coherent history Speech/Language: speech is without dysarthria or aphasia.    Cranial Nerves:  II: PERRL. Visual fields full.  III, IV, VI: EOMI. Eyelids elevate symmetrically.  V: Sensation is intact to light touch and symmetrical to face.  VII: Left facial droop, more evident when smiling VIII: hearing intact to voice. IX, X: Phonation is normal.  KP:Dynloizm shrug 5/5. XII: tongue is midline without fasciculations. Motor: Able to move all 4 extremities with good antigravity strength, no drift Tone: is normal and bulk is normal Sensation- Intact to light touch bilaterally.  Coordination: FTN intact bilaterally Gait- deferred  Most Recent NIH  1a Level of Conscious.: 0 1b LOC Questions: 0 1c LOC Commands: 0 2 Best Gaze: 0 3 Visual: 0 4 Facial Palsy: 1 5a Motor Arm - left: 0 5b Motor Arm - Right: 0 6a Motor Leg - Left: 0 6b Motor Leg - Right: 0 7 Limb Ataxia: 0 8 Sensory: 0 9 Best Language: 0 10 Dysarthria: 0 11 Extinct. and Inatten.: 0 TOTAL: 1   ASSESSMENT/PLAN  Mr. DYLYN MCLAREN is a 63 y.o. male with history of colon cancer status post hemicolectomy and chemotherapy who was originally admitted for scheduled bronchoscopy and lung biopsy.  Patient underwent this procedure, and biopsies were performed, but then he developed hypotension and bradycardia.  ST elevation was noted in inferior leads  on EKG, and he was taken to cardiac cath, which revealed clean coronary arteries.  While he was recovering in the ICU, left-sided facial droop, dysarthria and left arm weakness were noted.  Code stroke was called, but patient declined TNK due to elevated risks in the setting of recent biopsy.  MRI is pending.  Stroke: Likely right brain infarct, etiology: uncertain, but could be from cardiac cath procedure. DDx including hypercoagulable state from  advanced cancer Post procedure left facial droop, slurred speech and left arm pronator drift, now improved. Code Stroke CT head focal attenuation involving right caudate head concerning for acute infarct ASPECTS 9  CTA head & neck no LVO or hemodynamically significant stenosis MRI pending 2D Echo EF 60 to 65%, small pericardial effusion  EKG no afib  LDL 60 HgbA1c 5.2 VTE prophylaxis -Lovenox  No antithrombotic prior to admission, now on aspirin  81 mg daily and clopidogrel  75 mg daily for 3 weeks and then aspirin  alone. Therapy recommendations:  Pending Disposition: Pending  Intraoperative acute coronary syndrome Patient had bradycardia, hypotension and inferior ST elevation during lung biopsy Cardiac cath demonstrated clean coronary arteries Troponin peaked at 1109 Cardiology consulted, appreciate recommendations On DAPT  Colon cancer with lung nodule  Colon cancer stage III status post surgery and chemotherapy in 2022 Found to have mesenteric mass and lung nodule last month Hospital metastatic colon cancer-treatment per outpatient oncologist Status post bronchial biopsy, awaiting results from biopsy of yesterday  History of hypertension Hypotension postprocedure Home meds: Hydrochlorothiazide 25 mg daily Stable On pressor and IV fluid Taper off pressor as able Long-term BP goal normotensive  Hyperlipidemia Home meds: None LDL 60, goal < 70 High intensity statin not indicated as LDL below goal  Other Stroke Risk Factors None  Other Active Problems AKI, creatinine 1.72--1.60--1.40  Hospital day # 1  Patient seen by NP and then by MD, MD to edit note as needed. Cortney E Everitt Clint Kill , MSN, AGACNP-BC [facility] See Amion for schedule and pager information 01/20/2024 10:42 AM   ATTENDING NOTE: I reviewed above note and agree with the assessment and plan. Pt was seen and examined.   No family at bedside.  Patient lying bed, still has left facial droop  but stated left arm weakness and slurred speech has resolved.  Patient symptoms concerning for right brain stroke, however could be embolic given recent cardiac cath procedure.  MRI pending.  On DAPT.  BP goal normotensive, taper off pressor and IV fluid as able.  Will follow  For detailed assessment and plan, please refer to above as I have made changes wherever appropriate.   Ary Cummins, MD PhD Stroke Neurology 01/20/2024 5:34 PM   To contact Stroke Continuity provider, please refer to [email or web address]. After hours, contact General Neurology

## 2024-01-21 ENCOUNTER — Other Ambulatory Visit: Payer: Self-pay | Admitting: Cardiology

## 2024-01-21 DIAGNOSIS — I639 Cerebral infarction, unspecified: Secondary | ICD-10-CM

## 2024-01-21 LAB — LIPOPROTEIN A (LPA): Lipoprotein (a): 22.8 nmol/L (ref <75.0)

## 2024-01-21 LAB — CBC
HCT: 39.9 % (ref 39.0–52.0)
Hemoglobin: 13.7 g/dL (ref 13.0–17.0)
MCH: 31.6 pg (ref 26.0–34.0)
MCHC: 34.3 g/dL (ref 30.0–36.0)
MCV: 92.1 fL (ref 80.0–100.0)
Platelets: 329 K/uL (ref 150–400)
RBC: 4.33 MIL/uL (ref 4.22–5.81)
RDW: 12.7 % (ref 11.5–15.5)
WBC: 9.3 K/uL (ref 4.0–10.5)
nRBC: 0 % (ref 0.0–0.2)

## 2024-01-21 LAB — PHOSPHORUS: Phosphorus: 2 mg/dL — ABNORMAL LOW (ref 2.5–4.6)

## 2024-01-21 LAB — GLUCOSE, CAPILLARY
Glucose-Capillary: 121 mg/dL — ABNORMAL HIGH (ref 70–99)
Glucose-Capillary: 165 mg/dL — ABNORMAL HIGH (ref 70–99)
Glucose-Capillary: 102 mg/dL — ABNORMAL HIGH (ref 70–99)
Glucose-Capillary: 93 mg/dL (ref 70–99)
Glucose-Capillary: 139 mg/dL — ABNORMAL HIGH (ref 70–99)

## 2024-01-21 LAB — BASIC METABOLIC PANEL WITH GFR
Anion gap: 7 (ref 5–15)
BUN: 12 mg/dL (ref 8–23)
CO2: 26 mmol/L (ref 22–32)
Calcium: 8.3 mg/dL — ABNORMAL LOW (ref 8.9–10.3)
Chloride: 103 mmol/L (ref 98–111)
Creatinine, Ser: 1.27 mg/dL — ABNORMAL HIGH (ref 0.61–1.24)
GFR, Estimated: >60 mL/min (ref >60)
Glucose, Bld: 121 mg/dL — ABNORMAL HIGH (ref 70–99)
Potassium: 3.2 mmol/L — ABNORMAL LOW (ref 3.5–5.1)
Sodium: 136 mmol/L (ref 135–145)

## 2024-01-21 LAB — MAGNESIUM: Magnesium: 2 mg/dL (ref 1.7–2.4)

## 2024-01-21 LAB — CYTOLOGY - NON PAP

## 2024-01-21 MED ORDER — MELATONIN 3 MG PO TABS
3.0000 mg | ORAL_TABLET | Freq: Once | ORAL | Status: AC
  Administered 2024-01-21: 3 mg via ORAL
  Filled 2024-01-21: qty 1

## 2024-01-21 MED ORDER — POTASSIUM CHLORIDE CRYS ER 20 MEQ PO TBCR
40.0000 meq | EXTENDED_RELEASE_TABLET | Freq: Once | ORAL | Status: AC
  Administered 2024-01-21: 40 meq via ORAL
  Filled 2024-01-21: qty 2

## 2024-01-21 MED ORDER — MIDODRINE HCL 5 MG PO TABS
5.0000 mg | ORAL_TABLET | Freq: Three times a day (TID) | ORAL | Status: DC
  Administered 2024-01-21 – 2024-01-23 (×5): 5 mg via ORAL
  Filled 2024-01-21 (×5): qty 1

## 2024-01-21 MED ORDER — POTASSIUM PHOSPHATES 15 MMOLE/5ML IV SOLN
15.0000 mmol | Freq: Once | INTRAVENOUS | Status: AC
  Administered 2024-01-21: 15 mmol via INTRAVENOUS
  Filled 2024-01-21: qty 5

## 2024-01-21 NOTE — Progress Notes (Signed)
 eLink Physician-Brief Progress Note Patient Name: Andrew Hines DOB: 1960/10/11 MRN: [id number]   Date of Service  01/21/2024  HPI/Events of Note  Patient asking for a sleep aid.  eICU Interventions  Melatonin 3 mg po ordered.        Baylie Drakes U Alissia Lory 01/21/2024, 9:36 PM

## 2024-01-21 NOTE — Progress Notes (Signed)
 [facility] ADULT ICU REPLACEMENT PROTOCOL   The patient does apply for the [facility] Adult ICU Electrolyte Replacment Protocol based on the criteria listed below:   1.Exclusion criteria: TCTS, ECMO, Dialysis, and Myasthenia Gravis patients 2. Is GFR >/= 30 ml/min? Yes.    Patient's GFR today is >60 3. Is SCr </= 2? Yes.   Patient's SCr is 1.27 mg/dL 4. Did SCr increase >/= 0.5 in 24 hours? No. 5.Pt's weight >40kg  Yes.   6. Abnormal electrolyte(s): Phosphorus, Potassium  7. Electrolytes replaced per protocol 8.  Call MD STAT for K+ </= 2.5, Phos </= 1, or Mag </= 1 Physician:  Dr. Ogan  Andrew Hines 01/21/2024 5:10 AM

## 2024-01-21 NOTE — Evaluation (Signed)
 Occupational Therapy Evaluation Patient Details Name: Andrew Hines MRN: [id number] DOB: Jun 17, 1960 Today's Date: 01/21/2024   History of Present Illness   Pt is a 63 y.o male admitted 12/9 for bronchoscopy lung biopsy. Code stroke activated post-op for L sided facial drift. MRI showed many small acute infarcts in R caudate, bil frontal, parietal, and occipital lobe.  PMH: stage 3 colon cancer s/p R hemicolectomy, HTN, pneumonia     Clinical Impressions Pt admitted based on above, and was seen based on problem list below. PTA pt was independent with ADLs and IADLs. Today pt is at his baseline for ADLs and functional mobility. Pt completed standing ADLs, LB dressing, and STS independently with no AD. Noted vision, coordination, and strength even bilaterally and WFL. Pt reports no self-care concerns upon d/c, no follow up OT needs. Educated pt on benefits of continued mobilization during admission and while receiving chemo to prevent deconditioning, pt verbalized understanding. All education complete, no further acute OT needs, OT is signing off on this pt.     If plan is discharge home, recommend the following:   Assist for transportation;Assistance with cooking/housework     Functional Status Assessment   Patient has not had a recent decline in their functional status     Equipment Recommendations   None recommended by OT      Precautions/Restrictions   Precautions Precautions: Fall Recall of Precautions/Restrictions: Intact Restrictions Weight Bearing Restrictions Per Provider Order: No     Mobility Bed Mobility Overal bed mobility: Modified Independent       General bed mobility comments: HOB elevated    Transfers Overall transfer level: Independent Equipment used: None       General transfer comment: No AD, mobilizing in hallway well. No LOB or sway      Balance Overall balance assessment: Mild deficits observed, not formally tested       ADL  either performed or assessed with clinical judgement   ADL Overall ADL's : At baseline;Independent       General ADL Comments: Completed standing ADLs, LB dressing, and functional transfers Independently     Vision Baseline Vision/History: 0 No visual deficits Ability to See in Adequate Light: 0 Adequate Patient Visual Report: No change from baseline Vision Assessment?: Yes Eye Alignment: Within Functional Limits Ocular Range of Motion: Within Functional Limits Alignment/Gaze Preference: Within Defined Limits Tracking/Visual Pursuits: Able to track stimulus in all quads without difficulty Saccades: Within functional limits Convergence: Within functional limits Visual Fields: No apparent deficits Additional Comments: WFL, pt denies any dizziness, blurred vision, or double vision     Perception Perception: Within Functional Limits       Praxis Praxis: WFL       Pertinent Vitals/Pain Pain Assessment Pain Assessment: Faces Faces Pain Scale: Hurts a little bit Pain Location: Incision site Pain Descriptors / Indicators: Discomfort Pain Intervention(s): Monitored during session     Extremity/Trunk Assessment Upper Extremity Assessment Upper Extremity Assessment: Overall WFL for tasks assessed   Lower Extremity Assessment Lower Extremity Assessment: Defer to PT evaluation   Cervical / Trunk Assessment Cervical / Trunk Assessment: Normal   Communication Communication Communication: No apparent difficulties   Cognition Arousal: Alert Behavior During Therapy: WFL for tasks assessed/performed Cognition: No apparent impairments     Following commands: Intact       Cueing  General Comments   Cueing Techniques: Verbal cues  VSS on RA           Home Living Family/patient expects to  be discharged to:: Private residence Living Arrangements: Non-relatives/Friends Available Help at Discharge: Available PRN/intermittently;Friend(s) Type of Home: Apartment Home  Access: Stairs to enter [employer] of Steps: 8 Entrance Stairs-Rails: None Home Layout: One level     Bathroom Shower/Tub: Chief Strategy Officer: Standard     Home Equipment: None          Prior Functioning/Environment Prior Level of Function : Independent/Modified Independent             Mobility Comments: Ind, no AD, denies falls ADLs Comments: Ind    OT Problem List: Cardiopulmonary status limiting activity   OT Treatment/Interventions: Self-care/ADL training;Patient/family education      OT Goals(Current goals can be found in the care plan section)   Acute Rehab OT Goals Patient Stated Goal: To go home OT Goal Formulation: All assessment and education complete, DC therapy Time For Goal Achievement: 02/04/24 Potential to Achieve Goals: Good   AM-PAC OT 6 Clicks Daily Activity     Outcome Measure Help from another person eating meals?: None Help from another person taking care of personal grooming?: None Help from another person toileting, which includes using toliet, bedpan, or urinal?: None Help from another person bathing (including washing, rinsing, drying)?: None Help from another person to put on and taking off regular upper body clothing?: None Help from another person to put on and taking off regular lower body clothing?: None 6 Click Score: 24   End of Session Equipment Utilized During Treatment: Gait belt Nurse Communication: Mobility status  Activity Tolerance: Patient tolerated treatment well Patient left: Other (comment) (in hallway with PT)  OT Visit Diagnosis: Muscle weakness (generalized) (M62.81)                Time: 9078-9060 OT Time Calculation (min): 18 min Charges:  OT General Charges $OT Visit: 1 Visit OT Evaluation $OT Eval Low Complexity: 1 Low  Andrew Hines, OT  Acute Rehabilitation Services Office [phone] Secure chat preferred   Andrew GORMAN Savers 01/21/2024, 9:56 AM

## 2024-01-21 NOTE — Anesthesia Postprocedure Evaluation (Signed)
 Anesthesia Post Note  Patient: Andrew Hines  Procedure(s) Performed: VIDEO BRONCHOSCOPY WITH ENDOBRONCHIAL NAVIGATION (Bilateral)     Patient location during evaluation: SICU Anesthesia Type: General Level of consciousness: sedated Pain management: pain level controlled Vital Signs Assessment: post-procedure vital signs reviewed and stable Respiratory status: patient remains intubated per anesthesia plan Cardiovascular status: stable Postop Assessment: no apparent nausea or vomiting Anesthetic complications: yes   No notable events documented.                Ladawn Boullion

## 2024-01-21 NOTE — Progress Notes (Addendum)
 STROKE TEAM PROGRESS NOTE    SIGNIFICANT HOSPITAL EVENTS 12/9: Patient underwent bronchoscopy with biopsy and cardiac cath which revealed clean coronary arteries.  Patient then had code stroke called for left-sided facial droop and dysarthria but declined TNK due to elevated risk in the setting of recent biopsy. 12/10: MRI revealed multiple embolic appearing strokes  INTERIM HISTORY/SUBJECTIVE Patient continues to be hemodynamically stable and afebrile.  He continues to have left facial droop but no other symptoms.  MRI revealed multiple, embolic appearing strokes.  OBJECTIVE  CBC    Component Value Date/Time   WBC 9.3 01/21/2024 0150   RBC 4.33 01/21/2024 0150   HGB 13.7 01/21/2024 0150   HGB 13.8 09/24/2020 0903   HCT 39.9 01/21/2024 0150   PLT 329 01/21/2024 0150   PLT 198 09/24/2020 0903   MCV 92.1 01/21/2024 0150   MCH 31.6 01/21/2024 0150   MCHC 34.3 01/21/2024 0150   RDW 12.7 01/21/2024 0150   LYMPHSABS 2.1 11/27/2023 1451   MONOABS 0.6 11/27/2023 1451   EOSABS 0.1 11/27/2023 1451   BASOSABS 0.0 11/27/2023 1451    BMET    Component Value Date/Time   NA 136 01/21/2024 0150   K 3.2 (L) 01/21/2024 0150   CL 103 01/21/2024 0150   CO2 26 01/21/2024 0150   GLUCOSE 121 (H) 01/21/2024 0150   BUN 12 01/21/2024 0150   CREATININE 1.27 (H) 01/21/2024 0150   CREATININE 1.09 09/24/2020 0903   CALCIUM  8.3 (L) 01/21/2024 0150   GFRNONAA >60 01/21/2024 0150   GFRNONAA >60 09/24/2020 0903    IMAGING past 24 hours MR BRAIN WO CONTRAST Result Date: 01/21/2024 EXAM: MRI BRAIN WITHOUT CONTRAST 01/20/2024 05:25:17 PM TECHNIQUE: Multiplanar multisequence MRI of the head/brain was performed without the administration of intravenous contrast. COMPARISON: CT head 01/19/2024 CLINICAL HISTORY: Stroke, follow up. FINDINGS: BRAIN AND VENTRICLES: Many small acute infarcts in the right caudate, bilateral frontal, parietal, and occipital lobes. Additional single punctate acute infarcts in the  left thalamus and cerebellum. Mild edema without mass effect. No intracranial hemorrhage. No mass. No midline shift. No hydrocephalus. The sella is unremarkable. Normal flow voids. ORBITS: No acute abnormality. SINUSES AND MASTOIDS: No acute abnormality. BONES AND SOFT TISSUES: Normal marrow signal. No acute soft tissue abnormality. IMPRESSION: 1. Many small acute infarcts in the right caudate, bilateral frontal, parietal, and occipital lobes. Additional single punctate acute infarcts in the left thalamus and cerebellum. Given involvement of multiple vascular territories, consider an embolic etiology. Electronically signed by: Gilmore Molt MD 01/21/2024 12:42 AM EST RP Workstation: [id number]   ECHOCARDIOGRAM COMPLETE Result Date: 01/20/2024    ECHOCARDIOGRAM REPORT   Patient Name:   Andrew Hines Date of Exam: 01/20/2024 Medical Rec #:  [id number]      Height:       72.0 in Accession #:    [id number]     Weight:       190.0 lb Date of Birth:  06/02/1960      BSA:          2.085 m Patient Age:    63 years       BP:           119/98 mmHg Patient Gender: M              HR:           67 bpm. Exam Location:  Inpatient Procedure: 2D Echo, Cardiac Doppler and Color Doppler (Both Spectral and Color  Flow Doppler were utilized during procedure). Indications:    Stroke  History:        Patient has prior history of Echocardiogram examinations, most                 recent 01/19/2024.  Sonographer:    Sherlean Dubin Referring Phys: [id number] FONDA JAYSON SHARPS IMPRESSIONS  1. Left ventricular ejection fraction, by estimation, is 60 to 65%. The left ventricle has normal function. The left ventricle has no regional wall motion abnormalities. There is mild left ventricular hypertrophy of the basal-septal segment. Left ventricular diastolic parameters are consistent with Grade I diastolic dysfunction (impaired relaxation).  2. Right ventricular systolic function is normal. The right ventricular size is normal.  3. The  mitral valve is normal in structure. No evidence of mitral valve regurgitation. No evidence of mitral stenosis.  4. The aortic valve is normal in structure. Aortic valve regurgitation is not visualized. No aortic stenosis is present.  5. The inferior vena cava is normal in size with <50% respiratory variability, suggesting right atrial pressure of 8 mmHg. Comparison(s): No significant change from prior study. Conclusion(s)/Recommendation(s): No intracardiac source of embolism detected on this transthoracic study. Consider a transesophageal echocardiogram to exclude cardiac source of embolism if clinically indicated. FINDINGS  Left Ventricle: Left ventricular ejection fraction, by estimation, is 60 to 65%. The left ventricle has normal function. The left ventricle has no regional wall motion abnormalities. The left ventricular internal cavity size was normal in size. There is  mild left ventricular hypertrophy of the basal-septal segment. Left ventricular diastolic parameters are consistent with Grade I diastolic dysfunction (impaired relaxation). Right Ventricle: The right ventricular size is normal. No increase in right ventricular wall thickness. Right ventricular systolic function is normal. Left Atrium: Left atrial size was normal in size. Right Atrium: Right atrial size was normal in size. Pericardium: There is no evidence of pericardial effusion. Mitral Valve: The mitral valve is normal in structure. No evidence of mitral valve regurgitation. No evidence of mitral valve stenosis. Tricuspid Valve: The tricuspid valve is normal in structure. Tricuspid valve regurgitation is trivial. No evidence of tricuspid stenosis. Aortic Valve: The aortic valve is normal in structure. Aortic valve regurgitation is not visualized. No aortic stenosis is present. Aortic valve peak gradient measures 4.9 mmHg. Pulmonic Valve: The pulmonic valve was normal in structure. Pulmonic valve regurgitation is not visualized. No evidence of  pulmonic stenosis. Aorta: The aortic root is normal in size and structure. Venous: The inferior vena cava is normal in size with less than 50% respiratory variability, suggesting right atrial pressure of 8 mmHg. IAS/Shunts: No atrial level shunt detected by color flow Doppler.  LEFT VENTRICLE PLAX 2D LVIDd:         4.40 cm   Diastology LVIDs:         2.90 cm   LV e' medial:    4.79 cm/s LV PW:         0.80 cm   LV E/e' medial:  10.6 LV IVS:        1.30 cm   LV e' lateral:   5.44 cm/s LVOT diam:     2.00 cm   LV E/e' lateral: 9.3 LV SV:         59 LV SV Index:   28 LVOT Area:     3.14 cm  RIGHT VENTRICLE             IVC RV Basal diam:  3.20 cm  IVC diam: 1.90 cm RV Mid diam:    2.70 cm RV S prime:     11.10 cm/s TAPSE (M-mode): 1.1 cm LEFT ATRIUM             Index        RIGHT ATRIUM           Index LA diam:        3.00 cm 1.44 cm/m   RA Area:     14.40 cm LA Vol (A2C):   41.4 ml 19.86 ml/m  RA Volume:   31.40 ml  15.06 ml/m LA Vol (A4C):   33.4 ml 16.02 ml/m LA Biplane Vol: 38.6 ml 18.52 ml/m  AORTIC VALVE AV Area (Vmax): 2.87 cm AV Vmax:        111.00 cm/s AV Peak Grad:   4.9 mmHg LVOT Vmax:      101.35 cm/s LVOT Vmean:     65.800 cm/s LVOT VTI:       0.187 m  AORTA Ao Root diam: 3.10 cm Ao Asc diam:  3.30 cm MITRAL VALVE MV Area (PHT): 4.71 cm    SHUNTS MV Decel Time: 161 msec    Systemic VTI:  0.19 m MV E velocity: 50.60 cm/s  Systemic Diam: 2.00 cm MV A velocity: 57.00 cm/s MV E/A ratio:  0.89 Oneil Parchment MD Electronically signed by Oneil Parchment MD Signature Date/Time: 01/20/2024/3:56:58 PM    Final     Vitals:   01/21/24 1015 01/21/24 1100 01/21/24 1115 01/21/24 1130  BP: (!) 133/103 135/87 (!) 149/100 130/66  Pulse:      Resp: 19 (!) 24 18 19   Temp:  98.3 F (36.8 C)    TempSrc:  Oral    SpO2: 98% 94% 96% 98%  Weight:      Height:         PHYSICAL EXAM General:  Alert, well-nourished, well-developed patient in no acute distress Psych:  Mood and affect appropriate for  situation CV: Regular rate and rhythm on monitor Respiratory:  Regular, unlabored respirations on room air   NEURO:  Mental Status: AA&Ox3, patient is able to give clear and coherent history Speech/Language: speech is without dysarthria or aphasia.    Cranial Nerves:  II: PERRL. Visual fields full.  III, IV, VI: EOMI. Eyelids elevate symmetrically.  V: Sensation is intact to light touch and symmetrical to face.  VII: Left facial droop, more evident when smiling VIII: hearing intact to voice. IX, X: Phonation is normal.  KP:Dynloizm shrug 5/5. XII: tongue is midline without fasciculations. Motor: Able to move all 4 extremities with good antigravity strength, no drift Tone: is normal and bulk is normal Sensation- Intact to light touch bilaterally.  Coordination: FTN intact bilaterally Gait- deferred  Most Recent NIH  1a Level of Conscious.: 0 1b LOC Questions: 0 1c LOC Commands: 0 2 Best Gaze: 0 3 Visual: 0 4 Facial Palsy: 1 5a Motor Arm - left: 0 5b Motor Arm - Right: 0 6a Motor Leg - Left: 0 6b Motor Leg - Right: 0 7 Limb Ataxia: 0 8 Sensory: 0 9 Best Language: 0 10 Dysarthria: 0 11 Extinct. and Inatten.: 0 TOTAL: 1   ASSESSMENT/PLAN  Mr. LARAY CORBIT is a 63 y.o. male with history of colon cancer status post hemicolectomy and chemotherapy who was originally admitted for scheduled bronchoscopy and lung biopsy.  Patient underwent this procedure, and biopsies were performed, but then he developed hypotension and bradycardia.  ST elevation was noted in inferior  leads on EKG, and he was taken to cardiac cath, which revealed clean coronary arteries.  While he was recovering in the ICU, left-sided facial droop, dysarthria and left arm weakness were noted.  Code stroke was called, but patient declined TNK due to elevated risks in the setting of recent biopsy.  MRI reveals multiple, embolic appearing strokes.  Will discuss with cardiology whether embolic strokes could simply be  postprocedural or if more investigation for dysrhythmia needs to be performed  Stroke: embolic shower, etiology: uncertain, could be from cardiac cath procedure given the temporal relationship. DDx including occult afib vs. hypercoagulable state from advanced cancer Post procedure left facial droop, slurred speech and left arm pronator drift, now improved. Code Stroke CT head focal attenuation involving right caudate head concerning for acute infarct ASPECTS 9  CTA head & neck no LVO or hemodynamically significant stenosis MRI multiple, embolic appearing strokes in bilateral hemispheres 2D Echo EF 60 to 65%, small pericardial effusion  EKG no afib  30-day cardiac monitor at discharge LDL 60 HgbA1c 5.2 VTE prophylaxis -Lovenox  No antithrombotic prior to admission, now on aspirin  81 mg daily and clopidogrel  75 mg daily for 3 weeks and then aspirin  alone. Therapy recommendations:  No follow up needed  Disposition: Pending  Intraoperative acute coronary syndrome Need to rule out occult afib Patient had bradycardia, hypotension and inferior ST elevation during lung biopsy Cardiac cath demonstrated clean coronary arteries Troponin peaked at 1109 Tele showed frequent PACs Pt mom has afib Recommend 30 day cardiac event monitoring as outpt to rule out afib On DAPT  Colon cancer with lung nodule  Colon cancer stage III status post surgery and chemotherapy in 2022 Found to have mesenteric mass and lung nodule last month Hospital metastatic colon cancer-treatment per outpatient oncologist Status post bronchial biopsy, awaiting results from biopsy   History of hypertension Hypotension postprocedure Home meds: Hydrochlorothiazide 25 mg daily Stable On pressor and IV fluid Taper off pressor as able Long-term BP goal normotensive  Hyperlipidemia Home meds: None LDL 60, goal < 70 High intensity statin not indicated as LDL below goal  Other Stroke Risk Factors None  Other Active  Problems AKI, creatinine 1.72--1.60--1.40--1.27  Hospital day # 2  Patient seen by NP and then by MD, MD to edit note as needed. Cortney E Everitt Clint Kill , MSN, AGACNP-BC [facility] See Amion for schedule and pager information 01/21/2024 12:24 PM  ATTENDING NOTE: I reviewed above note and agree with the assessment and plan. Pt was seen and examined.   No family is at the bedside. Pt lying in bed, stated that the only life style changes recently was he quit heavy alcohol about 4 weeks ago. Today his left facial droop much improved. MRI showed multifocal bilateral anterior and posterior small infarcts. Etiology not quite certain, but likely from cardiac procedure. However, he does have frequent PACs and bigeminy on tele and family hx of afib, so will recommend 30 day monitoring to rule out afib. He has hx of colon Ca with recent mesenteric and lung findings, can not rule out advanced cancer related hypercoagulable state. Will recommend DAPT for 3 weeks for now and then ASA alone. Continue malignancy work up and heart monitoring. PT and OT no recommendation   For detailed assessment and plan, please refer to above as I have made changes wherever appropriate.   Neurology will sign off. Please call with questions. Pt will follow up with stroke clinic NP at [facility] in about 4 weeks. Thanks  for the consult.   Ary Cummins, MD PhD Stroke Neurology 01/21/2024 2:36 PM      To contact Stroke Continuity provider, please refer to [email or web address]. After hours, contact General Neurology

## 2024-01-21 NOTE — Evaluation (Signed)
 Physical Therapy Evaluation Patient Details Name: Andrew Hines MRN: [id number] DOB: 08/05/1960 Today's Date: 01/21/2024  History of Present Illness  Pt is a 63 y.o male admitted 12/9 for bronchoscopy lung biopsy. Code stroke activated post-op for L sided facial drift. MRI showed many small acute infarcts in R caudate, bil frontal, parietal, and occipital lobe.  PMH: stage 3 colon cancer s/p R hemicolectomy, HTN, pneumonia   Clinical Impression  PTA pt was independent for mobility and ADLs with no AD. Pt reported being at baseline for mobility with slightly decreased gait speed due to fatigue. Pt was independent to stand and ModI to ambulate 8102ft with no AD. Pt has intermittent assist available upon d/c home. Pt feels comfortable d/c home whenever medically stable. No further acute or post-acute PT needs with acute PT signing off. Please re-consult if new needs arise.   BP 155/106 (122) 97% SpO2 on RA 96-100 BPM        If plan is discharge home, recommend the following: Assist for transportation;Help with stairs or ramp for entrance   Can travel by private vehicle    Yes    Equipment Recommendations None recommended by PT     Functional Status Assessment Patient has had a recent decline in their functional status and demonstrates the ability to make significant improvements in function in a reasonable and predictable amount of time.     Precautions / Restrictions Precautions Precautions: Fall Recall of Precautions/Restrictions: Intact Restrictions Weight Bearing Restrictions Per Provider Order: No      Mobility  Bed Mobility  General bed mobility comments: NT, in hallway with OT upon arrival    Transfers Overall transfer level: Independent Equipment used: None   Ambulation/Gait Ambulation/Gait assistance: Modified independent (Device/Increase time) Gait Distance (Feet): 800 Feet Assistive device: None Gait Pattern/deviations: WFL(Within Functional Limits)   Gait  velocity interpretation: >2.62 ft/sec, indicative of community ambulatory   General Gait Details: steady gait with pt reporting slightly decreased gait speed compared to normal  Modified Rankin (Stroke Patients Only) Modified Rankin (Stroke Patients Only) Pre-Morbid Rankin Score: No symptoms Modified Rankin: No symptoms     Balance Overall balance assessment: Independent       Pertinent Vitals/Pain Pain Assessment Pain Assessment: Faces Faces Pain Scale: Hurts a little bit Pain Location: Incision site Pain Descriptors / Indicators: Discomfort Pain Intervention(s): Limited activity within patient's tolerance, Monitored during session, Repositioned    Home Living Family/patient expects to be discharged to:: Private residence Living Arrangements: Non-relatives/Friends Available Help at Discharge: Available PRN/intermittently;Friend(s) Type of Home: Apartment Home Access: Stairs to enter Entrance Stairs-Rails: None Entrance Stairs-Number of Steps: 8   Home Layout: One level Home Equipment: None      Prior Function Prior Level of Function : Independent/Modified Independent    Mobility Comments: Ind, no AD, denies falls ADLs Comments: Ind     Extremity/Trunk Assessment   Upper Extremity Assessment Upper Extremity Assessment: Defer to OT evaluation    Lower Extremity Assessment Lower Extremity Assessment: Overall WFL for tasks assessed    Cervical / Trunk Assessment Cervical / Trunk Assessment: Normal  Communication   Communication Communication: No apparent difficulties    Cognition Arousal: Alert Behavior During Therapy: WFL for tasks assessed/performed   PT - Cognitive impairments: No apparent impairments    Following commands: Intact       Cueing Cueing Techniques: Verbal cues     General Comments General comments (skin integrity, edema, etc.): VSS on RA    Exercises Other Exercises Other  Exercises: Bx10 marches with no UE support      PT  Assessment Patient does not need any further PT services         PT Goals (Current goals can be found in the Care Plan section)  Acute Rehab PT Goals PT Goal Formulation: All assessment and education complete, DC therapy     AM-PAC PT 6 Clicks Mobility  Outcome Measure Help needed turning from your back to your side while in a flat bed without using bedrails?: None Help needed moving from lying on your back to sitting on the side of a flat bed without using bedrails?: None Help needed moving to and from a bed to a chair (including a wheelchair)?: None Help needed standing up from a chair using your arms (e.g., wheelchair or bedside chair)?: None Help needed to walk in hospital room?: None Help needed climbing 3-5 steps with a railing? : None 6 Click Score: 24    End of Session Equipment Utilized During Treatment: Gait belt Activity Tolerance: Patient tolerated treatment well Patient left: in chair;with call bell/phone within reach Nurse Communication: Mobility status PT Visit Diagnosis: Other abnormalities of gait and mobility (R26.89)    Time: 9063-9049 PT Time Calculation (min) (ACUTE ONLY): 14 min   Charges:   PT Evaluation $PT Eval Low Complexity: 1 Low   PT General Charges $$ ACUTE PT VISIT: 1 Visit       Andrew Hines, PT, DPT Secure Chat Preferred  Rehab Office [phone]   Andrew Hines Andrew Hines 01/21/2024, 10:50 AM

## 2024-01-21 NOTE — Plan of Care (Signed)
   Problem: Activity: Goal: Ability to tolerate increased activity will improve Outcome: Progressing

## 2024-01-21 NOTE — Progress Notes (Signed)
 30d monitor ordered for Stroke. Dr. Jeffrie to read, follow up arranged

## 2024-01-21 NOTE — Progress Notes (Signed)
 NAME:  Andrew Hines, MRN:  [id number], DOB:  10-Jun-1960, LOS: 2 ADMISSION DATE:  01/19/2024, CONSULTATION DATE:  12/9 REFERRING MD:  byrum, CHIEF COMPLAINT:  shock    History of Present Illness:  63 year old male patient with a past medical history of stage III colon cancer status post hemicolectomy and chemotherapy back in 2022, had been followed by oncology, and referred to pulmonary after PET scan in November 2025 identified new hypermetabolic mesenteric mass as well as multiple small hypermetabolic lung nodules. He was brought to [facility] on 12/9 for robotic assisted fiberoptic bronchoscopy, he was intubated by anesthesia for airway protection, target biopsies were obtained from left upper and lower lobe pulmonary nodules.  Intraoperatively the patient developed acute hypotension with systolic blood pressure in the 50s following  a recruitment maneuver.  He then became progressively bradycardic, and developed ST elevation lead II and III  He was treated with atropine , started on norepinephrine , had also received phenylephrine , critical care was emergently summoned to bedside as was advanced heart failure.  A stat bedside TTE was obtained showing LVEF estimated at 45% chest x-ray was negative for pneumothorax. He was transferred subsequently to the intensive care for further cardiac evaluation and stabilization.  Pertinent  Medical History  Stage 3 colon cancer with right hemicolectomy back in 2022 with adjuvant chemotherapy CAPOX x 3 months PET scan November 2025 showed hypermetabolic mesenteric mass with multiple small hypermetabolic lung nodules Hypertension Basal cell skin cancer Significant Hospital Events: Including procedures, antibiotic start and stop dates in addition to other pertinent events   12/9 presented for elective bronchoscopy and biopsy of lung nodules.  Developed intraoperative bradycardia hypotension ST elevation and shock.  Started on vasopressors, emergent bedside TTE  estimated EF at 45% transferred to the intensive care for further evaluation, cardiac workup, and stabilization 12/10 Extubated successfully yesterday, developed left sided facial droop, left sided pronator drip with slight dysarthria, last seen normal around 1430  Interim History / Subjective:  Lying in bed, left facial droop still present  But left sided weakness improved, no left sided pronator drift   Alert and oriented x 4, follows commands   Objective    Blood pressure (!) 121/91, pulse 69, temperature 98.5 F (36.9 C), temperature source Oral, resp. rate (!) 23, height 6' (1.829 m), weight 88.9 kg, SpO2 94%.        Intake/Output Summary (Last 24 hours) at 01/21/2024 0942 Last data filed at 01/21/2024 0801 Gross per 24 hour  Intake 1540.93 ml  Output 1500 ml  Net 40.93 ml   Filed Weights   01/19/24 0606 01/20/24 0643 01/21/24 0530  Weight: 92.5 kg 86.2 kg 88.9 kg    Examination: General: pleasant acute and chronic, older adult male, sitting up in ICU, NAD HEENT: Normocephalic, PERRLA intact, left facial droop  CV: s1,s2, RRR, no JVD, no MRG  pulm: clear, diminished, throughout  Abs: bs active  Extremities: moves extremities to command, no deficits, equal strength LUE/RUE Skin: no rash  Neuro: alert and oriented x4, follows commands, left facial droop  GU: deferred   Resolved problem list  Ventilator Management  Undifferentiated shock, favoring cardiogenic with ST elevation in the inferior leads and bradycardia bedside EF estimated at 45%- Assessment and Plan  CVA  Left facial weakness, mild LLE weakness, pronator drift on 12/10  Code stroke called, not a candidate for TNK- low NIH, and also has lung biopsy performed  CT head w/o contrast- showing concerns of acute infarct, focal hypoattentuation involving  right caudate head CTA- negative for LVO  MRI Brain 12/10- many small acute infarcts in right caudate, B/L frontal, parietal and occipital lobes. Single  punctuate acute infarcts in left thalamus/cerebellum. Mild edema without mass effect. Negative for intracranial hemorrhage, no hydrocephalus.  Echocardiogram complete 12/10-EF 60 to 65%, LV normal function, LV has no regional wall abnormalities, mild left ventricular hypertrophy of the basal septal segment, grade 1 diastolic dysfunction, RV systolic function normal, RV size normal P: Neuro following appreciate assistance  Continue ASA and plavix  for 3 weeks per neuro recs, and then will transition just to ASA alone  Currently on levophed  for permissive HTN, 48hrs of LWK would be 1430 -recommend begin decreasing levophed  this afternoon or tomorrow morning, can continue normal saline at 50ml/hr for now   Undifferentiated shock, favoring cardiogenic with ST elevation in the inferior leads and bradycardia bedside EF estimated at 45%- resolved   12/9 Coronaries clean per cath ST changes associated with hypotension and possibly with hypokalemia- 2.8  Shock state improving, cardiac cath normal  Anion gap metabolic acidosis-resolved  P:  Continue Levophed  for permissive hypertensive for 48 hours and begin weaning Levophed  hopefully this afternoon or by tomorrow for normotensive goal parameters Continue cardiac telemetry Continue to hold antihypertensives for now Replace potassium and phosphorus-already ordered Continue to check electrolytes daily along with mag and Phos  CKD stage III AA baseline serum creatinine around 1.3 Hypokalemia-K 3.2 Hypophosphatemia-2 Serum creatinine elevated to 1.72, high risk for AKI Cr now 1.4 > 1.27 P: Continue to trend renal function daily  Continue to monitor and optimize electrolytes daily Continue to monitor urine output Continue strict I/Os Continue Adequate renal perfusion  Avoid nephrotoxic agents  Continue replacement of potassium, trend daily   Hyperglycemia CBG at goal  P: Continue SSI   Multiple pulmonary nodules, now status post robotic  guided biopsy P: Continue to follow up with pathology  Continue to follow up outpatiewnt   Labs   CBC: Recent Labs  Lab 01/19/24 0636 01/19/24 0937 01/19/24 1030 01/19/24 1446 01/20/24 0333 01/21/24 0150  WBC 6.8  --   --  9.2 10.4 9.3  HGB 16.4 14.3 14.3 14.6 15.2 13.7  HCT 46.6 42.0 42.0 41.2 42.4 39.9  MCV 92.1  --   --  92.2 92.2 92.1  PLT 356  --   --  328 376 329    Basic Metabolic Panel: Recent Labs  Lab 01/19/24 0636 01/19/24 0937 01/19/24 1030 01/19/24 1446 01/19/24 1752 01/20/24 0333 01/21/24 0150  NA 136 135 136  --  136 137 136  K 2.8* 2.6* 3.0*  --  3.2* 3.5 3.2*  CL 98  --   --   --  96* 100 103  CO2 21*  --   --   --  26 27 26   GLUCOSE 122*  --   --   --  140* 152* 121*  BUN 19  --   --   --  18 14 12   CREATININE 1.72*  --   --  1.62* 1.60* 1.40* 1.27*  CALCIUM  8.8*  --   --   --  8.8* 8.7* 8.3*  MG  --   --   --  1.9  --  2.1 2.0  PHOS  --   --   --   --   --   --  2.0*   GFR: Estimated Creatinine Clearance: 63.6 mL/min (A) (by C-G formula based on SCr of 1.27 mg/dL (H)). Recent Labs  Lab 01/19/24 0636 01/19/24 1028 01/19/24 1446 01/20/24 0333 01/21/24 0150  WBC 6.8  --  9.2 10.4 9.3  LATICACIDVEN  --  1.5 1.5  --   --     Liver Function Tests: No results for input(s): AST, ALT, ALKPHOS, BILITOT, PROT, ALBUMIN in the last 168 hours. No results for input(s): LIPASE, AMYLASE in the last 168 hours. No results for input(s): AMMONIA in the last 168 hours.  ABG    Component Value Date/Time   PHART 7.436 01/19/2024 1030   PCO2ART 37.6 01/19/2024 1030   PO2ART 84 01/19/2024 1030   HCO3 25.4 01/19/2024 1030   TCO2 27 01/19/2024 1030   O2SAT 97 01/19/2024 1030     Coagulation Profile: No results for input(s): INR, PROTIME in the last 168 hours.  Cardiac Enzymes: Recent Labs  Lab 01/19/24 0940  CKTOTAL 60  CKMB 0.8    HbA1C: Hgb A1c MFr Bld  Date/Time Value Ref Range Status  01/19/2024 02:42 PM 5.2 4.8 -  5.6 % Final    Comment:    (NOTE) Diagnosis of Diabetes The following HbA1c ranges recommended by the American Diabetes Association (ADA) may be used as an aid in the diagnosis of diabetes mellitus.  Hemoglobin             Suggested A1C NGSP%              Diagnosis  <5.7                   Non Diabetic  5.7-6.4                Pre-Diabetic  >6.4                   Diabetic  <7.0                   Glycemic control for                       adults with diabetes.    06/13/2020 12:50 PM 5.6 4.8 - 5.6 % Final    Comment:    (NOTE) Pre diabetes:          5.7%-6.4%  Diabetes:              >6.4%  Glycemic control for   <7.0% adults with diabetes     CBG: Recent Labs  Lab 01/20/24 1122 01/20/24 1730 01/20/24 2117 01/21/24 0624 01/21/24 0809  GLUCAP 157* 130* 162* 121* 165*    Home Medications  Prior to Admission medications   Medication Sig Start Date End Date Taking? Authorizing Provider  amLODipine (NORVASC) 5 MG tablet Take 5 mg by mouth daily.   Yes [provider]  diphenhydrAMINE  HCl, Sleep, 50 MG CAPS Take 100 mg by mouth at bedtime.   Yes [provider]  hydrochlorothiazide (HYDRODIURIL) 25 MG tablet Take 25 mg by mouth daily. 09/11/23  Yes [provider]  Tetrahydrozoline HCl (VISINE OP) Place 1 drop into both eyes daily.   Yes [provider]  capecitabine  (XELODA ) 500 MG tablet Take 3 tabs in morning and 4 tabs in evening for 14 days then off 7 days. Repeat every 21 days. Take within 30 minutes after meals. Patient not taking: Reported on 01/18/2024 09/24/20   Lanny Callander, MD  Cholecalciferol (VITAMIN D) 50 MCG (2000 UT) tablet Take 2,000 Units by mouth daily. Patient not taking: Reported on 01/18/2024    [provider]  ondansetron  (ZOFRAN ) 8 MG tablet Take 1 tablet (8 mg total) by mouth 2 (two) times daily as needed for refractory nausea / vomiting. Start on day 3 after chemotherapy. Patient not taking: Reported on  01/18/2024 07/23/20   Lanny Callander, MD  potassium chloride  SA (KLOR-CON  M) 20 MEQ tablet Take 2 tablets (40 mEq total) by mouth daily for 3 days, THEN 1 tablet (20 mEq total) daily for 3 days. Patient not taking: Reported on 01/18/2024 11/27/23 01/05/24  Craig Alan SAUNDERS, PA-C  prochlorperazine  (COMPAZINE ) 10 MG tablet Take 1 tablet (10 mg total) by mouth every 6 (six) hours as needed (Nausea or vomiting). Patient not taking: Reported on 01/18/2024 07/04/20   Lanny Callander, MD  sildenafil (VIAGRA) 100 MG tablet Take 100 mg by mouth daily as needed for erectile dysfunction.    [provider]     CC: 45 mins   Christian Niti Leisure AGACNP-BC   [facility] 01/21/2024, 9:42 AM  Please see [email or web address] for pager details.  From 7A-7P if no response, please call [phone]. After hours, please call ELink [phone].

## 2024-01-22 ENCOUNTER — Inpatient Hospital Stay: Attending: Hematology | Admitting: Hematology

## 2024-01-22 ENCOUNTER — Other Ambulatory Visit: Payer: Self-pay

## 2024-01-22 ENCOUNTER — Inpatient Hospital Stay

## 2024-01-22 ENCOUNTER — Inpatient Hospital Stay: Admitting: Hematology

## 2024-01-22 ENCOUNTER — Encounter (HOSPITAL_COMMUNITY): Payer: Self-pay | Admitting: Emergency Medicine

## 2024-01-22 DIAGNOSIS — N1831 Chronic kidney disease, stage 3a: Secondary | ICD-10-CM

## 2024-01-22 DIAGNOSIS — R918 Other nonspecific abnormal finding of lung field: Secondary | ICD-10-CM | POA: Diagnosis not present

## 2024-01-22 DIAGNOSIS — I6310 Cerebral infarction due to embolism of unspecified precerebral artery: Secondary | ICD-10-CM

## 2024-01-22 DIAGNOSIS — I959 Hypotension, unspecified: Secondary | ICD-10-CM

## 2024-01-22 LAB — CBC
HCT: 43.4 % (ref 39.0–52.0)
Hemoglobin: 15.3 g/dL (ref 13.0–17.0)
MCH: 32.4 pg (ref 26.0–34.0)
MCHC: 35.3 g/dL (ref 30.0–36.0)
MCV: 91.9 fL (ref 80.0–100.0)
Platelets: 324 K/uL (ref 150–400)
RBC: 4.72 MIL/uL (ref 4.22–5.81)
RDW: 12.8 % (ref 11.5–15.5)
WBC: 8.5 K/uL (ref 4.0–10.5)
nRBC: 0 % (ref 0.0–0.2)

## 2024-01-22 LAB — GLUCOSE, CAPILLARY
Glucose-Capillary: 123 mg/dL — ABNORMAL HIGH (ref 70–99)
Glucose-Capillary: 131 mg/dL — ABNORMAL HIGH (ref 70–99)
Glucose-Capillary: 83 mg/dL (ref 70–99)
Glucose-Capillary: 121 mg/dL — ABNORMAL HIGH (ref 70–99)

## 2024-01-22 LAB — BASIC METABOLIC PANEL WITH GFR
Anion gap: 8 (ref 5–15)
BUN: 9 mg/dL (ref 8–23)
CO2: 23 mmol/L (ref 22–32)
Calcium: 8.2 mg/dL — ABNORMAL LOW (ref 8.9–10.3)
Chloride: 104 mmol/L (ref 98–111)
Creatinine, Ser: 1.21 mg/dL (ref 0.61–1.24)
GFR, Estimated: >60 mL/min (ref >60)
Glucose, Bld: 114 mg/dL — ABNORMAL HIGH (ref 70–99)
Potassium: 3.7 mmol/L (ref 3.5–5.1)
Sodium: 135 mmol/L (ref 135–145)

## 2024-01-22 LAB — PHOSPHORUS: Phosphorus: 2.9 mg/dL (ref 2.5–4.6)

## 2024-01-22 LAB — MAGNESIUM: Magnesium: 1.9 mg/dL (ref 1.7–2.4)

## 2024-01-22 MED ORDER — IPRATROPIUM-ALBUTEROL 0.5-2.5 (3) MG/3ML IN SOLN
3.0000 mL | Freq: Two times a day (BID) | RESPIRATORY_TRACT | Status: DC
  Administered 2024-01-23: 3 mL via RESPIRATORY_TRACT
  Filled 2024-01-22: qty 3

## 2024-01-22 MED ORDER — MELATONIN 3 MG PO TABS
3.0000 mg | ORAL_TABLET | Freq: Every evening | ORAL | Status: DC | PRN
  Administered 2024-01-22: 3 mg via ORAL
  Filled 2024-01-22: qty 1

## 2024-01-22 MED ORDER — MAGNESIUM SULFATE 2 GM/50ML IV SOLN
2.0000 g | Freq: Once | INTRAVENOUS | Status: AC
  Administered 2024-01-22: 2 g via INTRAVENOUS
  Filled 2024-01-22: qty 50

## 2024-01-22 MED ORDER — POTASSIUM CHLORIDE CRYS ER 20 MEQ PO TBCR
40.0000 meq | EXTENDED_RELEASE_TABLET | Freq: Once | ORAL | Status: AC
  Administered 2024-01-22: 40 meq via ORAL
  Filled 2024-01-22: qty 2

## 2024-01-22 NOTE — Progress Notes (Addendum)
 Andrew Hines   DOB:Aug 12, 1960   FM#:[id number]      ASSESSMENT & PLAN:  Andrew Hines is a 63 year old male patient admitted on 01/19/2024 with complaints of left facial weakness and concern for small stroke.  Code stroke was called.  Oncologic history significant for metastatic colon cancer.  Medical oncology following.  Colon cancer, stage IIIb, pT3 pN1b M0 - Diagnosed in 05/30/2020. - Status post right hemicolectomy 06/13/2020. - Status post adjuvant chemo with CapeOx x 3 months. - PET scan done 12/29/2023 shows concern for right central mesenteric mass with multiple pulmonary nodules, suspicious for metastatic disease. - Patient had outpatient oncology appointment to start chemotherapy however due to this admission, therapy will be rescheduled.   - Medical oncology/Dr. Lanny following and will make further evaluation and treatment recommendations.  Stroke - Patient appears stable today.  Patient admitted 01/19/2024 with concern for stroke - Neuro and cardiology teams managing.  Peripheral neuropathy - Likely secondary to previous chemotherapy CapeOx patient received.  Patient requesting a different regimen this time due to those side effects.  FOLFOX is being planned with a lower dose of oxaliplatin  to minimize neuropathy. - Continue to monitor closely.  Hypertension Hyperlipidemia - Continue to monitor blood pressure   Code Status Full   Subjective:  Patient seen awake and alert laying in bed.  States that he feels okay.  Denies chest pain or other acute pain.  Denies headaches dizziness or lightheadedness.  No acute distress is noted.  Objective:   Intake/Output Summary (Last 24 hours) at 01/22/2024 1215 Last data filed at 01/22/2024 1200 Gross per 24 hour  Intake 1580.42 ml  Output 1625 ml  Net -44.58 ml     PHYSICAL EXAMINATION: ECOG PERFORMANCE STATUS: 2 - Symptomatic, <50% confined to bed  Vitals:   01/22/24 1146 01/22/24 1200  BP:  120/87  Pulse:    Resp: 20  19  Temp:    SpO2: 95% 95%   Filed Weights   01/20/24 0643 01/21/24 0530 01/22/24 0600  Weight: 190 lb 0.6 oz (86.2 kg) 195 lb 15.8 oz (88.9 kg) (P) 195 lb 8.8 oz (88.7 kg)    GENERAL: alert, no distress and comfortable SKIN: skin color, texture, turgor are normal, no rashes or significant lesions EYES: normal, conjunctiva are pink and non-injected, sclera clear OROPHARYNX: no exudate, no erythema and lips, buccal mucosa, and tongue normal  NECK: supple, thyroid  normal size, non-tender, without nodularity LYMPH: no palpable lymphadenopathy in the cervical, axillary or inguinal LUNGS: clear to auscultation and percussion with normal breathing effort HEART: regular rate & rhythm and no murmurs and no lower extremity edema ABDOMEN: abdomen soft, non-tender and normal bowel sounds MUSCULOSKELETAL: no cyanosis of digits and no clubbing  PSYCH: alert & oriented x 3 with fluent speech NEURO: no focal motor/sensory deficits   All questions were answered. The patient knows to call the clinic with any problems, questions or concerns.   The total time spent in the appointment was 40 minutes encounter with patient including review of chart and various tests results, discussions about plan of care and coordination of care plan  Olam JINNY Brunner, NP 01/22/2024 12:15 PM    Labs Reviewed:  Lab Results  Component Value Date   WBC 8.5 01/22/2024   HGB 15.3 01/22/2024   HCT 43.4 01/22/2024   MCV 91.9 01/22/2024   PLT 324 01/22/2024   Recent Labs    11/27/23 1451 12/09/23 1317 01/19/24 0636 01/20/24 0333 01/21/24 0150 01/22/24 9478  NA 139 136   < > 137 136 135  K 2.9* 3.2*   < > 3.5 3.2* 3.7  CL 94* 96   < > 100 103 104  CO2 34* 30   < > 27 26 23   GLUCOSE 99 103*   < > 152* 121* 114*  BUN 19 15   < > 14 12 9   CREATININE 1.38 1.45   < > 1.40* 1.27* 1.21  CALCIUM  9.8 9.3   < > 8.7* 8.3* 8.2*  GFRNONAA  --   --    < > 56* >60 >60  PROT 8.9* 8.2  --   --   --   --   ALBUMIN 4.5 4.1   --   --   --   --   AST 41* 49*  --   --   --   --   ALT 21 20  --   --   --   --   ALKPHOS 111 99  --   --   --   --   BILITOT 0.4 0.8  --   --   --   --    < > = values in this interval not displayed.    Studies Reviewed:   MR BRAIN WO CONTRAST Result Date: 01/21/2024 EXAM: MRI BRAIN WITHOUT CONTRAST 01/20/2024 05:25:17 PM TECHNIQUE: Multiplanar multisequence MRI of the head/brain was performed without the administration of intravenous contrast. COMPARISON: CT head 01/19/2024 CLINICAL HISTORY: Stroke, follow up. FINDINGS: BRAIN AND VENTRICLES: Many small acute infarcts in the right caudate, bilateral frontal, parietal, and occipital lobes. Additional single punctate acute infarcts in the left thalamus and cerebellum. Mild edema without mass effect. No intracranial hemorrhage. No mass. No midline shift. No hydrocephalus. The sella is unremarkable. Normal flow voids. ORBITS: No acute abnormality. SINUSES AND MASTOIDS: No acute abnormality. BONES AND SOFT TISSUES: Normal marrow signal. No acute soft tissue abnormality. IMPRESSION: 1. Many small acute infarcts in the right caudate, bilateral frontal, parietal, and occipital lobes. Additional single punctate acute infarcts in the left thalamus and cerebellum. Given involvement of multiple vascular territories, consider an embolic etiology. Electronically signed by: Andrew Molt MD 01/21/2024 12:42 AM EST RP Workstation: [id number]   ECHOCARDIOGRAM COMPLETE Result Date: 01/20/2024    ECHOCARDIOGRAM REPORT   Patient Name:   Andrew Hines Date of Exam: 01/20/2024 Medical Rec #:  [id number]      Height:       72.0 in Accession #:    [id number]     Weight:       190.0 lb Date of Birth:  1961-01-03      BSA:          2.085 m Patient Age:    63 years       BP:           119/98 mmHg Patient Gender: M              HR:           67 bpm. Exam Location:  Inpatient Procedure: 2D Echo, Cardiac Doppler and Color Doppler (Both Spectral and Color            Flow  Doppler were utilized during procedure). Indications:    Stroke  History:        Patient has prior history of Echocardiogram examinations, most                 recent 01/19/2024.  Sonographer:    Sherlean Dubin  Referring Phys: [id number] FONDA BROCKS SMITH IMPRESSIONS  1. Left ventricular ejection fraction, by estimation, is 60 to 65%. The left ventricle has normal function. The left ventricle has no regional wall motion abnormalities. There is mild left ventricular hypertrophy of the basal-septal segment. Left ventricular diastolic parameters are consistent with Grade I diastolic dysfunction (impaired relaxation).  2. Right ventricular systolic function is normal. The right ventricular size is normal.  3. The mitral valve is normal in structure. No evidence of mitral valve regurgitation. No evidence of mitral stenosis.  4. The aortic valve is normal in structure. Aortic valve regurgitation is not visualized. No aortic stenosis is present.  5. The inferior vena cava is normal in size with <50% respiratory variability, suggesting right atrial pressure of 8 mmHg. Comparison(s): No significant change from prior study. Conclusion(s)/Recommendation(s): No intracardiac source of embolism detected on this transthoracic study. Consider a transesophageal echocardiogram to exclude cardiac source of embolism if clinically indicated. FINDINGS  Left Ventricle: Left ventricular ejection fraction, by estimation, is 60 to 65%. The left ventricle has normal function. The left ventricle has no regional wall motion abnormalities. The left ventricular internal cavity size was normal in size. There is  mild left ventricular hypertrophy of the basal-septal segment. Left ventricular diastolic parameters are consistent with Grade I diastolic dysfunction (impaired relaxation). Right Ventricle: The right ventricular size is normal. No increase in right ventricular wall thickness. Right ventricular systolic function is normal. Left Atrium: Left  atrial size was normal in size. Right Atrium: Right atrial size was normal in size. Pericardium: There is no evidence of pericardial effusion. Mitral Valve: The mitral valve is normal in structure. No evidence of mitral valve regurgitation. No evidence of mitral valve stenosis. Tricuspid Valve: The tricuspid valve is normal in structure. Tricuspid valve regurgitation is trivial. No evidence of tricuspid stenosis. Aortic Valve: The aortic valve is normal in structure. Aortic valve regurgitation is not visualized. No aortic stenosis is present. Aortic valve peak gradient measures 4.9 mmHg. Pulmonic Valve: The pulmonic valve was normal in structure. Pulmonic valve regurgitation is not visualized. No evidence of pulmonic stenosis. Aorta: The aortic root is normal in size and structure. Venous: The inferior vena cava is normal in size with less than 50% respiratory variability, suggesting right atrial pressure of 8 mmHg. IAS/Shunts: No atrial level shunt detected by color flow Doppler.  LEFT VENTRICLE PLAX 2D LVIDd:         4.40 cm   Diastology LVIDs:         2.90 cm   LV e' medial:    4.79 cm/s LV PW:         0.80 cm   LV E/e' medial:  10.6 LV IVS:        1.30 cm   LV e' lateral:   5.44 cm/s LVOT diam:     2.00 cm   LV E/e' lateral: 9.3 LV SV:         59 LV SV Index:   28 LVOT Area:     3.14 cm  RIGHT VENTRICLE             IVC RV Basal diam:  3.20 cm     IVC diam: 1.90 cm RV Mid diam:    2.70 cm RV S prime:     11.10 cm/s TAPSE (M-mode): 1.1 cm LEFT ATRIUM             Index        RIGHT ATRIUM  Index LA diam:        3.00 cm 1.44 cm/m   RA Area:     14.40 cm LA Vol (A2C):   41.4 ml 19.86 ml/m  RA Volume:   31.40 ml  15.06 ml/m LA Vol (A4C):   33.4 ml 16.02 ml/m LA Biplane Vol: 38.6 ml 18.52 ml/m  AORTIC VALVE AV Area (Vmax): 2.87 cm AV Vmax:        111.00 cm/s AV Peak Grad:   4.9 mmHg LVOT Vmax:      101.35 cm/s LVOT Vmean:     65.800 cm/s LVOT VTI:       0.187 m  AORTA Ao Root diam: 3.10 cm Ao Asc diam:   3.30 cm MITRAL VALVE MV Area (PHT): 4.71 cm    SHUNTS MV Decel Time: 161 msec    Systemic VTI:  0.19 m MV E velocity: 50.60 cm/s  Systemic Diam: 2.00 cm MV A velocity: 57.00 cm/s MV E/A ratio:  0.89 Oneil Parchment MD Electronically signed by Oneil Parchment MD Signature Date/Time: 01/20/2024/3:56:58 PM    Final    ECHOCARDIOGRAM LIMITED Result Date: 01/19/2024    ECHOCARDIOGRAM LIMITED REPORT   Patient Name:   NICHOLAOS SCHIPPERS Date of Exam: 01/19/2024 Medical Rec #:  [id number]    Height:       72.0 in Accession #:    [id number]   Weight:       203.9 lb Date of Birth:  28-Sep-1960    BSA:          2.148 m Patient Age:    63 years     BP:           105/073 mmHg Patient Gender: M            HR:           120 bpm. Exam Location:  Inpatient Procedure: Limited Echo (Both Spectral and Color Flow Doppler were utilized            during procedure). Indications:     CHF-acute systolic 150.21  History:         Patient has no prior history of Echocardiogram examinations.                  Previous Myocardial Infarction.  Sonographer:     Merlynn Argyle Referring Phys:  Oneil Parchment MD Diagnosing Phys: Kardie Tobb DO  Sonographer Comments: Technically difficult study due to poor echo windows and Technically challenging study due to limited acoustic windows. IMPRESSIONS  1. Left ventricular ejection fraction, by estimation, is 60 to 65%. The left ventricle has normal function. Left ventricular diastolic function could not be evaluated.  2. Right ventricular systolic function is normal. The right ventricular size is normal.  3. A small pericardial effusion is present. The pericardial effusion is circumferential.  4. The mitral valve was not assessed. No evidence of mitral stenosis.  5. The aortic valve was not well visualized.  6. The inferior vena cava is dilated in size with <50% respiratory variability, suggesting right atrial pressure of 15 mmHg. FINDINGS  Left Ventricle: Left ventricular ejection fraction, by estimation, is 60 to 65%. The  left ventricle has normal function. The left ventricular internal cavity size was small. Left ventricular diastolic function could not be evaluated. Right Ventricle: The right ventricular size is normal. Right ventricular systolic function is normal. Left Atrium: Left atrial size was not assessed. Right Atrium: Right atrial size was not assessed. Pericardium: A small pericardial effusion  is present. The pericardial effusion is circumferential. Presence of epicardial fat layer. Mitral Valve: The mitral valve was not assessed. No evidence of mitral valve stenosis. Tricuspid Valve: The tricuspid valve is normal in structure. No evidence of tricuspid stenosis. Aortic Valve: The aortic valve was not well visualized. Pulmonic Valve: The pulmonic valve was not well visualized. Aorta: The ascending aorta was not well visualized and the aortic root was not well visualized. Venous: The inferior vena cava is dilated in size with less than 50% respiratory variability, suggesting right atrial pressure of 15 mmHg. IVC IVC diam: 2.30 cm Kardie Tobb DO Electronically signed by Kardie Tobb DO Signature Date/Time: 01/19/2024/8:10:08 PM    Final    CT ANGIO HEAD NECK W WO CM (CODE STROKE) Result Date: 01/19/2024 EXAM: CTA HEAD AND NECK WITH AND WITHOUT 01/19/2024 06:42:01 PM TECHNIQUE: CTA of the head and neck was performed with and without the administration of intravenous contrast. Multiplanar 2D and/or 3D reformatted images are provided for review. Automated exposure control, iterative reconstruction, and/or weight based adjustment of the mA/kV was utilized to reduce the radiation dose to as low as reasonably achievable. Stenosis of the internal carotid arteries measured using NASCET criteria. COMPARISON: None available CLINICAL HISTORY: Neuro deficit, acute, stroke suspected Neuro deficit, acute, stroke suspected FINDINGS: AORTIC ARCH AND ARCH VESSELS: No dissection or arterial injury. No significant stenosis of the  brachiocephalic or subclavian arteries. CERVICAL CAROTID ARTERIES: No dissection, arterial injury, or hemodynamically significant stenosis by NASCET criteria. CERVICAL VERTEBRAL ARTERIES: No dissection, arterial injury, or significant stenosis. Limited evaluation proximally due to streak artifact. LUNGS AND MEDIASTINUM: Pulmonary nodules better characterized on 01/16/24 CT chest. SOFT TISSUES: No acute abnormality. BONES: No acute abnormality. ANTERIOR CIRCULATION: No significant stenosis of the internal carotid arteries. No significant stenosis of the anterior cerebral arteries. No significant stenosis of the middle cerebral arteries. No aneurysm. POSTERIOR CIRCULATION: No significant stenosis of the posterior cerebral arteries. No significant stenosis of the basilar artery. No significant stenosis of the vertebral arteries. No aneurysm. OTHER: No dural venous sinus thrombosis on this non-dedicated study. IMPRESSION: 1. No large vessel occlusion or hemodynamically significant stenosis. Electronically signed by: Andrew Molt MD 01/19/2024 06:53 PM EST RP Workstation: [id number]   CT HEAD CODE STROKE WO CONTRAST Result Date: 01/19/2024 EXAM: CT HEAD WITHOUT 01/19/2024 06:32:06 PM TECHNIQUE: CT of the head was performed without the administration of intravenous contrast. Automated exposure control, iterative reconstruction, and/or weight based adjustment of the mA/kV was utilized to reduce the radiation dose to as low as reasonably achievable. COMPARISON: None available. CLINICAL HISTORY: Neuro deficit, acute, stroke suspected. FINDINGS: BRAIN AND VENTRICLES: Focal hypoattenuation involving the right caudate head concerning for focus of acute infarct. Atherosclerosis of the carotid siphons. No acute intracranial hemorrhage. No mass effect or midline shift. No extra-axial fluid collection. No hydrocephalus. ORBITS: No acute abnormality. SINUSES AND MASTOIDS: No acute abnormality. SOFT TISSUES AND SKULL: No acute  skull fracture. No acute soft tissue abnormality. [place] stroke program early CT (aspect) score: Ganglionic (caudate, ic, lentiform nucleus, insula, M1-m3): 6 Supraganglionic (m4-m6): 3 Total: 9 IMPRESSION: 1. Focal hypoattenuation involving the right caudate head, concerning for acute infarct. 2. ASPECTS 9. 3. Findings messaged to Dr. Arora via the [facility] messaging system at 6:40 PM on 01/19/24. Electronically signed by: Donnice Mania MD 01/19/2024 06:41 PM EST RP Workstation: [id number]   DG Abd 1 View Result Date: 01/19/2024 CLINICAL DATA:  Orogastric tube placement. EXAM: ABDOMEN - 1 VIEW COMPARISON:  None Available. FINDINGS: Tip and  side port of the enteric tube below the diaphragm in the stomach. Nonobstructive upper abdominal bowel gas pattern. Atelectasis in the lung bases. IMPRESSION: Tip and side port of the enteric tube below the diaphragm in the stomach. Electronically Signed   By: Andrea Gasman M.D.   On: 01/19/2024 15:41   CARDIAC CATHETERIZATION Result Date: 01/19/2024   The left ventricular systolic function is normal.   LV end diastolic pressure is mildly elevated.   The left ventricular ejection fraction is 55-65% by visual estimate. Normal coronary anatomy Normal LV function LVEDP 16 mm Hg Plan: ST changes and elevated troponin due to demand ischemia in setting of marked hypotension.   CT SUPER D CHEST WO CONTRAST Result Date: 01/19/2024 CLINICAL DATA:  Pulmonary nodule.  Pre-procedure planning. EXAM: CT CHEST WITHOUT CONTRAST TECHNIQUE: Multidetector CT imaging of the chest was performed using thin slice collimation for electromagnetic bronchoscopy planning purposes, without intravenous contrast. RADIATION DOSE REDUCTION: This exam was performed according to the departmental dose-optimization program which includes automated exposure control, adjustment of the mA and/or kV according to patient size and/or use of iterative reconstruction technique. COMPARISON:  PET-CT 12/29/2023  FINDINGS: Cardiovascular: The heart size is normal. No substantial pericardial effusion. Coronary artery calcification is evident. Mild atherosclerotic calcification is noted in the wall of the thoracic aorta. Mediastinum/Nodes: No mediastinal lymphadenopathy. No evidence for gross hilar lymphadenopathy although assessment is limited by the lack of intravenous contrast on the current study. The esophagus has normal imaging features. There is no axillary lymphadenopathy. Lungs/Pleura: As before, multiple bilateral pulmonary nodules are evident. 1 of the more dominant pulmonary nodules is in the left lower lobe measuring 12 mm on 95/302, similar to prior. 12 mm medial right lower lobe nodule on 90/302 is also similar to prior (11 mm previously). Subpleural index right middle lobe pulmonary nodule measuring 9 mm on 77/302 was 8 mm previously. No focal airspace consolidation. No pleural effusion. Upper Abdomen: 11 mm hypodensity in the posterior right hepatic dome is stable since prior, likely benign. Surgical changes noted in the hepatic flexure. Musculoskeletal: No worrisome lytic or sclerotic osseous abnormality. IMPRESSION: 1. Multiple bilateral pulmonary nodules, similar to prior. 2.  Aortic Atherosclerosis (ICD10-I70.0). Electronically Signed   By: Camellia Candle M.D.   On: 01/19/2024 10:16   DG Chest Port 1 View Result Date: 01/19/2024 CLINICAL DATA:  Status post bronchoscopy. EXAM: PORTABLE CHEST 1 VIEW COMPARISON:  01/29/2006 FINDINGS: Endotracheal tube tip is 3.4 cm above the base of the carina. Low lung volumes without edema or dense focal consolidation. Defibrillator pad overlies the left hemithorax. Telemetry leads overlie the chest. IMPRESSION: Endotracheal tube tip is 3.4 cm above the base of the carina. No acute cardiopulmonary findings. Electronically Signed   By: Camellia Candle M.D.   On: 01/19/2024 10:10   DG C-ARM BRONCHOSCOPY Result Date: 01/19/2024 C-ARM BRONCHOSCOPY: Fluoroscopy was utilized  by the requesting physician.  No radiographic interpretation.   DG C-Arm 1-60 Min-No Report Result Date: 01/19/2024 Fluoroscopy was utilized by the requesting physician.  No radiographic interpretation.   DG C-Arm 1-60 Min-No Report Result Date: 01/19/2024 Fluoroscopy was utilized by the requesting physician.  No radiographic interpretation.   DG C-Arm 1-60 Min-No Report Result Date: 01/19/2024 Fluoroscopy was utilized by the requesting physician.  No radiographic interpretation.   NM PET Image Initial (PI) Skull Base To Thigh (F-18 FDG) Result Date: 12/30/2023 EXAM: PET AND CT SKULL BASE TO MID THIGH 12/29/2023 05:53:26 PM TECHNIQUE: RADIOPHARMACEUTICAL: 9.86 mCi F-18 FDG Uptake time  60 minutes. Glucose level 108 mg/dl. Blood pool SUV 2.5. PET imaging was acquired from the base of the skull to the mid thighs. Non-contrast enhanced computed tomography was obtained for attenuation correction and anatomic localization. COMPARISON: None available. CLINICAL HISTORY: Upper abdominal mesenteric mass. FINDINGS: HEAD AND NECK: No metabolically active cervical lymphadenopathy. Left common carotid atheromatous vascular calcification. CHEST: 9 x 7 mm left upper lobe pulmonary nodule on image 20 of series 7 with maximum SUV of 3.7. 8 x 5 mm right middle lobe nodule on image 39 series 7 with maximum SUV 3.3. Peribronchovascular nodule in the left lower lobe on image 41 series 7, maximum SUV 3.7. Other nodules are faintly metabolic but are below sensitive PET CT size thresholds. No metabolically active lymphadenopathy. Aortic and left anterior descending coronary artery atherosclerosis. ABDOMEN AND PELVIS: Right eccentric central mesenteric mass 5.9 x 4.0 cm on image 125 series 4, maximum SUV 9.4, compatible with malignancy. No hypermetabolic liver lesion. Diffuse hepatic steatosis. 1 cm right hepatic lobe cyst on image 98 series 4. Right hemicolectomy. Physiologic activity within the gastrointestinal and  genitourinary systems. Systemic atherosclerosis is present, including the aorta and iliac arteries. Indirect right inguinal hernia contains adipose tissue extending down towards the scrotum. BONES AND SOFT TISSUE: No abnormal FDG activity localizes to the bones. No metabolically active aggressive osseous lesion. IMPRESSION: 1. Right eccentric central mesenteric mass, 5.9 x 4.0 cm, maximum SUV 9.4, compatible with malignancy. 2. Multiple hypermetabolic pulmonary nodules, largest 9 x 7 mm in the left upper lobe with maximum SUV 3.7, suspicious for metastatic disease. 3. Atherosclerosis. 4. Indirect right inguinal hernia contains adipose tissue. Electronically signed by: Ryan Salvage MD 12/30/2023 09:39 AM EST RP Workstation: [id number]    Addendum I have seen the patient, examined him. I agree with the assessment and and plan and have edited the notes.   Patient clinically feel okay, has any chest pain, still has left facial droop, weakness has resolved.  He still on low-dose pressor, clinically improving.  I reviewed his biopsy results, which showed atypical cells, immunostains were not contributory, this is being reviewed with in pathology department.  I have canceled his port placement next week, I do not think he is a candidate for intravenous chemo due to his recent STEMI and stroke.  I plan to see him back after discharge, and obtain additional NGS test on peripheral blood, and awaiting for his final surgical path.  All questions were answered.  Onita Mattock  01/22/2024

## 2024-01-22 NOTE — Progress Notes (Signed)
 NAME:  Andrew Hines, MRN:  [id number], DOB:  28-Sep-1960, LOS: 3 ADMISSION DATE:  01/19/2024, CONSULTATION DATE:  12/9 REFERRING MD:  byrum, CHIEF COMPLAINT:  shock    History of Present Illness:  63 year old male patient with a past medical history of stage III colon cancer status post hemicolectomy and chemotherapy back in 2022, had been followed by oncology, and referred to pulmonary after PET scan in November 2025 identified new hypermetabolic mesenteric mass as well as multiple small hypermetabolic lung nodules. He was brought to [facility] on 12/9 for robotic assisted fiberoptic bronchoscopy, he was intubated by anesthesia for airway protection, target biopsies were obtained from left upper and lower lobe pulmonary nodules.  Intraoperatively the patient developed acute hypotension with systolic blood pressure in the 50s following  a recruitment maneuver.  He then became progressively bradycardic, and developed ST elevation lead II and III  He was treated with atropine , started on norepinephrine , had also received phenylephrine , critical care was emergently summoned to bedside as was advanced heart failure.  A stat bedside TTE was obtained showing LVEF estimated at 45% chest x-ray was negative for pneumothorax. He was transferred subsequently to the intensive care for further cardiac evaluation and stabilization.  Pertinent  Medical History  Stage 3 colon cancer with right hemicolectomy back in 2022 with adjuvant chemotherapy CAPOX x 3 months PET scan November 2025 showed hypermetabolic mesenteric mass with multiple small hypermetabolic lung nodules Hypertension Basal cell skin cancer Significant Hospital Events: Including procedures, antibiotic start and stop dates in addition to other pertinent events   12/9 presented for elective bronchoscopy and biopsy of lung nodules.  Developed intraoperative bradycardia hypotension ST elevation and shock.  Started on vasopressors, emergent bedside TTE  estimated EF at 45% transferred to the intensive care for further evaluation, cardiac workup, and stabilization 12/10 Extubated successfully yesterday, developed left sided facial droop, left sided pronator drip with slight dysarthria, last seen normal around 1430  Interim History / Subjective:  No new complaints. Remains on ~56mcg NE.  Objective    Blood pressure 107/66, pulse 87, temperature 98.1 F (36.7 C), temperature source Oral, resp. rate 15, height 6' (1.829 m), weight (P) 88.7 kg, SpO2 94%.        Intake/Output Summary (Last 24 hours) at 01/22/2024 2219 Last data filed at 01/22/2024 1800 Gross per 24 hour  Intake 1101.88 ml  Output 1325 ml  Net -223.12 ml   Filed Weights   01/20/24 0643 01/21/24 0530 01/22/24 0600  Weight: 86.2 kg 88.9 kg (P) 88.7 kg    Examination: General: elderly man sitting up in bed in NAD, appears younger than stated age HEENT: [place]/AT, eyes anicteric CV: S1S2, RRR  pulm: breathing comfortably on RA Abs: mildly TTP in epigastrum and LUQ  Extremities: no edema Neuro: awake, alert, left facial droop, no deficits in extremities  Labs reviewed.  Resolved problem list  Ventilator Management  Hypokalemia Hypophosphatemia Assessment and Plan  Acute embolic CVAs Left facial weakness, mild LLE weakness, pronator drift on 12/10  -can titrate off NE for MAP >65 -DAPT x 3weeks, then aspirin  monotherapy -daily statin -needs cardiac event monitor as OP to r/o Afib -appreciate Neurolog's management  Undifferentiated shock, not cardiogenic with normal biventricular function Type II MI> normal coronaries; LVEF 60-65% -tele monitoring -holding PTA antihypertensives  CKD stage III A baseline serum creatinine around 1.3 -strict I/O -renally dose meds, avoid nephrotoxic meds -maintain adequate perfusion  Hyperglycemia -SSI PRN -goal bG 140-180  Multiple pulmonary nodules, now  status post robotic guided biopsy> specimens were not specific but  suspicious for malignancy -Follow up with oncology> due to normal EF soon after his hypotensive event and normal coronary anatomy, he is likely ok to start chemo.   Labs   CBC: Recent Labs  Lab 01/19/24 0636 01/19/24 0937 01/19/24 1030 01/19/24 1446 01/20/24 0333 01/21/24 0150 01/22/24 0521  WBC 6.8  --   --  9.2 10.4 9.3 8.5  HGB 16.4   < > 14.3 14.6 15.2 13.7 15.3  HCT 46.6   < > 42.0 41.2 42.4 39.9 43.4  MCV 92.1  --   --  92.2 92.2 92.1 91.9  PLT 356  --   --  328 376 329 324   < > = values in this interval not displayed.    Basic Metabolic Panel: Recent Labs  Lab 01/19/24 0636 01/19/24 0937 01/19/24 1030 01/19/24 1446 01/19/24 1752 01/20/24 0333 01/21/24 0150 01/22/24 0521  NA 136   < > 136  --  136 137 136 135  K 2.8*   < > 3.0*  --  3.2* 3.5 3.2* 3.7  CL 98  --   --   --  96* 100 103 104  CO2 21*  --   --   --  26 27 26 23   GLUCOSE 122*  --   --   --  140* 152* 121* 114*  BUN 19  --   --   --  18 14 12 9   CREATININE 1.72*  --   --  1.62* 1.60* 1.40* 1.27* 1.21  CALCIUM  8.8*  --   --   --  8.8* 8.7* 8.3* 8.2*  MG  --   --   --  1.9  --  2.1 2.0 1.9  PHOS  --   --   --   --   --   --  2.0* 2.9   < > = values in this interval not displayed.    This patient is critically ill with multiple organ system failure which requires frequent high complexity decision making, assessment, support, evaluation, and titration of therapies. This was completed through the application of advanced monitoring technologies and extensive interpretation of multiple databases. During this encounter critical care time was devoted to patient care services described in this note for 31 minutes.   Leita SHAUNNA Gaskins, DO 01/22/2024 10:19 PM [facility]  For contact information, see Amion. If no response to pager, please call PCCM consult pager. After hours, 7PM- 7AM, please call Elink.

## 2024-01-22 NOTE — Discharge Instructions (Signed)

## 2024-01-22 NOTE — Plan of Care (Signed)
°  Problem: Activity: Goal: Ability to tolerate increased activity will improve Outcome: Progressing   Problem: Respiratory: Goal: Ability to maintain a clear airway and adequate ventilation will improve Outcome: Progressing   Problem: Clinical Measurements: Goal: Ability to maintain clinical measurements within normal limits will improve Outcome: Progressing Goal: Will remain free from infection Outcome: Progressing Goal: Diagnostic test results will improve Outcome: Progressing Goal: Respiratory complications will improve Outcome: Progressing Goal: Cardiovascular complication will be avoided Outcome: Progressing

## 2024-01-22 NOTE — Evaluation (Signed)
 Speech Language Pathology Evaluation Patient Details Name: Andrew Hines MRN: [id number] DOB: [birth year] Today's Date: 01/22/2024 Time: 8943-8888 SLP Time Calculation (min) (ACUTE ONLY): 15 min  Problem List:  Patient Active Problem List   Diagnosis Date Noted   On mechanically assisted ventilation (HCC) 01/19/2024   Facial droop 01/19/2024   Hypokalemia 01/19/2024   Lung nodules 01/05/2024   Malignant neoplasm of ascending colon (HCC) 07/03/2020   S/P right hemicolectomy 06/13/2020   Past Medical History:  Past Medical History:  Diagnosis Date   Colon cancer (HCC) 04/17/2020   HBP (high blood pressure) 2025   Pneumonia    2020    Skin cancer, basal cell    Past Surgical History:  Past Surgical History:  Procedure Laterality Date   APPENDECTOMY     COLONOSCOPY  2025   LAPAROSCOPIC RIGHT HEMI COLECTOMY N/A 06/13/2020   Procedure: LAPAROSCOPIC RIGHT HEMI COLECTOMY AND PARTIAL OMENTECTOMY WITH LYSIS OF ADHESIONS;  Surgeon: Teresa Lonni HERO, MD;  Location: [facility] ORS;  Service: General;  Laterality: N/A;   LEFT HEART CATH AND CORONARY ANGIOGRAPHY N/A 01/19/2024   Procedure: LEFT HEART CATH AND CORONARY ANGIOGRAPHY;  Surgeon: Jordan, Peter M, MD;  Location: [facility] INVASIVE CV LAB;  Service: Cardiovascular;  Laterality: N/A;   HPI:  Mr. Andrew Hines is a 63 y.o. male with history of colon cancer status post hemicolectomy and chemotherapy who was originally admitted for scheduled bronchoscopy and lung biopsy.  Patient underwent this procedure, and biopsies were performed, but then he developed hypotension and bradycardia.  ST elevation was noted in inferior leads on EKG, and he was taken to cardiac cath, which revealed clean coronary arteries.  While he was recovering in the ICU, left-sided facial droop, dysarthria and left arm weakness were noted.  Code stroke was called, but patient declined TNK due to elevated risks in the setting of recent biopsy. Many small acute infarcts in the  right caudate, bilateral frontal, parietal,  and occipital lobes. Additional single punctate acute infarcts in the left  thalamus and cerebellum.   Assessment / Plan / Recommendation Clinical Impression  Pt demonstrates no acute cognitive or linguistic impairment. Subtests of the COGNISTAT administered. Pt successful with memory, language, mathematical reasoning and social research officer, government. No SLP f/u needed will sign off.    SLP Assessment  SLP Recommendation/Assessment: Patient does not need any further Speech Language Pathology Services     Assistance Recommended at Discharge   none  Functional Status Assessment    Frequency and Duration           SLP Evaluation Cognition  Orientation Level: Oriented X4       Comprehension  Auditory Comprehension Overall Auditory Comprehension: Appears within functional limits for tasks assessed    Expression Verbal Expression Overall Verbal Expression: Appears within functional limits for tasks assessed   Oral / Motor  Oral Motor/Sensory Function Overall Oral Motor/Sensory Function: Within functional limits Motor Speech Overall Motor Speech: Appears within functional limits for tasks assessed            Lace Chenevert, Consuelo Fitch 01/22/2024, 11:14 AM

## 2024-01-22 NOTE — Progress Notes (Signed)
 Spoke to Dr. Lanny about canceling appointment for port placement in IR Monday given current hospitalization and potential need to alter long term chemo plans at next oncology appt after discharge. She agrees with this. Appointment cancelled.   Johana Hopkinson NP 01/22/2024 3:07 PM

## 2024-01-23 ENCOUNTER — Other Ambulatory Visit: Payer: Self-pay | Admitting: Physician Assistant

## 2024-01-23 DIAGNOSIS — I6310 Cerebral infarction due to embolism of unspecified precerebral artery: Secondary | ICD-10-CM

## 2024-01-23 LAB — BASIC METABOLIC PANEL WITH GFR
Anion gap: 7 (ref 5–15)
BUN: 8 mg/dL (ref 8–23)
CO2: 22 mmol/L (ref 22–32)
Calcium: 8 mg/dL — ABNORMAL LOW (ref 8.9–10.3)
Chloride: 106 mmol/L (ref 98–111)
Creatinine, Ser: 1.19 mg/dL (ref 0.61–1.24)
GFR, Estimated: >60 mL/min (ref >60)
Glucose, Bld: 103 mg/dL — ABNORMAL HIGH (ref 70–99)
Potassium: 4.3 mmol/L (ref 3.5–5.1)
Sodium: 135 mmol/L (ref 135–145)

## 2024-01-23 LAB — CBC
HCT: 41.4 % (ref 39.0–52.0)
Hemoglobin: 14.3 g/dL (ref 13.0–17.0)
MCH: 31.8 pg (ref 26.0–34.0)
MCHC: 34.5 g/dL (ref 30.0–36.0)
MCV: 92.2 fL (ref 80.0–100.0)
Platelets: 307 K/uL (ref 150–400)
RBC: 4.49 MIL/uL (ref 4.22–5.81)
RDW: 12.8 % (ref 11.5–15.5)
WBC: 7.1 K/uL (ref 4.0–10.5)
nRBC: 0 % (ref 0.0–0.2)

## 2024-01-23 LAB — MAGNESIUM: Magnesium: 2.2 mg/dL (ref 1.7–2.4)

## 2024-01-23 LAB — GLUCOSE, CAPILLARY
Glucose-Capillary: 99 mg/dL (ref 70–99)
Glucose-Capillary: 99 mg/dL (ref 70–99)
Glucose-Capillary: 81 mg/dL (ref 70–99)

## 2024-01-23 MED ORDER — NITROGLYCERIN 2 % TD OINT
1.0000 [in_us] | TOPICAL_OINTMENT | Freq: Three times a day (TID) | TRANSDERMAL | Status: DC
  Administered 2024-01-23: 1 [in_us] via TOPICAL
  Filled 2024-01-23: qty 30

## 2024-01-23 MED ORDER — PHENTOLAMINE MESYLATE 5 MG IJ SOLR
5.0000 mg | Freq: Once | INTRAMUSCULAR | Status: AC
  Administered 2024-01-23: 5 mg via SUBCUTANEOUS
  Filled 2024-01-23: qty 5

## 2024-01-23 MED ORDER — CLOPIDOGREL BISULFATE 75 MG PO TABS: 75.0000 mg | ORAL_TABLET | Freq: Every day | ORAL | 12 refills | Status: AC

## 2024-01-23 MED ORDER — ASPIRIN 81 MG PO TBEC: 81.0000 mg | DELAYED_RELEASE_TABLET | Freq: Every day | ORAL | 12 refills | Status: AC

## 2024-01-23 MED ORDER — ROSUVASTATIN CALCIUM 20 MG PO TABS: 20.0000 mg | ORAL_TABLET | Freq: Every day | ORAL | 12 refills | Status: AC

## 2024-01-23 NOTE — Plan of Care (Signed)
°  Problem: Activity: Goal: Ability to tolerate increased activity will improve Outcome: Progressing   Problem: Respiratory: Goal: Ability to maintain a clear airway and adequate ventilation will improve Outcome: Progressing   Problem: Role Relationship: Goal: Method of communication will improve Outcome: Progressing   Problem: Education: Goal: Knowledge of General Education information will improve Description: Including pain rating scale, medication(s)/side effects and non-pharmacologic comfort measures Outcome: Progressing   Problem: Nutrition: Goal: Adequate nutrition will be maintained Outcome: Progressing   Problem: Coping: Goal: Level of anxiety will decrease Outcome: Progressing   Problem: Elimination: Goal: Will not experience complications related to bowel motility Outcome: Progressing Goal: Will not experience complications related to urinary retention Outcome: Progressing

## 2024-01-23 NOTE — Progress Notes (Signed)
 Heart monitor post CVA  Dr. Jeffrie to read

## 2024-01-23 NOTE — Discharge Summary (Signed)
 Physician Discharge Summary  Patient ID: Andrew Hines MRN: [id number] DOB/AGE: March 10, 1960 63 y.o.  Admit date: 01/19/2024 Discharge date: 01/23/2024  Admission Diagnoses: pulmonary nodules, stage III colon cancer  Discharge Diagnoses:  Principal Problem:   Lung nodules Active Problems:   Malignant neoplasm of ascending colon (HCC)   On mechanically assisted ventilation (HCC)   Facial droop   Hypokalemia   Hypotension   CKD stage 3a, GFR 45-59 ml/min (HCC)   Cerebrovascular accident (CVA) due to embolism of precerebral artery [facility])   Discharged Condition: good  Hospital Course: Andrew Hines is a 63 y/o gentleman with a history of colon cancer with pulmonary nodules who presented for bronchoscopy with lung nodule biopsies. During the procedure he developed hypotension under anesthesia. He developed ST elevations that were persistent after this hypotensive episode and he was transferred to the ICU for further evaluation after the procedure was aborted. He underwent emergent LHC demonstrating normal coronary arteries. He was successfully extubated in the ICU following LHC and was progressing well until he developed left facial droop associated with mild LLE weakness and LUE pronator drift a few hours later. Code stroke was called. He was not a candidate for TNK due to low NIH SS and previous lung biopsies earlier in the day as well as previous arterial puncture earlier in the day. He requiring vasopressors to maintain perfusing MAP to optimize cerebral perfusion. MRI confirmed embolic strokes. Norepi was continued for 48 hours to ensure adequate perfusion. He was weaned off NE over the following day. The day of discharge he is hypertensive and midodrine  will be stopped. He will not restart his amlodipine and hydrochlorothiazide at discharge, but his PCP appointment this should be considered. If he is restarted on hydrochlorothiazide, he should go back on a potassium supplement at that time. He had  some leaking from his IV with concern for mild extravasation where NE was given. Phentolamine  was applied topically.   For his embolic strokes- aspirin , atorvastatin, plavix  were started. He will go home with a cardiac event monitor to r.o occult Afib.   At follow up, he will bring his home BP logs in with him.  Has PCP appt 12/23.  Consults: cardiology, pulmonary/intensive care, and neurology  Significant Diagnostic Studies:     Latest Ref Rng & Units 01/23/2024    4:40 AM 01/22/2024    5:21 AM 01/21/2024    1:50 AM  BMP  Glucose 70 - 99 mg/dL 896  885  878   BUN 8 - 23 mg/dL 8  9  12    Creatinine 0.61 - 1.24 mg/dL 8.80  8.78  8.72   Sodium 135 - 145 mmol/L 135  135  136   Potassium 3.5 - 5.1 mmol/L 4.3  3.7  3.2   Chloride 98 - 111 mmol/L 106  104  103   CO2 22 - 32 mmol/L 22  23  26    Calcium  8.9 - 10.3 mg/dL 8.0  8.2  8.3     CBC    Component Value Date/Time   WBC 7.1 01/23/2024 0440   RBC 4.49 01/23/2024 0440   HGB 14.3 01/23/2024 0440   HGB 13.8 09/24/2020 0903   HCT 41.4 01/23/2024 0440   PLT 307 01/23/2024 0440   PLT 198 09/24/2020 0903   MCV 92.2 01/23/2024 0440   MCH 31.8 01/23/2024 0440   MCHC 34.5 01/23/2024 0440   RDW 12.8 01/23/2024 0440   LYMPHSABS 2.1 11/27/2023 1451   MONOABS 0.6 11/27/2023 1451  EOSABS 0.1 11/27/2023 1451   BASOSABS 0.0 11/27/2023 1451    Brain MRI 12/10:   IMPRESSION: 1. Many small acute infarcts in the right caudate, bilateral frontal, parietal, and occipital lobes. Additional single punctate acute infarcts in the left thalamus and cerebellum. Given involvement of multiple vascular territories, consider an embolic etiology.  Echocardiogram 12/10:   1. Left ventricular ejection fraction, by estimation, is 60 to 65%. The  left ventricle has normal function. The left ventricle has no regional  wall motion abnormalities. There is mild left ventricular hypertrophy of  the basal-septal segment. Left  ventricular diastolic  parameters are consistent with Grade I diastolic  dysfunction (impaired relaxation).   2. Right ventricular systolic function is normal. The right ventricular  size is normal.   3. The mitral valve is normal in structure. No evidence of mitral valve  regurgitation. No evidence of mitral stenosis.   4. The aortic valve is normal in structure. Aortic valve regurgitation is  not visualized. No aortic stenosis is present.   5. The inferior vena cava is normal in size with <50% respiratory  variability, suggesting right atrial pressure of 8 mmHg.   LHC 12/9:   The left ventricular systolic function is normal.   LV end diastolic pressure is mildly elevated.   The left ventricular ejection fraction is 55-65% by visual estimate.   Normal coronary anatomy Normal LV function LVEDP 16 mm Hg   ST changes and elevated troponin due to demand ischemia in setting of marked hypotension.      Treatments:  Bronchoscopy with biopsies-- will follow up with Dr. Shelah & Dr. Lanny LHC- no cardiac interventions requiring  Stroke care: vasopressors and midodrine  to ensure adequate brain perfusion. Did not require thrombolytics or mechanical thrombectomy. Started on DAPT & statin.    Discharge Exam: Blood pressure 111/82, pulse 87, temperature 98.3 F (36.8 C), temperature source Oral, resp. rate 18, height 6' (1.829 m), weight (P) 88.7 kg, SpO2 95%. Elderly man sitting up in bed in NAD, appears younger than stated age [place]/AT Awake, alert, moving all extremities. Facial droop left lower face is improved today.  S1S2, RRR Breathing comfortably on RA, no conversational dyspnea Abd soft, NT Erythema left forearm from previous IV  Disposition: home  Discharge Instructions     Ambulatory referral to Neurology   Complete by: As directed    Follow up with stroke clinic NP at [facility] in about 4-6 weeks. Thanks.      Allergies as of 01/23/2024   No Known Allergies      Medication List     STOP  taking these medications    amLODipine 5 MG tablet Commonly known as: NORVASC   hydrochlorothiazide 25 MG tablet Commonly known as: HYDRODIURIL   potassium chloride  SA 20 MEQ tablet Commonly known as: KLOR-CON  M   sildenafil 100 MG tablet Commonly known as: VIAGRA       TAKE these medications    aspirin  EC 81 MG tablet Take 1 tablet (81 mg total) by mouth daily. Swallow whole. Start taking on: January 24, 2024   capecitabine  500 MG tablet Commonly known as: XELODA  Take 3 tabs in morning and 4 tabs in evening for 14 days then off 7 days. Repeat every 21 days. Take within 30 minutes after meals.   clopidogrel  75 MG tablet Commonly known as: PLAVIX  Take 1 tablet (75 mg total) by mouth daily. Start taking on: January 24, 2024   diphenhydrAMINE  HCl (Sleep) 50 MG Caps Take 100  mg by mouth at bedtime.   ondansetron  8 MG tablet Commonly known as: Zofran  Take 1 tablet (8 mg total) by mouth 2 (two) times daily as needed for refractory nausea / vomiting. Start on day 3 after chemotherapy.   prochlorperazine  10 MG tablet Commonly known as: COMPAZINE  Take 1 tablet (10 mg total) by mouth every 6 (six) hours as needed (Nausea or vomiting).   rosuvastatin  20 MG tablet Commonly known as: CRESTOR  Take 1 tablet (20 mg total) by mouth daily. Start taking on: January 24, 2024   VISINE OP Place 1 drop into both eyes daily.   Vitamin D 50 MCG (2000 UT) tablet Take 2,000 Units by mouth daily.        Follow-up Information     Venson Candis CROME, NP Follow up.   Specialty: Family Medicine Why: TIME : 2:40 PM DATE : February 02, 2024 TUESDAY  PLEASE BRING ALL CURRENT MEDICATION, ID and INS CARD, CO-PAY IF PT'S NEEDS  TRANSPORTATION CALL OFFICES Contact information: [address] [place] [address] [phone]         [facility]. Schedule an appointment as soon as possible for a visit in 1 month(s).   Specialty: Neurology Why:  stroke clinic Contact information: [address] [place]  [address] [phone]                Signed: Leita SHAUNNA Hines 01/23/2024, 11:57 AM

## 2024-01-25 ENCOUNTER — Other Ambulatory Visit (HOSPITAL_COMMUNITY)

## 2024-01-25 ENCOUNTER — Ambulatory Visit (HOSPITAL_COMMUNITY)

## 2024-01-25 ENCOUNTER — Other Ambulatory Visit: Payer: Self-pay | Admitting: Cardiology

## 2024-01-25 DIAGNOSIS — I6310 Cerebral infarction due to embolism of unspecified precerebral artery: Secondary | ICD-10-CM

## 2024-01-25 DIAGNOSIS — I639 Cerebral infarction, unspecified: Secondary | ICD-10-CM

## 2024-01-26 ENCOUNTER — Ambulatory Visit: Admitting: Acute Care

## 2024-01-26 DIAGNOSIS — C182 Malignant neoplasm of ascending colon: Secondary | ICD-10-CM

## 2024-01-26 NOTE — Progress Notes (Signed)
 PATIENT NAVIGATOR PROGRESS NOTE  Name: Andrew Hines Date: 01/26/2024 MRN: [id number]  DOB: 03/06/1960   Tempus testing ordered per Dr. Demetra request.

## 2024-01-29 ENCOUNTER — Telehealth: Payer: Self-pay | Admitting: Hematology

## 2024-01-29 ENCOUNTER — Inpatient Hospital Stay: Attending: Hematology | Admitting: Hematology

## 2024-01-29 NOTE — Assessment & Plan Note (Deleted)
 [id number] stage IIIB, MMR normal -diagnosed in 05/2020. Staging work-up showed renal and liver cysts, otherwise negative for distant metastatic disease. Pre-op CEA not done. -He underwent right hemicolectomy on 06/13/20 with Dr. 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  Margins were clear. There was no perineural invasion or perforation. MMR is normal -he had adjuvant chemo CAPOX for 3 months -PET on 11/18 showed large hypermetabolic mesenteric mass and multiple small hypermetabolic lung nodules.  - He underwent bronchoscopy biopsy on January 19, 2024, which showed atypical cells, nondiagnostic.  Unfortunately he developed acute myocardial infarction during the bronchoscopy biopsy, and the shock required vasopressor.  He also had acute stroke during the hospital stay.  He is recovering well.

## 2024-02-01 ENCOUNTER — Inpatient Hospital Stay: Attending: Hematology

## 2024-02-01 ENCOUNTER — Inpatient Hospital Stay

## 2024-02-01 DIAGNOSIS — C182 Malignant neoplasm of ascending colon: Secondary | ICD-10-CM

## 2024-02-01 DIAGNOSIS — Z800 Family history of malignant neoplasm of digestive organs: Secondary | ICD-10-CM | POA: Insufficient documentation

## 2024-02-01 LAB — MISCELLANEOUS TEST

## 2024-02-01 NOTE — Progress Notes (Addendum)
 REFERRING PROVIDER: Lanny Callander, MD [address] [place],  [place] [address]  PRIMARY PROVIDER:  Health, [facility]  PRIMARY REASON FOR VISIT:  1. Malignant neoplasm of ascending colon (HCC)   2. Family history of colon cancer     HISTORY OF PRESENT ILLNESS:   Andrew Hines, a 63 y.o. male, was seen for a Andrew Hines cancer genetics consultation at the request of Callander Lanny, MD due to a personal history of prostate cancer and family history of metastatic prostate cancer. Andrew Hines presents to clinic today to discuss the possibility of a hereditary predisposition to cancer, genetic testing, and to further clarify his future cancer risks, as well as potential cancer risks for family members.   Diagnosis: In March of 2022, at the age of 45, Andrew Hines was diagnosed with malignant neoplasm of ascending colon (HCC) pT3pN1bM0 stage IIIB, MMR normal. In November 2025, he underwent a PET scan and colonoscopy. The PET scan identified a large abdominal mass and lung nodules, both positive, suggesting metastatic disease. 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  Andrew Hines shared that after his colon cancer treatment in 2022 and him he was uninsured at 53 and for that reason did not have annual colonoscopies. He shared that he was hopeful there would no recurrence and he would resume screen at once he was eligible for medicare at age 58.   CANCER HISTORY:  Oncology History Overview Note  Cancer Staging Malignant neoplasm of ascending colon [facility]) Staging form: Colon and Rectum, AJCC 8th Edition - Pathologic stage from 06/13/2020: Stage IIIB (pT3, pN1b, cM0) - Signed by Burton, [name] K, NP on 07/03/2020 Stage prefix: Initial diagnosis Histologic grading system: 4 grade system Histologic grade (G): G2 Lymph-vascular invasion (LVI): LVI present/identified, NOS Perineural invasion (PNI): Absent    Malignant neoplasm of ascending colon (HCC)  04/17/2020  Procedure   Colonoscopy at [facility], by Dr. Gwenith Shahid A mass was found in the proximal ascending colon, 1/3 circumference of the lumen, nonobstructing, ulcerated and friable multiple biopsies were performed.  A single polyp measuring 4 mm was found in the descending colon, a polypectomy was performed.  The resection was complete.  Medium size internal hemorrhoids were found.   04/17/2020 Initial Biopsy   Path : Proximal ascending colon: Invasive moderately differentiated adenocarcinoma arising in a tubular adenoma with high-grade dysplasia  Descending colon polyp: tubular adenoma   04/17/2020 Imaging   Staging CT A/P 04/17/20 at [facility]: Impression The lung bases are clear.  Focal 6 mm low-density lesion in the right lobe of the liver which does not enhance and likely represents a small cyst.  No altered areas of attenuation are identified to suggest hepatic mass.  The spleen, pancreas, adrenal glands have a normal appearance.  There are cortical cysts in both kidneys.  Both kidneys function normally.  Large amount of gas in the colon and this post colonoscopy patient.  No obvious colonic masses.  No pneumoperitoneum.  No free fluid or abdominal fluid collections.  No lytic or blastic lesions.  Right inguinal hernia filled with fat.  Impression: Renal cyst with probable hepatic cyst without definite evidence of metastatic disease.  Right inguinal hernia.   06/04/2020 Imaging   Staging CT chest IMPRESSION: 1. No evidence of metastatic disease. 2. Hepatic steatosis. 3. Aortic atherosclerosis (ICD10-I70.0). Coronary artery calcification. 4. Enlarged pulmonary arteries, indicative of pulmonary arterial hypertension.     06/13/2020 Cancer Staging   Staging form: Colon and Rectum,  AJCC 8th Edition - Pathologic stage from 06/13/2020: Stage IIIB (pT3, pN1b, cM0) - Signed by Burton, [name] K, NP on 07/03/2020 Stage prefix: Initial diagnosis Histologic grading system: 4 grade  system Histologic grade (G): G2 Lymph-vascular invasion (LVI): LVI present/identified, NOS Perineural invasion (PNI): Absent   06/13/2020 Surgery   PROCEDURE: Laparoscopic-assisted right hemicolectomy 2. Lysis of adhesions x 90 minutes 3. Partial omentectomy 4. Bilateral transversus abdominus plane blocks SURGEON: Lonni HERO. White, MD   06/13/2020 Pathology Results   FINAL MICROSCOPIC DIAGNOSIS:  A. COLON, RIGHT AND TERMINAL ILEUM, RESECTION:  - Invasive adenocarcinoma, moderately differentiated, spanning 3.8 cm.  - Tumor invades through muscularis propria into pericolonic tissue.  - Lymphovascular invasion present.  - Resection margins are negative.  - Metastatic carcinoma in two of nineteen lymph nodes (2/19).  - See oncology table.   B. OMENTUM, RESECTION:  - Benign fibroadipose tissue.   pT3pN1b   IHC EXPRESSION RESULTS  TEST           RESULT  MLH1:          Preserved nuclear expression  MSH2:          Preserved nuclear expression  MSH6:          Preserved nuclear expression  PMS2:          Preserved nuclear expression    06/13/2020 Initial Diagnosis   Malignant neoplasm of ascending colon (HCC)   07/23/2020 -  Chemotherapy   CAPOX q3weeks with Xeloda  1500mg  in the AM and 2000mg  in the PM starting 07/23/20.      RISK FACTORS:  Colonoscopy: yes; 12/22/2023      1. Surgical [P], colon, transverse and descending, polyp (3) :       - TUBULAR ADENOMA(S).       - NO HIGH GRADE DYSPLASIA OR MALIGNANCY.  Any excessive radiation exposure or other environmental exposures the past: Yes, Magnoso (chemical added to oil in [employer] - known risk lung cancer). He worked in [employer].  Tobacco Use: Former (quit in 1993) Past Medical History:  Diagnosis Date   Colon cancer (HCC) 04/17/2020   HBP (high blood pressure) 2025   Pneumonia    2020    Skin cancer, basal cell    Past Surgical History:  Procedure Laterality Date   APPENDECTOMY      COLONOSCOPY  2025   LAPAROSCOPIC RIGHT HEMI COLECTOMY N/A 06/13/2020   Procedure: LAPAROSCOPIC RIGHT HEMI COLECTOMY AND PARTIAL OMENTECTOMY WITH LYSIS OF ADHESIONS;  Surgeon: Teresa Lonni HERO, MD;  Location: [facility] ORS;  Service: General;  Laterality: N/A;   LEFT HEART CATH AND CORONARY ANGIOGRAPHY N/A 01/19/2024   Procedure: LEFT HEART CATH AND CORONARY ANGIOGRAPHY;  Surgeon: Jordan, Peter M, MD;  Location: [facility] INVASIVE CV LAB;  Service: Cardiovascular;  Laterality: N/A;   VIDEO BRONCHOSCOPY WITH ENDOBRONCHIAL NAVIGATION Bilateral 01/19/2024   Procedure: VIDEO BRONCHOSCOPY WITH ENDOBRONCHIAL NAVIGATION;  Surgeon: Shelah Lamar RAMAN, MD;  Location: [facility] ENDOSCOPY;  Service: Pulmonary;  Laterality: Bilateral;   Social History   Socioeconomic History   Marital status: Significant Other    Spouse name: Not on file   Number of children: 3   Years of education: Not on file   Highest education level: Not on file  Occupational History   Not on file  Tobacco Use   Smoking status: Former    Current packs/day: 0.00    Average packs/day: 1 pack/day for 5.0 years (5.0 ttl pk-yrs)    Types: Cigarettes  Start date: 33    Quit date: 1993    Years since quitting: 32.9   Smokeless tobacco: Never  Vaping Use   Vaping status: Never Used  Substance and Sexual Activity   Alcohol use: Not Currently    Alcohol/week: 14.0 standard drinks of alcohol    Types: 14 Shots of liquor per week    Comment: gin daily x25 years, cut down to 2 drinks a day   Drug use: Never   Sexual activity: Yes  Other Topics Concern   Not on file  Social History Narrative   Lives alone, has significant other and sibling support. 3 adult children (1 son has Crohn's). Daughter's mother had breast cancer (pt's daughter reportedly negative genetic testing). 6 paternal uncles had either prostate or colon cancers, details kept private in the family. 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      Social Drivers of Health   Tobacco Use: Medium Risk (01/19/2024)   Patient History    Smoking Tobacco Use: Former    Smokeless Tobacco Use: Never    Passive Exposure: Not on Actuary Strain: Not on file  Food Insecurity: No Food Insecurity (01/19/2024)   Epic    Worried About Programme Researcher, Broadcasting/film/video in the Last Year: Never true    Ran Out of Food in the Last Year: Never true  Transportation Needs: No Transportation Needs (01/19/2024)   Epic    Lack of Transportation (Medical): No    Lack of Transportation (Non-Medical): No  Physical Activity: Not on file  Stress: Not on file  Social Connections: Unknown (01/19/2024)   Social Connection and Isolation Panel    Frequency of Communication with Friends and Family: More than three times a week    Frequency of Social Gatherings with Friends and Family: Twice a week    Attends Religious Services: Patient declined    Database Administrator or Organizations: Patient declined    Attends Banker Meetings: Patient declined    Marital Status: Patient declined  Depression (PHQ2-9): Low Risk (12/31/2023)   Depression (PHQ2-9)    PHQ-2 Score: 0  Alcohol Screen: Not on file  Housing: Low Risk (01/19/2024)   Epic    Unable to Pay for Housing in the Last Year: No    Number of Times Moved in the Last Year: 0    Homeless in the Last Year: No  Utilities: Not At Risk (01/19/2024)   Epic    Threatened with loss of utilities: No  Health Literacy: Not on file    FAMILY HISTORY:  We obtained a detailed, 4-generation family history pasted below.   Ormand Senn is unaware of relatives completing genetic testing for hereditary cancer risks.  There is no reported Ashkenazi Jewish ancestry.   Pedigree Summary Father - Colon Cancer at 11, Lung Cancer dx. Unknown Paternal Uncle - Colon Cancer dx. Unknown  Significant diagnoses are listed below: Family History  Problem Relation Age of Onset   Colon polyps Mother     Hypertension Mother    Lung cancer Father        d. lung cancer   Alcohol abuse Father    Colon cancer Father 39   Hypertension Sister    Hypertension Brother    Colon cancer Paternal Uncle    Cirrhosis Paternal Uncle        Cause of death   Stroke Maternal Grandfather  cause of death   Alcohol abuse Maternal Grandmother        d. house fire   Dementia Paternal Uncle    HIV/AIDS Half-Brother        cause of death   GENETIC COUNSELING ASSESSMENT: Andrew Hines is a 63 y.o. male with a personal and family history of colon cancer which is somewhat suggestive of a hereditary cancer predisposition syndrome. We, therefore, discussed and recommended the following at today's visit.   DISCUSSION: We discussed that, in general, most cancer is not inherited in families, but instead is sporadic or familial. Sporadic cancers occur by chance and typically happen at older ages (>50 years) as this type of cancer is caused by genetic changes acquired during an individuals lifetime. Some families have more cancers than would be expected by chance; however, the ages or types of cancer are not consistent with a known genetic mutation or known genetic mutations have been ruled out. This type of familial cancer is thought to be due to a combination of multiple genetic, environmental, hormonal, and lifestyle factors. While this combination of factors likely increases the risk of cancer, the exact source of this risk is not currently identifiable or testable.  We discussed that 5-10% of cancer is the result of germline (heritable) genetic variants, with most cases associated with BRCA1/BRCA2. There are other genes that can be associated with hereditary cancer syndromes. These include genes associated with Lynch Syndrome, also called HNPCC (Hereditary Non-Polyposis Colon Cancer). This syndrome increases the risk for colon, uterine, ovarian and stomach cancers, as well as others. Families with Lynch Syndrome tend  to have multiple family members with these cancers, typically diagnosed under age 42, and diagnoses in multiple generations. The genes that explain the majority of Lynch Syndrome are called MLH1, MSH2, MSH6, PMS2 and EPCAM. We discussed that testing is beneficial for several reasons including knowing how to follow individuals after completing their treatment, identifying whether potential treatment options would be beneficial, and understanding if other family members could be at risk for cancer and allow them to undergo genetic testing.   We reviewed the characteristics, features and inheritance patterns of hereditary cancer syndromes. We also discussed genetic testing, including the appropriate family members to test, the process of testing, insurance coverage and turn-around-time for results. We discussed the implications of a negative, positive, carrier and/or variant of uncertain significant result. Andrew Hines  was offered a common hereditary cancer panel (40 genes) and an expanded pan-cancer panel (77 genes). Andrew Tashiro was informed of the benefits and limitations of each panel, including that expanded pan-cancer panels contain genes that do not have clear management guidelines at this point in time.  We also discussed that as the number of genes included on a panel increases, the chances of variants of uncertain significance increases.  GENETIC TESTING NATIONAL CRITERIA: Based on [facility] personal and family history of cancer he meets medical criteria for genetic testing based on the [employer] (NCCN) guidelines.   Despite that he meets criteria, he  may still have an out of pocket cost. he completed the [facility] patient assistance form in the office. We discussed that he will get a text from [facility] with billing formation and his out-of-pocket cost estimate.    GENETIC TESTING CONSENT:  After considering the risks, benefits, and limitations, Macklen Wilhoite  provided informed consent to pursue genetic testing. A blood sample was sent to [facility] for analysis of the CancerNext-Expanded+RNA Panel. Results should be available  within approximately 2-3 weeks' time, at which point they will be disclosed by telephone to Starleen Kitty , as will any additional recommendations warranted by these results. Ayaz Glander will receive a summary of her genetic counseling visit and a copy of his results once available. This information will also be available in Epic.  [facility] CancerNext-Expanded + RNAinsight gene panel which includes sequencing, rearrangement, and RNA analysis for the following 77 genes: AIP, ALK, APC, ATM, AXIN2, BAP1, BARD1, BMPR1A, BRCA1, BRCA2, BRIP1, CDC73, CDH1, CDK4, CDKN1B, CDKN2A, CEBPA, CHEK2, CTNNA1, DDX41, DICER1, ETV6, FH, FLCN, GATA2, LZTR1, MAX, MBD4, MEN1, MET, MLH1, MSH2, MSH3, MSH6, MUTYH, NF1, NF2, NTHL1, PALB2, PHOX2B, PMS2, POT1, PRKAR1A, PTCH1, PTEN, RAD51C, RAD51D, RB1, RET, RPS20, RUNX1, SDHA, SDHAF2, SDHB, SDHC, SDHD, SMAD4, SMARCA4, SMARCB1, SMARCE1, STK11, SUFU, TMEM127, TP53, TSC1, TSC2, VHL, and WT1 (sequencing and deletion/duplication); EGFR, HOXB13, KIT, MITF, PDGFRA, POLD1, and POLE (sequencing only); EPCAM and GREM1 (deletion/duplication only).   GENETIC INFORMATION NONDISCRIMINATION ACT (GINA): We discussed that some people do not want to undergo genetic testing due to fear of genetic discrimination.  The Genetic Information Nondiscrimination Act (GINA) was signed into federal law in 2008. GINA prohibits health insurers and most employers from discriminating against individuals based on genetic information (including the results of genetic tests and family history information). According to GINA, health insurance companies cannot consider genetic information to be a preexisting condition, nor can they use it to make decisions regarding coverage or rates. GINA also makes it illegal for most employers to use genetic information in  making decisions about hiring, firing, promotion, or terms of employment. It is important to note that GINA does not offer protections for life insurance, disability insurance, or long-term care insurance. GINA does not apply to those in the [employer], those who work for companies with less than 15 employees, and new life insurance or long-term disability insurance policies.  Health status due to a cancer diagnosis is not protected under GINA. More information about GINA can be found by visiting eliteclients.be. Lastly, we encouraged Starleen Kitty to remain in contact with cancer genetics annually so that we can continuously update the family history and inform him of any changes in cancer genetics and testing that may be of benefit for this family.   Audi Boxwell's questions were answered to his satisfaction today. Our contact information was provided should additional questions or concerns arise. Thank you for the referral and allowing us  to share in the care of your patient.   Resources:  Karell Tukes was provided with the following:  [facility] Billing information  [facility] Hereditary Cancer Testing Patient Guide Vaughn Banker Patient Assistance Information Sheet [facility] CancerNext-Expanded + RNAinsight gene list  PLAN:  Testing Ordered: [facility] CancerNext-Expanded + RNAinsight  Clinic Note Faxed/Routed to [facility], [address]   Ronald, MS, [facility]  Certified Genetic Counselor  Email: [email or web address]  Phone: [phone]  I personally spent a total of 60 minutes in the care of the patient today including preparing to see the patient, counseling and educating, placing orders, and documenting clinical information in the EHR.  The patient was seen alone. Drs. Lanny Stalls, and/or Gudena were available for questions, if needed. _______________________________________________________________________ For Office Staff:  Number of people involved in  session: 1 Was an Intern/ student involved with case: no

## 2024-02-08 NOTE — Progress Notes (Unsigned)
 " Cardiology Office Note   Date:  02/09/2024  ID:  Andrew Hines, Andrew Hines March 23, 1960, MRN [id number] PCP: Health, [facility]  [facility] Providers Cardiologist:  Oneil Parchment, MD     History of Present Illness Andrew Hines is a 63 y.o. male with history of hypertension, CKD stage IIIa, suspected metastatic colon cancer in abdomen and lungs, and CVA.    He was consulted by cardiology during hospitalization after severe hypotensive episode during bronchoscopy/biopsies and endoscopy. Hydrochlorothiazide and amlodipine stopped. ST segment elevation was noted and stat echo ordered. Echo 01/19/2024 LVEF 60 to 65%, normal RV, and small pericardial effusion. He developed ST elevations that were persistent after this hypotensive episode. Noteworthy K of 2.8. Patient underwent LHC that showed normal coronary anatomy, normal LV function, and LVEDP 16 mmHg. Rest determine ST changes and elevated troponin for due to demand ischemia in the setting of marked hypertension. Patient then had a code stroke called for left-sided facial droop and dysarthria. 12/10 MRI revealed multiple embolic appearing strokes. Felt to be due to hypercoagulable state from advanced cancer. Not a TNK candidate. 30-day monitor placed prior to discharge. Patient required IV pressors during hospitalization. Midodrine  DC'd at discharge. Cardiac monitor results pending to rule out a fib.     He presents today for hospital follow-up. He is currently about to start his chemotherapy treatments. He wore his heart monitor for 2 weeks before a blister formed and he had to remove it. He saw his primary care doctor and is going to move forward with a sleep study. He feels like he does snore at night. He checks his BP twice daily with readings around 120's/ mid 80's. He had dizzy spell yesterday morning, but attributed it to not having eaten yet. He notes overall feeling okay given his circumstances, other than losing 15 lbs. He denies chest pain,  shortness of breath, lower extremity edema, fatigue, palpitations, melena, hematuria, hemoptysis, diaphoresis, weakness, presyncope, syncope, orthopnea, and PND.  ROS: All systems negative unless otherwise indicated in HPI.  Studies Reviewed      Cardiac Studies & Procedures   ______________________________________________________________________________________________ CARDIAC CATHETERIZATION  CARDIAC CATHETERIZATION 01/19/2024  Conclusion   The left ventricular systolic function is normal.   LV end diastolic pressure is mildly elevated.   The left ventricular ejection fraction is 55-65% by visual estimate.  Normal coronary anatomy Normal LV function LVEDP 16 mm Hg  Plan: ST changes and elevated troponin due to demand ischemia in setting of marked hypotension.  Findings Coronary Findings Diagnostic  Dominance: Right  Left Main Vessel was injected. Vessel is normal in caliber. Vessel is angiographically normal.  Left Anterior Descending Vessel was injected. Vessel is normal in caliber. Vessel is angiographically normal.  Left Circumflex Vessel was injected. Vessel is normal in caliber. Vessel is angiographically normal.  Right Coronary Artery Vessel was injected. Vessel is normal in caliber. Vessel is angiographically normal.  Intervention  No interventions have been documented.     ECHOCARDIOGRAM  ECHOCARDIOGRAM COMPLETE 01/20/2024  Narrative ECHOCARDIOGRAM REPORT    Patient Name:   Andrew Hines Date of Exam: 01/20/2024 Medical Rec #:  [id number]      Height:       72.0 in Accession #:    [id number]     Weight:       190.0 lb Date of Birth:  1960-12-19      BSA:          2.085 m Patient Age:    63  years       BP:           119/98 mmHg Patient Gender: M              HR:           67 bpm. Exam Location:  Inpatient  Procedure: 2D Echo, Cardiac Doppler and Color Doppler (Both Spectral and Color Flow Doppler were utilized during procedure).  Indications:     Stroke  History:        Patient has prior history of Echocardiogram examinations, most recent 01/19/2024.  Sonographer:    Sherlean Dubin Referring Phys: [id number] FONDA JAYSON SHARPS  IMPRESSIONS   1. Left ventricular ejection fraction, by estimation, is 60 to 65%. The left ventricle has normal function. The left ventricle has no regional wall motion abnormalities. There is mild left ventricular hypertrophy of the basal-septal segment. Left ventricular diastolic parameters are consistent with Grade I diastolic dysfunction (impaired relaxation). 2. Right ventricular systolic function is normal. The right ventricular size is normal. 3. The mitral valve is normal in structure. No evidence of mitral valve regurgitation. No evidence of mitral stenosis. 4. The aortic valve is normal in structure. Aortic valve regurgitation is not visualized. No aortic stenosis is present. 5. The inferior vena cava is normal in size with <50% respiratory variability, suggesting right atrial pressure of 8 mmHg.  Comparison(s): No significant change from prior study.  Conclusion(s)/Recommendation(s): No intracardiac source of embolism detected on this transthoracic study. Consider a transesophageal echocardiogram to exclude cardiac source of embolism if clinically indicated.  FINDINGS Left Ventricle: Left ventricular ejection fraction, by estimation, is 60 to 65%. The left ventricle has normal function. The left ventricle has no regional wall motion abnormalities. The left ventricular internal cavity size was normal in size. There is mild left ventricular hypertrophy of the basal-septal segment. Left ventricular diastolic parameters are consistent with Grade I diastolic dysfunction (impaired relaxation).  Right Ventricle: The right ventricular size is normal. No increase in right ventricular wall thickness. Right ventricular systolic function is normal.  Left Atrium: Left atrial size was normal in size.  Right  Atrium: Right atrial size was normal in size.  Pericardium: There is no evidence of pericardial effusion.  Mitral Valve: The mitral valve is normal in structure. No evidence of mitral valve regurgitation. No evidence of mitral valve stenosis.  Tricuspid Valve: The tricuspid valve is normal in structure. Tricuspid valve regurgitation is trivial. No evidence of tricuspid stenosis.  Aortic Valve: The aortic valve is normal in structure. Aortic valve regurgitation is not visualized. No aortic stenosis is present. Aortic valve peak gradient measures 4.9 mmHg.  Pulmonic Valve: The pulmonic valve was normal in structure. Pulmonic valve regurgitation is not visualized. No evidence of pulmonic stenosis.  Aorta: The aortic root is normal in size and structure.  Venous: The inferior vena cava is normal in size with less than 50% respiratory variability, suggesting right atrial pressure of 8 mmHg.  IAS/Shunts: No atrial level shunt detected by color flow Doppler.   LEFT VENTRICLE PLAX 2D LVIDd:         4.40 cm   Diastology LVIDs:         2.90 cm   LV e' medial:    4.79 cm/s LV PW:         0.80 cm   LV E/e' medial:  10.6 LV IVS:        1.30 cm   LV e' lateral:   5.44 cm/s LVOT diam:  2.00 cm   LV E/e' lateral: 9.3 LV SV:         59 LV SV Index:   28 LVOT Area:     3.14 cm   RIGHT VENTRICLE             IVC RV Basal diam:  3.20 cm     IVC diam: 1.90 cm RV Mid diam:    2.70 cm RV S prime:     11.10 cm/s TAPSE (M-mode): 1.1 cm  LEFT ATRIUM             Index        RIGHT ATRIUM           Index LA diam:        3.00 cm 1.44 cm/m   RA Area:     14.40 cm LA Vol (A2C):   41.4 ml 19.86 ml/m  RA Volume:   31.40 ml  15.06 ml/m LA Vol (A4C):   33.4 ml 16.02 ml/m LA Biplane Vol: 38.6 ml 18.52 ml/m AORTIC VALVE AV Area (Vmax): 2.87 cm AV Vmax:        111.00 cm/s AV Peak Grad:   4.9 mmHg LVOT Vmax:      101.35 cm/s LVOT Vmean:     65.800 cm/s LVOT VTI:       0.187 m  AORTA Ao Root  diam: 3.10 cm Ao Asc diam:  3.30 cm  MITRAL VALVE MV Area (PHT): 4.71 cm    SHUNTS MV Decel Time: 161 msec    Systemic VTI:  0.19 m MV E velocity: 50.60 cm/s  Systemic Diam: 2.00 cm MV A velocity: 57.00 cm/s MV E/A ratio:  0.89  Oneil Parchment MD Electronically signed by Oneil Parchment MD Signature Date/Time: 01/20/2024/3:56:58 PM    Final          ______________________________________________________________________________________________      Risk Assessment/Calculations           Physical Exam VS:  BP 118/74 (BP Location: Right Arm, Patient Position: Sitting, Cuff Size: Normal)   Pulse 76   Ht 6' (1.829 m)   Wt 192 lb 9.6 oz (87.4 kg)   SpO2 100%   BMI 26.12 kg/m        Wt Readings from Last 3 Encounters:  02/09/24 192 lb 9.6 oz (87.4 kg)  01/22/24 (P) 195 lb 8.8 oz (88.7 kg)  01/05/24 202 lb (91.6 kg)    GEN: Well nourished, well developed in no acute distress NECK: No JVD; No carotid bruits CARDIAC: RRR, no murmurs, rubs, gallops RESPIRATORY:  Clear to auscultation without rales, wheezing or rhonchi  ABDOMEN: Soft, non-tender, non-distended EXTREMITIES:  No edema; No deformity   ASSESSMENT AND PLAN  Hypertension- Amlodipine 5 mg and hydrochlorothiazide 25 mg dc' d at hospital discharge. BP well controlled. He did have a dizzy spell yesterday morning that felt similar to when he was hypertensive. He was not able to check his BP. He attributes this to not having eaten that morning. Discussed to monitor BP at home and sitting for 5-10 minutes prior. No antihypertensives indicated at this time.   CKD stage IIIa- Last eGFR >60 01/23/24. Baseline creatinine 1.20 and stable at this time.   Metastatic colon cancer- Will start chemotherapy in the next 2 weeks. Follows with oncology.   CVA- Last LDL 60 01/20/24. Lpa 22.8 01/20/24. He wore heart monitor for 2 weeks before having allergic reaction, and removing it. He will mail monitor in today. Follow up with  neurology as scheduled. Continue rosuvastatin  20 mg, clopidogrel  75 mg, and aspirin  81 mg.        Dispo: Follow up with APP in 8 weeks.   Signed, Mardy KATHEE Pizza, FNP  "

## 2024-02-09 ENCOUNTER — Ambulatory Visit: Attending: Physician Assistant

## 2024-02-09 ENCOUNTER — Encounter: Payer: Self-pay | Admitting: Physician Assistant

## 2024-02-09 VITALS — BP 118/74 | HR 76 | Ht 72.0 in | Wt 192.6 lb

## 2024-02-09 DIAGNOSIS — I6310 Cerebral infarction due to embolism of unspecified precerebral artery: Secondary | ICD-10-CM

## 2024-02-09 DIAGNOSIS — N1831 Chronic kidney disease, stage 3a: Secondary | ICD-10-CM

## 2024-02-09 DIAGNOSIS — I10 Essential (primary) hypertension: Secondary | ICD-10-CM | POA: Diagnosis not present

## 2024-02-09 DIAGNOSIS — C182 Malignant neoplasm of ascending colon: Secondary | ICD-10-CM | POA: Diagnosis not present

## 2024-02-09 NOTE — Patient Instructions (Signed)
 Medication Instructions:  NO CHANGES *If you need a refill on your cardiac medications before your next appointment, please call your pharmacy*  Lab Work: NO LABS If you have labs (blood work) drawn today and your tests are completely normal, you will receive your results only by: MyChart Message (if you have MyChart) OR A paper copy in the mail If you have any lab test that is abnormal or we need to change your treatment, we will call you to review the results.  Testing/Procedures: NO TESTING  Follow-Up: At [facility], you and your health needs are our priority.  As part of our continuing mission to provide you with exceptional heart care, our providers are all part of one team.  This team includes your primary Cardiologist (physician) and Advanced Practice Providers or APPs (Physician Assistants and Nurse Practitioners) who all work together to provide you with the care you need, when you need it.  Your next appointment:   2 month(s)  Provider:   ANY APP   Other Instructions IF MONITOR RESULTS COME BACK NORMAL FOLLOW UP APPOINTMENT CAN BE CANCELED.

## 2024-02-10 ENCOUNTER — Other Ambulatory Visit: Payer: Self-pay

## 2024-02-24 NOTE — Progress Notes (Unsigned)
 "  [facility] Return Visit   Referring Provider Dow Ozell PARAS, PA [address] [place],  [place] [address]  Primary Care Provider Health, [facility]  Patient Profile: Andrew Hines is a 64 y.o. male who returns to the [facility] for follow-up of the problem(s) noted below.  Problem List: Stage IIIb ascending colon adenocarcinoma status post laparoscopic right hemicolectomy 06/2020; mesenteric mass and pulmonary nodules 2025 concerning for metastatic disease Chronic diarrhea Hepatic steatosis Family history of colon cancer - father, paternal uncle  History of Present Illness    Current GI Meds    Interval History   Discussed the use of AI scribe software for clinical note transcription with the patient, who gave verbal consent to proceed.  History of Present Illness Andrew Hines is a 64 year old gentleman with a past medical history noteworthy for CVA, HTN, pulmonary nodules, CKD 3 who returns to the gastroenterology office for follow-up of a history of colon cancer, chronic diarrhea and elevated liver enzymes    Fibrosis 4 Score = 2.32 (Indeterminate)       Interpretation for patients with NAFLD          <1.30       -  F0-F1 (Low risk)          1.30-2.67 -  Indeterminate           >2.67      -  F3-F4 (High risk)     Validated for ages 43-65         GI Review of Symptoms Significant for {GIROS:[id number]}. Otherwise negative.  General Review of Systems  Review of systems is significant for the pertinent positives and negatives as listed per the HPI.  Full ROS is otherwise negative.  Past Medical History   Past Medical History:  Diagnosis Date   Colon cancer (HCC) 04/17/2020   HBP (high blood pressure) 2025   Pneumonia    2020    Skin cancer, basal cell      Past Surgical History   Past Surgical History:  Procedure Laterality Date   APPENDECTOMY     COLONOSCOPY  2025   LAPAROSCOPIC RIGHT HEMI COLECTOMY N/A 06/13/2020    Procedure: LAPAROSCOPIC RIGHT HEMI COLECTOMY AND PARTIAL OMENTECTOMY WITH LYSIS OF ADHESIONS;  Surgeon: Teresa Lonni HERO, MD;  Location: [facility] ORS;  Service: General;  Laterality: N/A;   LEFT HEART CATH AND CORONARY ANGIOGRAPHY N/A 01/19/2024   Procedure: LEFT HEART CATH AND CORONARY ANGIOGRAPHY;  Surgeon: Jordan, Peter M, MD;  Location: [facility] INVASIVE CV LAB;  Service: Cardiovascular;  Laterality: N/A;   VIDEO BRONCHOSCOPY WITH ENDOBRONCHIAL NAVIGATION Bilateral 01/19/2024   Procedure: VIDEO BRONCHOSCOPY WITH ENDOBRONCHIAL NAVIGATION;  Surgeon: Shelah Lamar RAMAN, MD;  Location: [facility] ENDOSCOPY;  Service: Pulmonary;  Laterality: Bilateral;     Allergies and Medications   Allergies[1] @MEDSTODAY @  Family His   Family History  Problem Relation Age of Onset   Colon polyps Mother    Hypertension Mother    Lung cancer Father        d. lung cancer   Alcohol abuse Father    Colon cancer Father 54   Hypertension Sister    Hypertension Brother    Colon cancer Paternal Uncle    Cirrhosis Paternal Uncle        Cause of death   Stroke Maternal Grandfather        cause of death   Alcohol abuse Maternal Grandmother        d. house fire  Dementia Paternal Uncle    HIV/AIDS Half-Brother        cause of death   GI Specific Family History: {gifamhx:[id number]}   Social History   Social History[2] Andrew Hines reports that he quit smoking about 33 years ago. His smoking use included cigarettes. He started smoking about 38 years ago. He has a 5 pack-year smoking history. He has never used smokeless tobacco. He reports that he does not currently use alcohol after a past usage of about 14.0 standard drinks of alcohol per week. He reports that he does not use drugs.  Vital Signs and Physical Examination   There were no vitals filed for this visit. There is no height or weight on file to calculate BMI.    General: Well developed, well nourished, no acute distress Head: Normocephalic and atraumatic Eyes:  Sclerae anicteric, EOMI Ears: Normal auditory acuity Mouth: No deformities or lesions noted Lungs: Clear throughout to auscultation Heart: Regular rate and rhythm; No murmurs, rubs or bruits Abdomen: Soft, non tender and non distended. No masses, hepatosplenomegaly or hernias noted. Normal Bowel sounds Rectal: Musculoskeletal: Symmetrical with no gross deformities  Pulses:  Normal pulses noted Extremities: No edema or deformities noted Neurological: Alert oriented x 4, grossly nonfocal Psychological:  Alert and cooperative. Normal mood and affect   Review of Data   The following data was reviewed at the time of this encounter:   Laboratory Studies      Latest Ref Rng & Units 01/23/2024    4:40 AM 01/22/2024    5:21 AM 01/21/2024    1:50 AM  CBC  WBC 4.0 - 10.5 K/uL 7.1  8.5  9.3   Hemoglobin 13.0 - 17.0 g/dL 85.6  84.6  86.2   Hematocrit 39.0 - 52.0 % 41.4  43.4  39.9   Platelets 150 - 400 K/uL 307  324  329     No results found for: LIPASE    Latest Ref Rng & Units 01/23/2024    4:40 AM 01/22/2024    5:21 AM 01/21/2024    1:50 AM  CMP  Glucose 70 - 99 mg/dL 896  885  878   BUN 8 - 23 mg/dL 8  9  12    Creatinine 0.61 - 1.24 mg/dL 8.80  8.78  8.72   Sodium 135 - 145 mmol/L 135  135  136   Potassium 3.5 - 5.1 mmol/L 4.3  3.7  3.2   Chloride 98 - 111 mmol/L 106  104  103   CO2 22 - 32 mmol/L 22  23  26    Calcium  8.9 - 10.3 mg/dL 8.0  8.2  8.3    Lab Results  Component Value Date   TSH 1.93 11/27/2023   Lab Results  Component Value Date   ESRSEDRATE 49 (H) 11/27/2023   Lab Results  Component Value Date   IRON 113 11/27/2023   TIBC 340.2 11/27/2023   FERRITIN 406.2 (H) 11/27/2023   11/27/2023  CEA 10 AFP 3.9 Celiac panel negative  AMA negative ASMA negative ANA negative IgG 1991 GGT 127  Imaging Studies  Nuclear medicine PET scan 12/29/2023 1. Right eccentric central mesenteric mass, 5.9 x 4.0 cm, maximum SUV 9.4, compatible with  malignancy. 2. Multiple hypermetabolic pulmonary nodules, largest 9 x 7 mm in the left upper lobe with maximum SUV 3.7, suspicious for metastatic disease. 3. Atherosclerosis. 4. Indirect right inguinal hernia contains adipose tissue.  MRI abdomen 12/18/2023 1. No suspicious liver lesion. The apparent hyperdense wedge-shaped area near  the gallbladder fossa described on the prior exam corresponds to area of fatty sparing. 2. Redemonstration of irregular marginated heterogeneous lesion in the central mesentery, which is grossly unchanged since the recent prior study. This is indeterminate and differential diagnosis includes carcinoid tumor, lymphoma, desmoid tumor, sclerosing mesenteritis, among others. 3. Multiple other nonacute observations, as described above.  CT chest abdomen pelvis 12/07/2023 1. Heterogeneously enhancing mass versus lymph node in the central mesentery measures 5.6 x 4.2 cm, compatible with metastatic disease. 2. Adjacent prominent mesenteric lymph nodes suspicious for additional nodal disease involvement. 3. Bilateral pulmonary nodules measuring up to 11 mm, suspicious for metastatic disease. 4. Diffuse hepatic steatosis with a hyperdense wedge-shaped area near the gallbladder fossa, favored to reflect focal fatty sparing. Consider more definitive characterization by abdominal MRI with and without contrast. 5. Aortic atherosclerosis.    GI Procedures and Studies  Colonoscopy 12/22/2023 - Two 5 to 7 mm polyps in the descending colon and in the transverse colon, removed with a cold snare. Resected and retrieved.  - One 3 mm polyp in the transverse colon, removed with a cold biopsy forceps. Resected and retrieved.  - Patent and healthy appearing ileocolic anastomosis without evidence of recurrent mass lesion.  - Normal neoterminal ileum.  - Internal hemorrhoids.  Path: Tubular adenomas  Colonoscopy 04/17/[facility] at [facility] - A mass was found in the  proximal ascending colon, one third circumference of the lumen, nonobstructing, ulcerated and friable. - A single polyp measuring 4 mm was found in the descending colon removed with polypectomy - Medium size internal hemorrhoids  Path : Proximal ascending colon: Invasive moderately differentiated adenocarcinoma arising in a tubular adenoma with high-grade dysplasia   Descending colon polyp: tubular adenoma  Clinical Impression  It is my clinical impression that Andrew Hines is a 64 y.o. male with;  ***  Plan  *** *** *** *** ***   Planned Follow Up No follow-ups on file.  The patient or caregiver verbalized understanding of the material covered, with no barriers to understanding. All questions were answered. Patient or caregiver is agreeable with the plan outlined above.    It was a pleasure to see Andrew Hines.  If you have any questions or concerns regarding this evaluation, do not hesitate to contact me.  Inocente Hausen, MD [facility]      [1] No Known Allergies [2]  Social History Tobacco Use   Smoking status: Former    Current packs/day: 0.00    Average packs/day: 1 pack/day for 5.0 years (5.0 ttl pk-yrs)    Types: Cigarettes    Start date: 89    Quit date: 1993    Years since quitting: 33.0   Smokeless tobacco: Never  Vaping Use   Vaping status: Never Used  Substance Use Topics   Alcohol use: Not Currently    Alcohol/week: 14.0 standard drinks of alcohol    Types: 14 Shots of liquor per week    Comment: gin daily x25 years, cut down to 2 drinks a day   Drug use: Never   "

## 2024-02-25 ENCOUNTER — Ambulatory Visit: Admitting: Pediatrics

## 2024-02-25 NOTE — Assessment & Plan Note (Signed)
 [id number] stage IIIB, MMR normal -diagnosed in 05/2020. Staging work-up showed renal and liver cysts, otherwise negative for distant metastatic disease. Pre-op CEA not done. -He underwent right hemicolectomy on 06/13/20 with Dr. 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  Margins were clear. There was no perineural invasion or perforation. MMR is normal -he had adjuvant chemo CAPOX for 3 months -PET on 11/18 showed large hypermetabolic mesenteric mass and multiple small hypermetabolic lung nodules.  - He underwent bronchoscopy biopsy on January 19, 2024, which showed atypical cells, nondiagnostic.  Unfortunately he developed acute myocardial infarction during the bronchoscopy biopsy, and shock required vasopressor.  He also had acute stroke during the hospital stay.  He is recovering well.

## 2024-02-26 ENCOUNTER — Inpatient Hospital Stay

## 2024-02-26 ENCOUNTER — Inpatient Hospital Stay: Attending: Hematology | Admitting: Hematology

## 2024-02-26 ENCOUNTER — Other Ambulatory Visit: Payer: Self-pay

## 2024-02-26 VITALS — BP 136/80 | HR 92 | Temp 98.0°F | Resp 17 | Ht 72.0 in | Wt 193.3 lb

## 2024-02-26 DIAGNOSIS — C182 Malignant neoplasm of ascending colon: Secondary | ICD-10-CM

## 2024-02-26 NOTE — Progress Notes (Signed)
 " [facility]   Telephone:([phone] Fax:([phone]   Clinic Follow up Note   Patient Care Team: Health, [facility] as PCP - General Andrew, Oneil BROCKS, MD as PCP - Cardiology (Cardiology) Ann Andrew POUR, NP as Nurse Practitioner (Oncology) Andrew Callander, MD as Consulting Physician (Hematology and Oncology) Andrew Lonni HERO, MD as Consulting Physician (General Surgery) Andrew Maudlin, MD as Referring Physician (Gastroenterology) Andrew Evalene CROME, RN as Oncology Nurse Navigator  Date of Service:  02/26/2024  CHIEF COMPLAINT: f/u of colon cancer  CURRENT THERAPY:  Pending  Oncology History   Malignant neoplasm of ascending colon (HCC) pT3pN1bM0 stage IIIB, MMR normal -diagnosed in 05/2020. Staging work-up showed renal and liver cysts, otherwise negative for distant metastatic disease. Pre-op CEA not done. -He underwent right hemicolectomy on 06/13/20 with Dr. 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  Margins were clear. There was no perineural invasion or perforation. MMR is normal -he had adjuvant chemo CAPOX for 3 months -PET on 11/18 showed large hypermetabolic mesenteric mass and multiple small hypermetabolic lung nodules.  - He underwent bronchoscopy biopsy on January 19, 2024, which showed atypical cells, nondiagnostic.  Unfortunately he developed acute myocardial infarction during the bronchoscopy biopsy, and shock required vasopressor.  He also had acute stroke during the hospital stay.  He is recovering well.  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 Disease course appears indolent and he remains asymptomatic. The abdominal mass has increased in size but was not  biopsied due to elevated surgical risk following recent cerebrovascular accident and possible myocardial infarction. Prognosis for stage IV disease was discussed: median survival with treatment is approximately 2 years, and 5-year survival is around 10%. Given multi-organ involvement, curative potential is low, though aggressive local therapy may offer a small chance. Systemic therapy options are limited by recent vascular events; oral chemotherapy is preferred initially if metastatic disease is confirmed. Local therapies (surgery, SBRT) may be considered if disease is limited and resectable, but current distribution makes cure unlikely. Radiation may be considered for palliation if chemotherapy is contraindicated. Referral to outside surgical oncology was discussed if resection is deemed feasible. - Ordered CT scan of chest, abdomen, and pelvis in 1-2 weeks to assess for interval growth of lesions. - Planned blood draw for circulating tumor DNA testing (Guardian 360, Guardian Review) to compare with prior tumor tissue and assess for recurrence. - Instructed him to return for lab work on Monday if unable to complete blood draw today. - Advised him to request copies of prior PET and CT scans from October/November 2025 for outside review and potential surgical consultation. - Discussed referral to surgical oncology at outside centers [facility], [facility], [facility]) if outside surgeons consider resection feasible; will refer upon request. - Scheduled follow-up in 2 weeks to review imaging and blood test results and determine further management. - If metastatic disease is confirmed, oral chemotherapy is preferred initially due to recent stroke and possible myocardial infarction; intravenous chemotherapy may be considered after further recovery. - Local therapies (surgery, SBRT) may be considered if disease is limited and resectable, but current distribution makes cure unlikely; radiation may be considered for  palliation if chemotherapy is contraindicated.  Plan - Will repeat a CT chest, abdomen pelvis for restaging, since the last  scan was 2 months ago - Will obtain Guardant360 and guardian reveal next Monday -NGS [employer]us was ordered last month, results still pending due to the delay of tissue, I asked my nurse to call the pathology lab. - Follow-up in 2 weeks to review the above test results.   SUMMARY OF ONCOLOGIC HISTORY: Oncology History Overview Note  Cancer Staging Malignant neoplasm of ascending colon [facility]) Staging form: Colon and Rectum, AJCC 8th Edition - Pathologic stage from 06/13/2020: Stage IIIB (pT3, pN1b, cM0) - Signed by Andrew, [name] K, NP on 07/03/2020 Stage prefix: Initial diagnosis Histologic grading system: 4 grade system Histologic grade (G): G2 Lymph-vascular invasion (LVI): LVI present/identified, NOS Perineural invasion (PNI): Absent    Malignant neoplasm of ascending colon (HCC)  04/17/2020 Procedure   Colonoscopy at [facility], by Dr. Gwenith Hines A mass was found in the proximal ascending colon, 1/3 circumference of the lumen, nonobstructing, ulcerated and friable multiple biopsies were performed.  A single polyp measuring 4 mm was found in the descending colon, a polypectomy was performed.  The resection was complete.  Medium size internal hemorrhoids were found.   04/17/2020 Initial Biopsy   Path : Proximal ascending colon: Invasive moderately differentiated adenocarcinoma arising in a tubular adenoma with high-grade dysplasia  Descending colon polyp: tubular adenoma   04/17/2020 Imaging   Staging CT A/P 04/17/20 at [facility]: Impression The lung bases are clear.  Focal 6 mm low-density lesion in the right lobe of the liver which does not enhance and likely represents a small cyst.  No altered areas of attenuation are identified to suggest hepatic mass.  The spleen, pancreas, adrenal glands have a normal appearance.  There are cortical cysts in both  kidneys.  Both kidneys function normally.  Large amount of gas in the colon and this post colonoscopy patient.  No obvious colonic masses.  No pneumoperitoneum.  No free fluid or abdominal fluid collections.  No lytic or blastic lesions.  Right inguinal hernia filled with fat.  Impression: Renal cyst with probable hepatic cyst without definite evidence of metastatic disease.  Right inguinal hernia.   06/04/2020 Imaging   Staging CT chest IMPRESSION: 1. No evidence of metastatic disease. 2. Hepatic steatosis. 3. Aortic atherosclerosis (ICD10-I70.0). Coronary artery calcification. 4. Enlarged pulmonary arteries, indicative of pulmonary arterial hypertension.     06/13/2020 Cancer Staging   Staging form: Colon and Rectum, AJCC 8th Edition - Pathologic stage from 06/13/2020: Stage IIIB (pT3, pN1b, cM0) - Signed by Andrew, [name] K, NP on 07/03/2020 Stage prefix: Initial diagnosis Histologic grading system: 4 grade system Histologic grade (G): G2 Lymph-vascular invasion (LVI): LVI present/identified, NOS Perineural invasion (PNI): Absent   06/13/2020 Surgery   PROCEDURE: Laparoscopic-assisted right hemicolectomy 2. Lysis of adhesions x 90 minutes 3. Partial omentectomy 4. Bilateral transversus abdominus plane blocks SURGEON: Lonni Hines. White, MD   06/13/2020 Pathology Results   FINAL MICROSCOPIC DIAGNOSIS:  A. COLON, RIGHT AND TERMINAL ILEUM, RESECTION:  - Invasive adenocarcinoma, moderately differentiated, spanning 3.8 cm.  - Tumor invades through muscularis propria into pericolonic tissue.  - Lymphovascular invasion present.  - Resection margins are negative.  - Metastatic carcinoma in two of nineteen lymph nodes (2/19).  - See oncology table.   B. OMENTUM, RESECTION:  - Benign fibroadipose tissue.   pT3pN1b   IHC EXPRESSION RESULTS  TEST           RESULT  MLH1:          Preserved nuclear expression  MSH2:  Preserved nuclear expression  MSH6:          Preserved  nuclear expression  PMS2:          Preserved nuclear expression    06/13/2020 Initial Diagnosis   Malignant neoplasm of ascending colon (HCC)   07/23/2020 -  Chemotherapy   CAPOX q3weeks with Xeloda  1500mg  in the AM and 2000mg  in the PM starting 07/23/20.        Discussed the use of AI scribe software for clinical note transcription with the patient, who gave verbal consent to proceed.  History of Present Illness Andrew Hines is a 64 year old male with presumed metastatic colon cancer who presents for oncology follow-up and further diagnostic evaluation of indeterminate lung and abdominal lesions.  Imaging in October and November 2025 showed two left lung lesions (upper and lower lobes) and a mesenteric abdominal mass concerning for metastatic recurrence after prior colon cancer resection. Lung biopsies on January 19, 2024, yielded atypical cells but were nondiagnostic. The abdominal mass has not been biopsied. He is asymptomatic without cough, chest discomfort, or dyspnea.  After the lung biopsies, he was hospitalized for a stroke. He reports no new neurologic symptoms and no chest pain, palpitations, or dyspnea since then. He completed a cardiology evaluation with ambulatory monitoring and developed localized blisters at the monitor site. He has not yet seen neurology following the stroke.  He received capecitabine  in 2022. Current medications are aspirin , escitalopram, and a cholesterol-lowering agent. He is not taking prochlorperazine  or vitamin D.     All other systems were reviewed with the patient and are negative.  MEDICAL HISTORY:  Past Medical History:  Diagnosis Date   Colon cancer (HCC) 04/17/2020   HBP (high blood pressure) 2025   Pneumonia    2020    Skin cancer, basal cell     SURGICAL HISTORY: Past Surgical History:  Procedure Laterality Date   APPENDECTOMY     COLONOSCOPY  2025   LAPAROSCOPIC RIGHT HEMI COLECTOMY N/A 06/13/2020   Procedure: LAPAROSCOPIC  RIGHT HEMI COLECTOMY AND PARTIAL OMENTECTOMY WITH LYSIS OF ADHESIONS;  Surgeon: Andrew Lonni HERO, MD;  Location: [facility] ORS;  Service: General;  Laterality: N/A;   LEFT HEART CATH AND CORONARY ANGIOGRAPHY N/A 01/19/2024   Procedure: LEFT HEART CATH AND CORONARY ANGIOGRAPHY;  Surgeon: Jordan, Peter M, MD;  Location: [facility] INVASIVE CV LAB;  Service: Cardiovascular;  Laterality: N/A;   VIDEO BRONCHOSCOPY WITH ENDOBRONCHIAL NAVIGATION Bilateral 01/19/2024   Procedure: VIDEO BRONCHOSCOPY WITH ENDOBRONCHIAL NAVIGATION;  Surgeon: Shelah Lamar RAMAN, MD;  Location: [facility] ENDOSCOPY;  Service: Pulmonary;  Laterality: Bilateral;    I have reviewed the social history and family history with the patient and they are unchanged from previous note.  ALLERGIES:  has no known allergies.  MEDICATIONS:  Current Outpatient Medications  Medication Sig Dispense Refill   aspirin  EC 81 MG tablet Take 1 tablet (81 mg total) by mouth daily. Swallow whole. 30 tablet 12   capecitabine  (XELODA ) 500 MG tablet Take 3 tabs in morning and 4 tabs in evening for 14 days then off 7 days. Repeat every 21 days. Take within 30 minutes after meals. (Patient not taking: Reported on 02/09/2024) 84 tablet 0   clopidogrel  (PLAVIX ) 75 MG tablet Take 1 tablet (75 mg total) by mouth daily. 30 tablet 12   diphenhydrAMINE  HCl, Sleep, 50 MG CAPS Take 100 mg by mouth at bedtime.     escitalopram (LEXAPRO) 10 MG tablet Take 10 mg by mouth daily.  ondansetron  (ZOFRAN ) 8 MG tablet Take 1 tablet (8 mg total) by mouth 2 (two) times daily as needed for refractory nausea / vomiting. Start on day 3 after chemotherapy. (Patient not taking: Reported on 02/09/2024) 30 tablet 1   prochlorperazine  (COMPAZINE ) 10 MG tablet Take 1 tablet (10 mg total) by mouth every 6 (six) hours as needed (Nausea or vomiting). (Patient not taking: Reported on 02/09/2024) 30 tablet 1   rosuvastatin  (CRESTOR ) 20 MG tablet Take 1 tablet (20 mg total) by mouth daily. 30 tablet 12    Tetrahydrozoline HCl (VISINE OP) Place 1 drop into both eyes daily.     No current facility-administered medications for this visit.    PHYSICAL EXAMINATION: ECOG PERFORMANCE STATUS: 1 - Symptomatic but completely ambulatory  Vitals:   02/26/24 0844 02/26/24 0847  BP: (!) 140/92 136/80  Pulse: 92   Resp: 17   Temp: 98 F (36.7 C)   SpO2: 99%    Wt Readings from Last 3 Encounters:  02/26/24 193 lb 4.8 oz (87.7 kg)  02/09/24 192 lb 9.6 oz (87.4 kg)  01/22/24 (P) 195 lb 8.8 oz (88.7 kg)     GENERAL:alert, no distress and comfortable SKIN: skin color, texture, turgor are normal, no rashes or significant lesions EYES: normal, Conjunctiva are pink and non-injected, sclera clear NECK: supple, thyroid  normal size, non-tender, without nodularity LYMPH:  no palpable lymphadenopathy in the cervical, axillary  LUNGS: clear to auscultation and percussion with normal breathing effort HEART: regular rate & rhythm and no murmurs and no lower extremity edema ABDOMEN:abdomen soft, non-tender and normal bowel sounds Musculoskeletal:no cyanosis of digits and no clubbing  NEURO: alert & oriented x 3 with fluent speech, no focal motor/sensory deficits  Physical Exam    LABORATORY DATA:  I have reviewed the data as listed    Latest Ref Rng & Units 01/23/2024    4:40 AM 01/22/2024    5:21 AM 01/21/2024    1:50 AM  CBC  WBC 4.0 - 10.5 Hines/uL 7.1  8.5  9.3   Hemoglobin 13.0 - 17.0 g/dL 85.6  84.6  86.2   Hematocrit 39.0 - 52.0 % 41.4  43.4  39.9   Platelets 150 - 400 Hines/uL 307  324  329         Latest Ref Rng & Units 01/23/2024    4:40 AM 01/22/2024    5:21 AM 01/21/2024    1:50 AM  CMP  Glucose 70 - 99 mg/dL 896  885  878   BUN 8 - 23 mg/dL 8  9  12    Creatinine 0.61 - 1.24 mg/dL 8.80  8.78  8.72   Sodium 135 - 145 mmol/L 135  135  136   Potassium 3.5 - 5.1 mmol/L 4.3  3.7  3.2   Chloride 98 - 111 mmol/L 106  104  103   CO2 22 - 32 mmol/L 22  23  26    Calcium  8.9 - 10.3 mg/dL  8.0  8.2  8.3       RADIOGRAPHIC STUDIES: I have personally reviewed the radiological images as listed and agreed with the findings in the report. No results found.    Orders Placed This Encounter  Procedures   CT CHEST ABDOMEN PELVIS W CONTRAST    Standing Status:   Future    Expected Date:   03/04/2024    Expiration Date:   02/25/2025    If indicated for the ordered procedure, I authorize the administration of contrast media per  Radiology protocol:   Yes    Does the patient have a contrast media/X-ray dye allergy?:   No    Preferred imaging location?:   [facility]    If indicated for the ordered procedure, I authorize the administration of oral contrast media per Radiology protocol:   Yes   All questions were answered. The patient knows to call the clinic with any problems, questions or concerns. No barriers to learning was detected. The total time spent in the appointment was 40 minutes, including review of chart and various tests results, discussions about plan of care and coordination of care plan     Onita Mattock, MD 02/26/2024     "

## 2024-02-26 NOTE — Progress Notes (Signed)
 Verbal order w/readback order from Dr. Lanny for Guardant Reveal & Guardant 360 to be drawn on 02/29/2024. Order placed in EPIC and in Guardant portal.  Requisition given to [facility] Lab Receptionist.

## 2024-02-29 ENCOUNTER — Inpatient Hospital Stay

## 2024-02-29 DIAGNOSIS — C182 Malignant neoplasm of ascending colon: Secondary | ICD-10-CM

## 2024-02-29 NOTE — Progress Notes (Signed)
 PATIENT NAVIGATOR PROGRESS NOTE  Name: Andrew Hines Date: 02/29/2024 MRN: [id number]  DOB: Jul 21, 1960  Patient is established with a treatment plan and is actively engaged in care. Nurse Navigator services not currently indicated at this time. Will re-evaluate if needs change or if additional support is requested.

## 2024-03-07 ENCOUNTER — Ambulatory Visit (HOSPITAL_COMMUNITY)

## 2024-03-08 ENCOUNTER — Encounter: Payer: Self-pay | Admitting: Hematology

## 2024-03-09 LAB — GUARDANT 360

## 2024-03-14 ENCOUNTER — Inpatient Hospital Stay: Admitting: Hematology

## 2024-03-14 NOTE — Assessment & Plan Note (Addendum)
 pT3pN1bM0 stage IIIB, MMR normal -diagnosed in 05/2020. Staging work-up showed renal and liver cysts, otherwise negative for distant metastatic disease. Pre-op CEA not done. -He underwent right hemicolectomy on 06/13/20 with Dr. 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  Margins were clear. There was no perineural invasion or perforation. MMR is normal -he had adjuvant chemo CAPOX for 3 months -PET on 11/18 showed large hypermetabolic mesenteric mass and multiple small hypermetabolic lung nodules.  - He underwent bronchoscopy biopsy on January 19, 2024, which showed atypical cells, nondiagnostic.  Unfortunately he developed acute myocardial infarction during the bronchoscopy biopsy, and shock required vasopressor.  He also had acute stroke during the hospital stay.  He is recovering well.

## 2024-03-15 ENCOUNTER — Other Ambulatory Visit: Payer: Self-pay

## 2024-03-15 ENCOUNTER — Inpatient Hospital Stay: Attending: Hematology | Admitting: Nurse Practitioner

## 2024-03-15 ENCOUNTER — Inpatient Hospital Stay: Attending: Hematology

## 2024-03-15 VITALS — BP 109/89 | HR 79 | Temp 97.2°F | Resp 17 | Wt 193.0 lb

## 2024-03-15 DIAGNOSIS — C182 Malignant neoplasm of ascending colon: Secondary | ICD-10-CM

## 2024-03-15 LAB — CBC WITH DIFFERENTIAL (CANCER CENTER ONLY)
Abs Immature Granulocytes: 0.01 10*3/uL (ref 0.00–0.07)
Basophils Absolute: 0 10*3/uL (ref 0.0–0.1)
Basophils Relative: 1 %
Eosinophils Absolute: 0.2 10*3/uL (ref 0.0–0.5)
Eosinophils Relative: 3 %
HCT: 45.6 % (ref 39.0–52.0)
Hemoglobin: 15.4 g/dL (ref 13.0–17.0)
Immature Granulocytes: 0 %
Lymphocytes Relative: 29 %
Lymphs Abs: 2.2 10*3/uL (ref 0.7–4.0)
MCH: 30.7 pg (ref 26.0–34.0)
MCHC: 33.8 g/dL (ref 30.0–36.0)
MCV: 90.8 fL (ref 80.0–100.0)
Monocytes Absolute: 0.6 10*3/uL (ref 0.1–1.0)
Monocytes Relative: 8 %
Neutro Abs: 4.4 10*3/uL (ref 1.7–7.7)
Neutrophils Relative %: 59 %
Platelet Count: 276 10*3/uL (ref 150–400)
RBC: 5.02 MIL/uL (ref 4.22–5.81)
RDW: 13.2 % (ref 11.5–15.5)
WBC Count: 7.4 10*3/uL (ref 4.0–10.5)
nRBC: 0 % (ref 0.0–0.2)

## 2024-03-15 LAB — CMP (CANCER CENTER ONLY)
ALT: 9 U/L (ref 0–44)
AST: 32 U/L (ref 15–41)
Albumin: 4.2 g/dL (ref 3.5–5.0)
Alkaline Phosphatase: 121 U/L (ref 38–126)
Anion gap: 11 (ref 5–15)
BUN: 12 mg/dL (ref 8–23)
CO2: 25 mmol/L (ref 22–32)
Calcium: 9.3 mg/dL (ref 8.9–10.3)
Chloride: 101 mmol/L (ref 98–111)
Creatinine: 1.18 mg/dL (ref 0.61–1.24)
GFR, Estimated: >60 mL/min
Glucose, Bld: 100 mg/dL — ABNORMAL HIGH (ref 70–99)
Potassium: 3.8 mmol/L (ref 3.5–5.1)
Sodium: 137 mmol/L (ref 135–145)
Total Bilirubin: 0.3 mg/dL (ref 0.0–1.2)
Total Protein: 7.8 g/dL (ref 6.5–8.1)

## 2024-03-15 LAB — CEA (ACCESS): CEA (CHCC): 21.65 ng/mL — ABNORMAL HIGH (ref 0.00–5.00)

## 2024-03-15 NOTE — Progress Notes (Signed)
 Spoke with Tammy in [facility] Pathology Dept regarding Case# [id number] for [employer].  Tammy stated that block A-5 was sent to [facility] on 02/26/2024 but was told by [facility] that the tissue was insufficient; therefore, block A-4 was sent to [facility] on 03/08/2024.  Tammy stated d/t the inclement weather the new sample was most likely delayed in being shipped to [facility].

## 2024-03-17 ENCOUNTER — Other Ambulatory Visit: Payer: Self-pay

## 2024-03-17 ENCOUNTER — Other Ambulatory Visit (HOSPITAL_COMMUNITY): Payer: Self-pay

## 2024-03-17 ENCOUNTER — Telehealth: Payer: Self-pay

## 2024-03-17 ENCOUNTER — Telehealth: Payer: Self-pay | Admitting: Pharmacist

## 2024-03-17 DIAGNOSIS — C182 Malignant neoplasm of ascending colon: Secondary | ICD-10-CM

## 2024-03-17 MED ORDER — CAPECITABINE 500 MG PO TABS
1000.0000 mg/m2 | ORAL_TABLET | Freq: Two times a day (BID) | ORAL | 0 refills | Status: AC
  Filled 2024-03-17: qty 112, 21d supply, fill #0

## 2024-03-17 NOTE — Telephone Encounter (Signed)
 Oral Oncology Pharmacist Encounter  Received new prescription for Xeloda  (capecitabine ) for the treatment of metastatic colon cancer, planned duration until disease progression or unacceptable drug toxicity.  Patient previously received treatment with CAPOX for 3 months in 2022.  CBC w/ Diff and CMP from 03/15/24 assessed, no relevant lab abnormalities requiring baseline dose adjustment required at this time. Prescription dose and frequency assessed for appropriateness.  Current medication list in Epic reviewed, DDIs with Xeloda  identified: Category C drug-drug interaction between Xeloda  and Lexapro due to risk of Qtc prolongation with concomitant use of agents. - no changes in therapy required at this time. Patient has EKG available from 01/20/24 and QTcB 475 ms at that time. Due to patient having history of prolonged Qtc on most recent EKG, would recommend considering follow up EKG after patient has started Xeloda .   Evaluated chart and no patient barriers to medication adherence noted.   Patient agreement for treatment documented in MD note on 03/15/24.  Prescription has been e-scribed to the [facility] for benefits analysis and approval.  Oral Oncology Clinic will continue to follow for insurance authorization, copayment issues, initial counseling and start date.  Andrew Hines, PharmD, BCPS, BCOP Hematology/Oncology Clinical Pharmacist [phone] 03/17/2024 10:37 AM

## 2024-03-17 NOTE — Progress Notes (Signed)
 Oral Chemotherapy Pharmacist Encounter  I spoke with patient for overview of: Xeloda  (capecitabine ) for the treatment of metastatic colon cancer, planned duration until disease progression or unacceptable drug toxicity.   Treatment goal: Control  Counseled patient on administration, dosing, side effects, monitoring, drug-food interactions, safe handling, storage, and disposal.  Patient will take Xeloda  500mg  tablets, 4 tablets (2000mg ) by mouth in AM and 4 tabs (2000mg ) by mouth in PM, within 30 minutes of finishing meals, for 14 days on, 7 days off, repeated every 21 days.  Xeloda  start date: 03/21/24  Adverse effects include but are not limited to: fatigue, decreased blood counts, GI upset, diarrhea, mouth sores, and hand-foot syndrome. Hand-foot syndrome: discussed use of cream such as [place] Smooth Extra Care 20 or equivalent advanced care cream that has 20% urea content for advanced skin hydration while on Xeloda . Additionally discussed use of OTC Voltaren gel for HFS prophylaxis. Discussed recommended use of Voltaren gel is 1 finger tip application for front/backside of hands and then 1 fingertip application to bottoms of feet twice a day for up to 12 weeks.  Diarrhea: Patient will obtain Imodium (loperamide) to have on hand if they experience diarrhea. Patient knows to alert the office of 4 or more loose stools above baseline.  Reviewed with patient importance of keeping a medication schedule and plan for any missed doses. No barriers to medication adherence identified.  Medication reconciliation performed and medication/allergy list updated. Lexapro - discussed potential for Qtc prolongation with concomitant use of Xeloda  and Lexapro. Patient knows to see emergency help if he ever experiences s/sx of chest pain.   Distress thermometer flowsheet: Distress thermometer not completed during telephone call as patient has been on previous lines of therapy.   Communication and Learning  Assessment Primary learner: Patient Barriers to learning: No barriers Preferred language: English Learning preferences: Listening Reading  All questions answered.  Mr. Ursua voiced understanding and appreciation.   Medication education handout placed in mail for patient. Patient knows to call the office with questions or concerns. Oral Chemotherapy Clinic phone number provided to patient.   Asberry Macintosh, PharmD, BCPS, BCOP Hematology/Oncology Clinical Pharmacist [phone] 03/17/2024 2:13 PM

## 2024-03-17 NOTE — Telephone Encounter (Signed)
 Oral Oncology Patient Advocate Encounter  After completing a benefits investigation, prior authorization for Capecitabine  is not required at this time through [facility].  Patient's copay is $12.71      Charlott Hamilton,  CPhT-Adv  she/her/hers [facility]  [facility] Pharmacy Technician Patient Advocate Specialist III [facility] Phone: [phone]  Fax: [phone] [email or web address]

## 2024-03-17 NOTE — Patient Instructions (Signed)
 [facility] [place] - A DEPT OF [place]  Thank you for choosing [facility] to provide your oncology/hematology care and for allowing us  to participate in your care today!  As a reminder, we spoke about the following today: Xeloda  (capecitabine ) for the treatment of metastatic colon cancer, planned duration until disease progression or unacceptable drug toxicity.   Treatment goal: Control  Medication handout has been provided.   **For oral cancer medication prescription refill requests, contact your pharmacy and they will contact our office if needed. Allow 5-7 days for refills to be completed by your specialty pharmacy.    Cancer Center General Instructions:  If you have an appointment at the [facility], please go directly to the Cancer Center and check in at the registration area.  We strive to give you quality time with your provider. You may need to reschedule your appointment if you arrive late (15 or more minutes).  Arriving late affects you and other patients whose appointments are after yours.  Also, if you miss three or more appointments without notifying the office, you may be dismissed from the clinic at the provider's discretion.      BELOW ARE SYMPTOMS THAT SHOULD BE REPORTED IMMEDIATELY: *FEVER GREATER THAN 100.4 F (38 C) OR HIGHER *CHILLS OR SWEATING *NAUSEA AND VOMITING THAT IS NOT CONTROLLED WITH YOUR NAUSEA MEDICATION *UNUSUAL SHORTNESS OF BREATH *UNUSUAL BRUISING OR BLEEDING *URINARY PROBLEMS (pain or burning when urinating, or frequent urination) *BOWEL PROBLEMS (unusual diarrhea, constipation, pain near the anus) TENDERNESS IN MOUTH AND THROAT WITH OR WITHOUT PRESENCE OF ULCERS (sore throat, sores in mouth, or a toothache) UNUSUAL RASH, SWELLING OR PAIN  UNUSUAL VAGINAL DISCHARGE OR ITCHING   Items with * indicate a potential emergency and should be followed up as soon as possible or go to the Emergency Department if  any problems should occur.  Please show the CHEMOTHERAPY ALERT CARD at check-in to the Emergency Department and triage nurse.  Should you have questions after your visit or need to cancel or reschedule your appointment, please contact [facility] [place] - A DEPT OF [facility]  [phone] and follow the prompts.  Office hours are 8:00 a.m. to 4:30 p.m. Monday - Friday. Please note that voicemails left after 4:00 p.m. may not be returned until the following business day.  We are closed weekends and major holidays. You have access to a nurse at all times for urgent questions. Please call the main number to the clinic [phone] and follow the prompts.  For any non-urgent questions, you may also contact your provider using MyChart. We now offer e-Visits for anyone 43 and older to request care online for non-urgent symptoms. For details visit [email or web address].   Also download the MyChart app! Go to the app store, search MyChart, open the app, select [place], and log in with your MyChart username and password.

## 2024-03-17 NOTE — Progress Notes (Signed)
 Oral Chemotherapy Pharmacist Encounter  Patient was counseled under telephone encounter from 03/17/24.  Asberry Macintosh, PharmD, BCPS, BCOP Hematology/Oncology Clinical Pharmacist [phone] 03/17/2024 2:20 PM

## 2024-03-17 NOTE — Progress Notes (Signed)
 Specialty Pharmacy Initial Fill Coordination Note  Andrew Hines is a 64 y.o. male contacted today regarding refills of specialty medication(s) Capecitabine  (XELODA ) .  Patient requested [name] at [facility] [place]  on 03/18/24   Medication will be filled on 03/17/2024.   Patient is aware of $12.71 copayment.

## 2024-03-18 ENCOUNTER — Other Ambulatory Visit (HOSPITAL_COMMUNITY): Payer: Self-pay

## 2024-03-21 ENCOUNTER — Ambulatory Visit (HOSPITAL_COMMUNITY)

## 2024-03-30 ENCOUNTER — Inpatient Hospital Stay: Admitting: Hematology

## 2024-04-05 ENCOUNTER — Ambulatory Visit: Admitting: Nurse Practitioner

## 2024-05-02 ENCOUNTER — Inpatient Hospital Stay: Admitting: Diagnostic Neuroimaging
